# Patient Record
Sex: Female | Born: 1975 | Hispanic: No | Marital: Single | Smoking: Never smoker
Health system: Southern US, Community
[De-identification: ages and names within clinical notes are randomized; demographics above are authoritative.]

## PROBLEM LIST (undated history)

## (undated) DIAGNOSIS — E079 Disorder of thyroid, unspecified: Secondary | ICD-10-CM

## (undated) DIAGNOSIS — F329 Major depressive disorder, single episode, unspecified: Secondary | ICD-10-CM

## (undated) DIAGNOSIS — T7840XA Allergy, unspecified, initial encounter: Secondary | ICD-10-CM

## (undated) DIAGNOSIS — Z6841 Body Mass Index (BMI) 40.0 and over, adult: Secondary | ICD-10-CM

## (undated) DIAGNOSIS — G4733 Obstructive sleep apnea (adult) (pediatric): Secondary | ICD-10-CM

## (undated) DIAGNOSIS — D649 Anemia, unspecified: Secondary | ICD-10-CM

## (undated) DIAGNOSIS — I517 Cardiomegaly: Secondary | ICD-10-CM

## (undated) DIAGNOSIS — F32A Depression, unspecified: Secondary | ICD-10-CM

## (undated) DIAGNOSIS — I776 Arteritis, unspecified: Secondary | ICD-10-CM

## (undated) DIAGNOSIS — M419 Scoliosis, unspecified: Secondary | ICD-10-CM

## (undated) DIAGNOSIS — R030 Elevated blood-pressure reading, without diagnosis of hypertension: Secondary | ICD-10-CM

## (undated) DIAGNOSIS — G43709 Chronic migraine without aura, not intractable, without status migrainosus: Secondary | ICD-10-CM

## (undated) HISTORY — DX: Major depressive disorder, single episode, unspecified: F32.9

## (undated) HISTORY — DX: Depression, unspecified: F32.A

## (undated) HISTORY — PX: BARIATRIC SURGERY: SHX1103

## (undated) HISTORY — DX: Obstructive sleep apnea (adult) (pediatric): G47.33

## (undated) HISTORY — DX: Arteritis, unspecified: I77.6

## (undated) HISTORY — DX: Allergy, unspecified, initial encounter: T78.40XA

## (undated) HISTORY — DX: Elevated blood-pressure reading, without diagnosis of hypertension: R03.0

## (undated) HISTORY — DX: Morbid (severe) obesity due to excess calories: E66.01

## (undated) HISTORY — DX: Chronic migraine without aura, not intractable, without status migrainosus: G43.709

## (undated) HISTORY — DX: Body Mass Index (BMI) 40.0 and over, adult: Z684

## (undated) HISTORY — DX: Anemia, unspecified: D64.9

## (undated) HISTORY — DX: Disorder of thyroid, unspecified: E07.9

## (undated) HISTORY — PX: HERNIA REPAIR: SHX51

## (undated) HISTORY — DX: Cardiomegaly: I51.7

## (undated) HISTORY — DX: Scoliosis, unspecified: M41.9

---

## 2005-06-07 ENCOUNTER — Other Ambulatory Visit: Admission: RE | Admit: 2005-06-07 | Discharge: 2005-06-07 | Payer: Self-pay | Admitting: Gynecology

## 2010-07-23 ENCOUNTER — Emergency Department (HOSPITAL_COMMUNITY)
Admission: EM | Admit: 2010-07-23 | Discharge: 2010-07-24 | Disposition: A | Discharge status: Other Acute Inpatient Hospital | Payer: Self-pay | Source: Home / Self Care | Admitting: Emergency Medicine

## 2010-07-24 ENCOUNTER — Inpatient Hospital Stay (HOSPITAL_COMMUNITY)
Admission: EM | Admit: 2010-07-24 | Discharge: 2010-07-30 | Payer: Self-pay | Source: Home / Self Care | Attending: Psychiatry | Admitting: Psychiatry

## 2010-10-27 LAB — CBC
HCT: 35.9 % — ABNORMAL LOW (ref 36.0–46.0)
MCH: 24.1 pg — ABNORMAL LOW (ref 26.0–34.0)
MCV: 78 fL (ref 78.0–100.0)
WBC: 12.5 10*3/uL — ABNORMAL HIGH (ref 4.0–10.5)

## 2010-10-27 LAB — BASIC METABOLIC PANEL
BUN: 7 mg/dL (ref 6–23)
Creatinine, Ser: 0.77 mg/dL (ref 0.4–1.2)
GFR calc non Af Amer: >60 mL/min (ref >60)

## 2010-10-27 LAB — VITAMIN B12: Vitamin B-12: 414 pg/mL (ref 211–911)

## 2010-10-27 LAB — DIFFERENTIAL
Eosinophils Absolute: 0 10*3/uL (ref 0.0–0.7)
Lymphs Abs: 2.7 10*3/uL (ref 0.7–4.0)
Monocytes Absolute: 0.7 10*3/uL (ref 0.1–1.0)
Monocytes Relative: 6 % (ref 3–12)
Neutrophils Relative %: 72 % (ref 43–77)

## 2010-10-27 LAB — ETHANOL: Alcohol, Ethyl (B): 5 mg/dL (ref 0–10)

## 2010-10-27 LAB — RAPID URINE DRUG SCREEN, HOSP PERFORMED
Amphetamines: NOT DETECTED
Barbiturates: NOT DETECTED
Benzodiazepines: NOT DETECTED
Tetrahydrocannabinol: NOT DETECTED

## 2010-10-27 LAB — T3, FREE: T3, Free: 3 pg/mL (ref 2.3–4.2)

## 2010-10-27 LAB — URINALYSIS, ROUTINE W REFLEX MICROSCOPIC
Glucose, UA: NEGATIVE mg/dL
Hgb urine dipstick: NEGATIVE
Ketones, ur: NEGATIVE mg/dL
Protein, ur: NEGATIVE mg/dL

## 2010-10-27 LAB — URINE MICROSCOPIC-ADD ON

## 2010-10-27 LAB — TSH: TSH: 5.641 u[IU]/mL — ABNORMAL HIGH (ref 0.350–4.500)

## 2015-03-03 ENCOUNTER — Other Ambulatory Visit: Payer: Self-pay | Admitting: Orthopedic Surgery

## 2015-03-03 DIAGNOSIS — M25562 Pain in left knee: Secondary | ICD-10-CM

## 2015-03-20 ENCOUNTER — Ambulatory Visit
Admission: RE | Admit: 2015-03-20 | Discharge: 2015-03-20 | Disposition: A | Discharge status: Home / Self Care | Payer: BC Managed Care – PPO | Source: Ambulatory Visit | Attending: Orthopedic Surgery | Admitting: Orthopedic Surgery

## 2015-03-20 DIAGNOSIS — M25562 Pain in left knee: Secondary | ICD-10-CM

## 2015-07-01 ENCOUNTER — Encounter: Payer: Self-pay | Admitting: Family Medicine

## 2015-07-01 ENCOUNTER — Ambulatory Visit (INDEPENDENT_AMBULATORY_CARE_PROVIDER_SITE_OTHER): Payer: BC Managed Care – PPO | Admitting: Family Medicine

## 2015-07-01 ENCOUNTER — Other Ambulatory Visit: Payer: Self-pay | Admitting: Family Medicine

## 2015-07-01 VITALS — BP 122/80 | HR 79 | Temp 98.0°F | Resp 16 | Ht 69.0 in | Wt >= 6400 oz

## 2015-07-01 DIAGNOSIS — M4647 Discitis, unspecified, lumbosacral region: Secondary | ICD-10-CM

## 2015-07-01 DIAGNOSIS — M519 Unspecified thoracic, thoracolumbar and lumbosacral intervertebral disc disorder: Secondary | ICD-10-CM | POA: Insufficient documentation

## 2015-07-01 DIAGNOSIS — Z6841 Body Mass Index (BMI) 40.0 and over, adult: Secondary | ICD-10-CM

## 2015-07-01 DIAGNOSIS — G43009 Migraine without aura, not intractable, without status migrainosus: Secondary | ICD-10-CM

## 2015-07-01 DIAGNOSIS — G4733 Obstructive sleep apnea (adult) (pediatric): Secondary | ICD-10-CM | POA: Insufficient documentation

## 2015-07-01 DIAGNOSIS — Z8701 Personal history of pneumonia (recurrent): Secondary | ICD-10-CM

## 2015-07-01 DIAGNOSIS — M5430 Sciatica, unspecified side: Secondary | ICD-10-CM | POA: Insufficient documentation

## 2015-07-01 DIAGNOSIS — E66813 Obesity, class 3: Secondary | ICD-10-CM | POA: Insufficient documentation

## 2015-07-01 DIAGNOSIS — F3342 Major depressive disorder, recurrent, in full remission: Secondary | ICD-10-CM

## 2015-07-01 DIAGNOSIS — D509 Iron deficiency anemia, unspecified: Secondary | ICD-10-CM | POA: Insufficient documentation

## 2015-07-01 DIAGNOSIS — E038 Other specified hypothyroidism: Secondary | ICD-10-CM | POA: Insufficient documentation

## 2015-07-01 DIAGNOSIS — J309 Allergic rhinitis, unspecified: Secondary | ICD-10-CM

## 2015-07-01 DIAGNOSIS — M5431 Sciatica, right side: Secondary | ICD-10-CM

## 2015-07-01 DIAGNOSIS — E039 Hypothyroidism, unspecified: Secondary | ICD-10-CM | POA: Insufficient documentation

## 2015-07-01 DIAGNOSIS — G43001 Migraine without aura, not intractable, with status migrainosus: Secondary | ICD-10-CM | POA: Insufficient documentation

## 2015-07-01 HISTORY — DX: Morbid (severe) obesity due to excess calories: E66.01

## 2015-07-01 MED ORDER — TRAMADOL HCL 50 MG PO TABS: 50.0000 mg | ORAL_TABLET | Freq: Four times a day (QID) | ORAL | Status: DC | PRN

## 2015-07-01 MED ORDER — GABAPENTIN 100 MG PO CAPS: 100.0000 mg | ORAL_CAPSULE | Freq: Three times a day (TID) | ORAL | Status: DC

## 2015-07-01 NOTE — Patient Instructions (Signed)
Sciatica With Rehab The sciatic nerve runs from the back down the leg and is responsible for sensation and control of the muscles in the back (posterior) side of the thigh, lower leg, and foot. Sciatica is a condition that is characterized by inflammation of this nerve.  SYMPTOMS   Signs of nerve damage, including numbness and/or weakness along the posterior side of the lower extremity.  Pain in the back of the thigh that may also travel down the leg.  Pain that worsens when sitting for long periods of time.  Occasionally, pain in the back or buttock. CAUSES  Inflammation of the sciatic nerve is the cause of sciatica. The inflammation is due to something irritating the nerve. Common sources of irritation include:  Sitting for long periods of time.  Direct trauma to the nerve.  Arthritis of the spine.  Herniated or ruptured disk.  Slipping of the vertebrae (spondylolisthesis).  Pressure from soft tissues, such as muscles or ligament-like tissue (fascia). RISK INCREASES WITH:  Sports that place pressure or stress on the spine (football or weightlifting).  Poor strength and flexibility.  Failure to warm up properly before activity.  Family history of low back pain or disk disorders.  Previous back injury or surgery.  Poor body mechanics, especially when lifting, or poor posture. PREVENTION   Warm up and stretch properly before activity.  Maintain physical fitness:  Strength, flexibility, and endurance.  Cardiovascular fitness.  Learn and use proper technique, especially with posture and lifting. When possible, have coach correct improper technique.  Avoid activities that place stress on the spine. PROGNOSIS If treated properly, then sciatica usually resolves within 6 weeks. However, occasionally surgery is necessary.  RELATED COMPLICATIONS   Permanent nerve damage, including pain, numbness, tingle, or weakness.  Chronic back pain.  Risks of surgery: infection,  bleeding, nerve damage, or damage to surrounding tissues. TREATMENT Treatment initially involves resting from any activities that aggravate your symptoms. The use of ice and medication may help reduce pain and inflammation. The use of strengthening and stretching exercises may help reduce pain with activity. These exercises may be performed at home or with referral to a therapist. A therapist may recommend further treatments, such as transcutaneous electronic nerve stimulation (TENS) or ultrasound. Your caregiver may recommend corticosteroid injections to help reduce inflammation of the sciatic nerve. If symptoms persist despite non-surgical (conservative) treatment, then surgery may be recommended. MEDICATION  If pain medication is necessary, then nonsteroidal anti-inflammatory medications, such as aspirin and ibuprofen, or other minor pain relievers, such as acetaminophen, are often recommended.  Do not take pain medication for 7 days before surgery.  Prescription pain relievers may be given if deemed necessary by your caregiver. Use only as directed and only as much as you need.  Ointments applied to the skin may be helpful.  Corticosteroid injections may be given by your caregiver. These injections should be reserved for the most serious cases, because they may only be given a certain number of times. HEAT AND COLD  Cold treatment (icing) relieves pain and reduces inflammation. Cold treatment should be applied for 10 to 15 minutes every 2 to 3 hours for inflammation and pain and immediately after any activity that aggravates your symptoms. Use ice packs or massage the area with a piece of ice (ice massage).  Heat treatment may be used prior to performing the stretching and strengthening activities prescribed by your caregiver, physical therapist, or athletic trainer. Use a heat pack or soak the injury in warm water.   SEEK MEDICAL CARE IF:  Treatment seems to offer no benefit, or the condition  worsens.  Any medications produce adverse side effects. EXERCISES  RANGE OF MOTION (ROM) AND STRETCHING EXERCISES - Sciatica Most people with sciatic will find that their symptoms worsen with either excessive bending forward (flexion) or arching at the low back (extension). The exercises which will help resolve your symptoms will focus on the opposite motion. Your physician, physical therapist or athletic trainer will help you determine which exercises will be most helpful to resolve your low back pain. Do not complete any exercises without first consulting with your clinician. Discontinue any exercises which worsen your symptoms until you speak to your clinician. If you have pain, numbness or tingling which travels down into your buttocks, leg or foot, the goal of the therapy is for these symptoms to move closer to your back and eventually resolve. Occasionally, these leg symptoms will get better, but your low back pain may worsen; this is typically an indication of progress in your rehabilitation. Be certain to be very alert to any changes in your symptoms and the activities in which you participated in the 24 hours prior to the change. Sharing this information with your clinician will allow him/her to most efficiently treat your condition. These exercises may help you when beginning to rehabilitate your injury. Your symptoms may resolve with or without further involvement from your physician, physical therapist or athletic trainer. While completing these exercises, remember:   Restoring tissue flexibility helps normal motion to return to the joints. This allows healthier, less painful movement and activity.  An effective stretch should be held for at least 30 seconds.  A stretch should never be painful. You should only feel a gentle lengthening or release in the stretched tissue. FLEXION RANGE OF MOTION AND STRETCHING EXERCISES: STRETCH - Flexion, Single Knee to Chest   Lie on a firm bed or floor  with both legs extended in front of you.  Keeping one leg in contact with the floor, bring your opposite knee to your chest. Hold your leg in place by either grabbing behind your thigh or at your knee.  Pull until you feel a gentle stretch in your low back. Hold __________ seconds.  Slowly release your grasp and repeat the exercise with the opposite side. Repeat __________ times. Complete this exercise __________ times per day.  STRETCH - Flexion, Double Knee to Chest  Lie on a firm bed or floor with both legs extended in front of you.  Keeping one leg in contact with the floor, bring your opposite knee to your chest.  Tense your stomach muscles to support your back and then lift your other knee to your chest. Hold your legs in place by either grabbing behind your thighs or at your knees.  Pull both knees toward your chest until you feel a gentle stretch in your low back. Hold __________ seconds.  Tense your stomach muscles and slowly return one leg at a time to the floor. Repeat __________ times. Complete this exercise __________ times per day.  STRETCH - Low Trunk Rotation   Lie on a firm bed or floor. Keeping your legs in front of you, bend your knees so they are both pointed toward the ceiling and your feet are flat on the floor.  Extend your arms out to the side. This will stabilize your upper body by keeping your shoulders in contact with the floor.  Gently and slowly drop both knees together to one side until   you feel a gentle stretch in your low back. Hold for __________ seconds.  Tense your stomach muscles to support your low back as you bring your knees back to the starting position. Repeat the exercise to the other side. Repeat __________ times. Complete this exercise __________ times per day  EXTENSION RANGE OF MOTION AND FLEXIBILITY EXERCISES: STRETCH - Extension, Prone on Elbows  Lie on your stomach on the floor, a bed will be too soft. Place your palms about shoulder  width apart and at the height of your head.  Place your elbows under your shoulders. If this is too painful, stack pillows under your chest.  Allow your body to relax so that your hips drop lower and make contact more completely with the floor.  Hold this position for __________ seconds.  Slowly return to lying flat on the floor. Repeat __________ times. Complete this exercise __________ times per day.  RANGE OF MOTION - Extension, Prone Press Ups  Lie on your stomach on the floor, a bed will be too soft. Place your palms about shoulder width apart and at the height of your head.  Keeping your back as relaxed as possible, slowly straighten your elbows while keeping your hips on the floor. You may adjust the placement of your hands to maximize your comfort. As you gain motion, your hands will come more underneath your shoulders.  Hold this position __________ seconds.  Slowly return to lying flat on the floor. Repeat __________ times. Complete this exercise __________ times per day.  STRENGTHENING EXERCISES - Sciatica  These exercises may help you when beginning to rehabilitate your injury. These exercises should be done near your "sweet spot." This is the neutral, low-back arch, somewhere between fully rounded and fully arched, that is your least painful position. When performed in this safe range of motion, these exercises can be used for people who have either a flexion or extension based injury. These exercises may resolve your symptoms with or without further involvement from your physician, physical therapist or athletic trainer. While completing these exercises, remember:   Muscles can gain both the endurance and the strength needed for everyday activities through controlled exercises.  Complete these exercises as instructed by your physician, physical therapist or athletic trainer. Progress with the resistance and repetition exercises only as your caregiver advises.  You may  experience muscle soreness or fatigue, but the pain or discomfort you are trying to eliminate should never worsen during these exercises. If this pain does worsen, stop and make certain you are following the directions exactly. If the pain is still present after adjustments, discontinue the exercise until you can discuss the trouble with your clinician. STRENGTHENING - Deep Abdominals, Pelvic Tilt   Lie on a firm bed or floor. Keeping your legs in front of you, bend your knees so they are both pointed toward the ceiling and your feet are flat on the floor.  Tense your lower abdominal muscles to press your low back into the floor. This motion will rotate your pelvis so that your tail bone is scooping upwards rather than pointing at your feet or into the floor.  With a gentle tension and even breathing, hold this position for __________ seconds. Repeat __________ times. Complete this exercise __________ times per day.  STRENGTHENING - Abdominals, Crunches   Lie on a firm bed or floor. Keeping your legs in front of you, bend your knees so they are both pointed toward the ceiling and your feet are flat on the   floor. Cross your arms over your chest.  Slightly tip your chin down without bending your neck.  Tense your abdominals and slowly lift your trunk high enough to just clear your shoulder blades. Lifting higher can put excessive stress on the low back and does not further strengthen your abdominal muscles.  Control your return to the starting position. Repeat __________ times. Complete this exercise __________ times per day.  STRENGTHENING - Quadruped, Opposite UE/LE Lift  Assume a hands and knees position on a firm surface. Keep your hands under your shoulders and your knees under your hips. You may place padding under your knees for comfort.  Find your neutral spine and gently tense your abdominal muscles so that you can maintain this position. Your shoulders and hips should form a rectangle  that is parallel with the floor and is not twisted.  Keeping your trunk steady, lift your right hand no higher than your shoulder and then your left leg no higher than your hip. Make sure you are not holding your breath. Hold this position __________ seconds.  Continuing to keep your abdominal muscles tense and your back steady, slowly return to your starting position. Repeat with the opposite arm and leg. Repeat __________ times. Complete this exercise __________ times per day.  STRENGTHENING - Abdominals and Quadriceps, Straight Leg Raise   Lie on a firm bed or floor with both legs extended in front of you.  Keeping one leg in contact with the floor, bend the other knee so that your foot can rest flat on the floor.  Find your neutral spine, and tense your abdominal muscles to maintain your spinal position throughout the exercise.  Slowly lift your straight leg off the floor about 6 inches for a count of 15, making sure to not hold your breath.  Still keeping your neutral spine, slowly lower your leg all the way to the floor. Repeat this exercise with each leg __________ times. Complete this exercise __________ times per day. POSTURE AND BODY MECHANICS CONSIDERATIONS - Sciatica Keeping correct posture when sitting, standing or completing your activities will reduce the stress put on different body tissues, allowing injured tissues a chance to heal and limiting painful experiences. The following are general guidelines for improved posture. Your physician or physical therapist will provide you with any instructions specific to your needs. While reading these guidelines, remember:  The exercises prescribed by your provider will help you have the flexibility and strength to maintain correct postures.  The correct posture provides the optimal environment for your joints to work. All of your joints have less wear and tear when properly supported by a spine with good posture. This means you will  experience a healthier, less painful body.  Correct posture must be practiced with all of your activities, especially prolonged sitting and standing. Correct posture is as important when doing repetitive low-stress activities (typing) as it is when doing a single heavy-load activity (lifting). RESTING POSITIONS Consider which positions are most painful for you when choosing a resting position. If you have pain with flexion-based activities (sitting, bending, stooping, squatting), choose a position that allows you to rest in a less flexed posture. You would want to avoid curling into a fetal position on your side. If your pain worsens with extension-based activities (prolonged standing, working overhead), avoid resting in an extended position such as sleeping on your stomach. Most people will find more comfort when they rest with their spine in a more neutral position, neither too rounded nor too   arched. Lying on a non-sagging bed on your side with a pillow between your knees, or on your back with a pillow under your knees will often provide some relief. Keep in mind, being in any one position for a prolonged period of time, no matter how correct your posture, can still lead to stiffness. PROPER SITTING POSTURE In order to minimize stress and discomfort on your spine, you must sit with correct posture Sitting with good posture should be effortless for a healthy body. Returning to good posture is a gradual process. Many people can work toward this most comfortably by using various supports until they have the flexibility and strength to maintain this posture on their own. When sitting with proper posture, your ears will fall over your shoulders and your shoulders will fall over your hips. You should use the back of the chair to support your upper back. Your low back will be in a neutral position, just slightly arched. You may place a small pillow or folded towel at the base of your low back for support.  When  working at a desk, create an environment that supports good, upright posture. Without extra support, muscles fatigue and lead to excessive strain on joints and other tissues. Keep these recommendations in mind: CHAIR:   A chair should be able to slide under your desk when your back makes contact with the back of the chair. This allows you to work closely.  The chair's height should allow your eyes to be level with the upper part of your monitor and your hands to be slightly lower than your elbows. BODY POSITION  Your feet should make contact with the floor. If this is not possible, use a foot rest.  Keep your ears over your shoulders. This will reduce stress on your neck and low back. INCORRECT SITTING POSTURES   If you are feeling tired and unable to assume a healthy sitting posture, do not slouch or slump. This puts excessive strain on your back tissues, causing more damage and pain. Healthier options include:  Using more support, like a lumbar pillow.  Switching tasks to something that requires you to be upright or walking.  Talking a brief walk.  Lying down to rest in a neutral-spine position. PROLONGED STANDING WHILE SLIGHTLY LEANING FORWARD  When completing a task that requires you to lean forward while standing in one place for a long time, place either foot up on a stationary 2-4 inch high object to help maintain the best posture. When both feet are on the ground, the low back tends to lose its slight inward curve. If this curve flattens (or becomes too large), then the back and your other joints will experience too much stress, fatigue more quickly and can cause pain.  CORRECT STANDING POSTURES Proper standing posture should be assumed with all daily activities, even if they only take a few moments, like when brushing your teeth. As in sitting, your ears should fall over your shoulders and your shoulders should fall over your hips. You should keep a slight tension in your abdominal  muscles to brace your spine. Your tailbone should point down to the ground, not behind your body, resulting in an over-extended swayback posture.  INCORRECT STANDING POSTURES  Common incorrect standing postures include a forward head, locked knees and/or an excessive swayback. WALKING Walk with an upright posture. Your ears, shoulders and hips should all line-up. PROLONGED ACTIVITY IN A FLEXED POSITION When completing a task that requires you to bend forward   at your waist or lean over a low surface, try to find a way to stabilize 3 of 4 of your limbs. You can place a hand or elbow on your thigh or rest a knee on the surface you are reaching across. This will provide you more stability so that your muscles do not fatigue as quickly. By keeping your knees relaxed, or slightly bent, you will also reduce stress across your low back. CORRECT LIFTING TECHNIQUES DO :   Assume a wide stance. This will provide you more stability and the opportunity to get as close as possible to the object which you are lifting.  Tense your abdominals to brace your spine; then bend at the knees and hips. Keeping your back locked in a neutral-spine position, lift using your leg muscles. Lift with your legs, keeping your back straight.  Test the weight of unknown objects before attempting to lift them.  Try to keep your elbows locked down at your sides in order get the best strength from your shoulders when carrying an object.  Always ask for help when lifting heavy or awkward objects. INCORRECT LIFTING TECHNIQUES DO NOT:   Lock your knees when lifting, even if it is a small object.  Bend and twist. Pivot at your feet or move your feet when needing to change directions.  Assume that you cannot safely pick up a paperclip without proper posture.   This information is not intended to replace advice given to you by your health care provider. Make sure you discuss any questions you have with your health care provider.     Document Released: 08/02/2005 Document Revised: 12/17/2014 Document Reviewed: 11/14/2008 [employer] Patient Education 2016 [employer]  

## 2015-07-01 NOTE — Progress Notes (Signed)
Name: Melinda Fox   MRN: [id number]    DOB: Jan 16, 1976   Date:07/01/2015       Progress Note  Subjective  Chief Complaint  Chief Complaint  Patient presents with  . Sciatica  . Leg Pain    bilateral leg pain that has some numbness and tingling that radiates to her ankles.     HPI  Melinda Fox is a 39 year old female with morbid obesity, allergic rhinitis, frequent sinus infections, migraine headaches, subclinical hypothyroidism here today to discuss her long standing joint pain issues in lower weight bearing joints such as lower back and knees. Pain described as aching with a shooting pain down the lateral hips and thighs to anterior knee. She has seen an orthopedic specialist regarding her knee pain, L>R, subsequently MRI done Summer 2016 with no significant findings requiring surgical intervention. She was recommended to attend PT but was not able to schedule this. She continues to use Indomethacin or Advil PRN but today reports that she is driving a long distance for her upcoming vacation and is worried she will have worsening joint pain that will limit her. She has tried Vicodin and Percocet in the past and did not like how they made her feel. She has used Tramadol safely and is willing to try alternative therapies. Melinda Fox also confirms that she has tried home exercises, direct heat and ice, swiming at the [facility] pool with some modest benefits but not sustained relief.   Past Medical History  Diagnosis Date  . Anemia   . Thyroid disease   . Depression   . Chronic migraine without aura without status migrainosus, not intractable   . Sleep apnea, obstructive   . Allergy   . Scoliosis   . Elevated blood-pressure reading without diagnosis of hypertension     Patient Active Problem List   Diagnosis Date Noted  . Iron deficiency anemia 07/01/2015  . Migraine without aura and without status migrainosus, not intractable 07/01/2015  . Major depression, recurrent, full  remission (HCC) 07/01/2015  . Obstructive sleep apnea 07/01/2015  . Subclinical hypothyroidism 07/01/2015  . History of pneumonia 07/01/2015  . Allergic rhinitis 07/01/2015  . Lumbosacral disc disease 07/01/2015  . Sciatica neuralgia 07/01/2015    Social History  Substance Use Topics  . Smoking status: Never Smoker   . Smokeless tobacco: Not on file  . Alcohol Use: 0.0 oz/week    0 Standard drinks or equivalent per week     Current outpatient prescriptions:  .  indomethacin (INDOCIN) 50 MG capsule, Take 1 capsule by mouth 2 (two) times daily as needed., Disp: , Rfl: 1 .  gabapentin (NEURONTIN) 100 MG capsule, Take 1 capsule (100 mg total) by mouth 3 (three) times daily., Disp: 90 capsule, Rfl: 3 .  traMADol (ULTRAM) 50 MG tablet, Take 1 tablet (50 mg total) by mouth every 6 (six) hours as needed for moderate pain or severe pain., Disp: 60 tablet, Rfl: 0  History reviewed. No pertinent past surgical history.  Family History  Problem Relation Age of Onset  . Family history unknown: Yes    Allergies  Allergen Reactions  . Sulfa Antibiotics Other (See Comments)    Was told that she had it as a child     Review of Systems  CONSTITUTIONAL: No significant weight changes, fever, chills, weakness or fatigue.   CARDIOVASCULAR: No chest pain, chest pressure or chest discomfort. No palpitations or edema.  RESPIRATORY: No shortness of breath, cough or sputum.  NEUROLOGICAL: No headache,  dizziness, syncope, paralysis, ataxia. Yes numbness or tingling in the lower extremities up to knees. No memory changes. No change in bowel or bladder control.  MUSCULOSKELETAL: Yes joint pain. No muscle pain. PSYCHIATRIC: No change in mood. No change in sleep pattern.  ENDOCRINOLOGIC: No reports of sweating, cold or heat intolerance. No polyuria or polydipsia.     Objective  Blood pressure 122/80, pulse 79, temperature 98 F (36.7 C), temperature source Oral, resp. rate 16, height 5\' 9"  (1.753  m), weight 404 lb 12.8 oz (183.616 kg), SpO2 95 %.  Physical Exam  Constitutional: Patient morbidly obese. In no distress.  Cardiovascular: Normal rate, regular rhythm and normal heart sounds.  No murmur heard.  Pulmonary/Chest: Effort normal and breath sounds normal. No respiratory distress. Musculoskeletal: Normal range of motion bilateral UE and LE, no joint effusions. Bilateral knee exam, extensive soft tissue L > R, no crepitus noted on exam. Lumbar spine no palpable step off, patient notes tenderness on deep palpation just right of L5 area. Peripheral vascular: Bilateral LE no edema. Neurological: CN II-XII grossly intact with no focal deficits. Alert and oriented to person, place, and time. Coordination, balance, strength, speech and gait are normal.  Skin: Skin is warm and dry. No rash noted. No erythema.  Psychiatric: Patient has a stable mood and affect. Behavior is normal in office today. Judgment and thought content normal in office today.  Assessment & Plan  1. Lumbosacral disc disease We discussed potential pathology and long term risk of reoccurrence. Maintaining an ideal body habitus, regular exercise, proper lifting techniques and mindfulness of exacerbating factors will be useful in long term management.  Instructed on use of heating pad with exercises. Consider concomitant therapy with PT, massage therapist or chiropractor. May use anti-inflammatory medication and muscle relaxer as needed. Trial of tramadol for severe pain, start gabapentin one at bed time and work up to TID dose. The patient has been counseled on the proper use, side effects and potential interactions of the new medication. Patient encouraged to review the side effects and safety profile pamphlet provided with the prescription from the pharmacy as well as request counseling from the pharmacy team as needed.    - gabapentin (NEURONTIN) 100 MG capsule; Take 1 capsule (100 mg total) by mouth 3 (three) times daily.   Dispense: 90 capsule; Refill: 3 - traMADol (ULTRAM) 50 MG tablet; Take 1 tablet (50 mg total) by mouth every 6 (six) hours as needed for moderate pain or severe pain.  Dispense: 60 tablet; Refill: 0 - DG Lumbar Spine Complete; Future  2. Sciatica neuralgia, right See AP number 1.  - gabapentin (NEURONTIN) 100 MG capsule; Take 1 capsule (100 mg total) by mouth 3 (three) times daily.  Dispense: 90 capsule; Refill: 3 - traMADol (ULTRAM) 50 MG tablet; Take 1 tablet (50 mg total) by mouth every 6 (six) hours as needed for moderate pain or severe pain.  Dispense: 60 tablet; Refill: 0 - DG Lumbar Spine Complete; Future  3. Morbid obesity with BMI of 50.0-59.9, adult [facility](HCC) Clearly her joint problems are directly related to her large body habitus. Melinda Fox can be defensive and resistant to advise at times. Really has not made any consistent strides for effective weight loss since I have known her. Even if she is considering surgical intervention for her joint issues in the future her body habitus may be a hurdle. Again weight loss is recommended.

## 2015-07-02 ENCOUNTER — Ambulatory Visit
Admission: RE | Admit: 2015-07-02 | Discharge: 2015-07-02 | Disposition: A | Discharge status: Home / Self Care | Payer: BC Managed Care – PPO | Source: Ambulatory Visit | Attending: Family Medicine | Admitting: Family Medicine

## 2015-07-02 DIAGNOSIS — M4647 Discitis, unspecified, lumbosacral region: Secondary | ICD-10-CM | POA: Diagnosis present

## 2015-07-02 DIAGNOSIS — M5431 Sciatica, right side: Secondary | ICD-10-CM

## 2015-07-02 DIAGNOSIS — M5137 Other intervertebral disc degeneration, lumbosacral region: Secondary | ICD-10-CM | POA: Diagnosis not present

## 2015-07-02 DIAGNOSIS — M519 Unspecified thoracic, thoracolumbar and lumbosacral intervertebral disc disorder: Secondary | ICD-10-CM

## 2015-08-01 ENCOUNTER — Encounter: Payer: Self-pay | Admitting: Family Medicine

## 2015-08-01 ENCOUNTER — Ambulatory Visit (INDEPENDENT_AMBULATORY_CARE_PROVIDER_SITE_OTHER): Payer: BC Managed Care – PPO | Admitting: Family Medicine

## 2015-08-01 VITALS — BP 122/76 | HR 77 | Temp 97.9°F | Resp 16 | Ht 69.0 in | Wt >= 6400 oz

## 2015-08-01 DIAGNOSIS — J0101 Acute recurrent maxillary sinusitis: Secondary | ICD-10-CM

## 2015-08-01 DIAGNOSIS — J40 Bronchitis, not specified as acute or chronic: Secondary | ICD-10-CM

## 2015-08-01 MED ORDER — HYDROCOD POLST-CPM POLST ER 10-8 MG/5ML PO SUER: 5.0000 mL | Freq: Two times a day (BID) | ORAL | Status: DC | PRN

## 2015-08-01 MED ORDER — AMOXICILLIN-POT CLAVULANATE 875-125 MG PO TABS: 1.0000 | ORAL_TABLET | Freq: Two times a day (BID) | ORAL | Status: DC

## 2015-08-01 NOTE — Progress Notes (Signed)
Name: Melinda Fox   MRN: [id number]    DOB: December 08, 1975   Date:08/01/2015       Progress Note  Subjective  Chief Complaint  Chief Complaint  Patient presents with  . URI    for 5 days, headache, cough, congestion, sneezing, runny nose    HPI  Sinusitis  Patient presents with greater than 7 day history of nasal congestion and drainage which is purulent in color. There is tenderness over the sinuses. There has been fever to 0 along with some associated chills on occasion. Usage of over-the-counter medications is not been affected. There is also accompanying cough productive of purulent sputum.  Bronchitis  Patient presents with a greater than 1 week history of cough productive of purulent sputum. The cough is irritating and keep the patient awake at night. There has no fever or chills.  Over-the-counter meds And completely effective.  Past Medical History  Diagnosis Date  . Anemia   . Thyroid disease   . Depression   . Chronic migraine without aura without status migrainosus, not intractable   . Sleep apnea, obstructive   . Allergy   . Scoliosis   . Elevated blood-pressure reading without diagnosis of hypertension     Social History  Substance Use Topics  . Smoking status: Never Smoker   . Smokeless tobacco: Not on file  . Alcohol Use: 0.0 oz/week    0 Standard drinks or equivalent per week     Current outpatient prescriptions:  .  gabapentin (NEURONTIN) 100 MG capsule, Take 1 capsule (100 mg total) by mouth 3 (three) times daily., Disp: 90 capsule, Rfl: 3 .  indomethacin (INDOCIN) 50 MG capsule, Take 1 capsule by mouth 2 (two) times daily as needed., Disp: , Rfl: 1 .  traMADol (ULTRAM) 50 MG tablet, Take 1 tablet (50 mg total) by mouth every 6 (six) hours as needed for moderate pain or severe pain., Disp: 60 tablet, Rfl: 0  Allergies  Allergen Reactions  . Sulfa Antibiotics Other (See Comments)    Was told that she had it as a child    Review of Systems   HENT: Positive for congestion.   Respiratory: Positive for cough and sputum production.      Objective  Filed Vitals:   08/01/15 0740  BP: 122/76  Pulse: 77  Temp: 97.9 F (36.6 C)  TempSrc: Oral  Resp: 16  Height: 5\' 9"  (1.753 m)  Weight: 405 lb (183.707 kg)  SpO2: 97%     Physical Exam  Constitutional: She is oriented to person, place, and time.  Obesity no acute distress.  HENT:  Head: Normocephalic.  Nasal turbinate swelling with purulent discharge  Eyes: EOM are normal. Pupils are equal, round, and reactive to light.  Neck: Normal range of motion. No thyromegaly present.  Cardiovascular: Normal rate, regular rhythm and normal heart sounds.   No murmur heard. Pulmonary/Chest: Effort normal and breath sounds normal.  Musculoskeletal: Normal range of motion. She exhibits no edema.  Neurological: She is alert and oriented to person, place, and time. No cranial nerve deficit. Gait normal.  Skin: Skin is warm and dry. No rash noted.  Psychiatric: Memory and affect normal.      Assessment & Plan   1. Acute recurrent maxillary sinusitis Prescription is given for Augmentin understanding that she will only take it if her symptoms persist beyond one week or she runs fever over 100  2. Bronchitis Tussionex for cough

## 2015-09-10 ENCOUNTER — Other Ambulatory Visit: Payer: Self-pay | Admitting: Family Medicine

## 2015-09-10 ENCOUNTER — Telehealth: Payer: Self-pay | Admitting: Family Medicine

## 2015-09-10 MED ORDER — AMOXICILLIN-POT CLAVULANATE 875-125 MG PO TABS: 1.0000 | ORAL_TABLET | Freq: Two times a day (BID) | ORAL | Status: DC

## 2015-09-10 NOTE — Telephone Encounter (Signed)
Augmentin sent to pharmacy.

## 2015-09-10 NOTE — Telephone Encounter (Signed)
Pt states she has had a headache,ears clogged, runny nose,congested and wants to know if something can be called into [facility] [address]

## 2015-09-11 NOTE — Telephone Encounter (Signed)
LMOM to inform pt RX at pharmacy °

## 2015-10-07 ENCOUNTER — Other Ambulatory Visit: Payer: Self-pay | Admitting: Family Medicine

## 2015-10-07 ENCOUNTER — Ambulatory Visit (INDEPENDENT_AMBULATORY_CARE_PROVIDER_SITE_OTHER): Payer: BC Managed Care – PPO | Admitting: Family Medicine

## 2015-10-07 ENCOUNTER — Encounter: Payer: Self-pay | Admitting: Family Medicine

## 2015-10-07 VITALS — BP 138/82 | HR 109 | Temp 98.1°F | Resp 20 | Wt >= 6400 oz

## 2015-10-07 DIAGNOSIS — D509 Iron deficiency anemia, unspecified: Secondary | ICD-10-CM | POA: Diagnosis not present

## 2015-10-07 DIAGNOSIS — E038 Other specified hypothyroidism: Secondary | ICD-10-CM

## 2015-10-07 DIAGNOSIS — E039 Hypothyroidism, unspecified: Secondary | ICD-10-CM

## 2015-10-07 DIAGNOSIS — R51 Headache: Secondary | ICD-10-CM | POA: Diagnosis not present

## 2015-10-07 DIAGNOSIS — R519 Headache, unspecified: Secondary | ICD-10-CM

## 2015-10-07 DIAGNOSIS — R5383 Other fatigue: Secondary | ICD-10-CM | POA: Insufficient documentation

## 2015-10-07 MED ORDER — INDOMETHACIN 50 MG PO CAPS: 50.0000 mg | ORAL_CAPSULE | Freq: Three times a day (TID) | ORAL | Status: DC | PRN

## 2015-10-07 MED ORDER — BUTALBITAL-APAP-CAFFEINE 50-325-40 MG PO TABS: 1.0000 | ORAL_TABLET | Freq: Four times a day (QID) | ORAL | Status: AC | PRN

## 2015-10-07 NOTE — Progress Notes (Signed)
Name: Melinda Fox   MRN: [id number]    DOB: [birth year]   Date:10/07/2015       Progress Note  Subjective  Chief Complaint  Chief Complaint  Patient presents with  . Headache  . Fatigue    HPI  Melinda Fox is a 40 year old female with morbid obesity, allergic rhinitis, frequent sinus infections, migraine headaches, subclinical hypothyroidism, iron def anemia here today to discuss her headaches. Pain described as aching with a shooting pain frontal then radiating back. Onset just around new year's eve. Associated with frequent infections, URIs, sinusitis, possibly the flu. Now she is not having flu like symptoms or diarrhea but she did a week ago. Recently finished 2 rounds of Augmentin for sinus infection about 2 weeks ago. She notes sensitivity to noise and notes this headache is not like her typical migraine headaches. This headache is 4/10 in scale but her migraines are usually 10/10. No nausea, vomiting, numbness, epigastric pain, blurry vision. Associated with fatigue and ear pain. Has tried tylenol for her headaches with modest relief however symptoms return.  Past Medical History  Diagnosis Date  . Anemia   . Thyroid disease   . Depression   . Chronic migraine without aura without status migrainosus, not intractable   . Sleep apnea, obstructive   . Allergy   . Scoliosis   . Elevated blood-pressure reading without diagnosis of hypertension     Patient Active Problem List   Diagnosis Date Noted  . Iron deficiency anemia 07/01/2015  . Migraine without aura and without status migrainosus, not intractable 07/01/2015  . Major depression, recurrent, full remission (HCC) 07/01/2015  . Obstructive sleep apnea 07/01/2015  . Subclinical hypothyroidism 07/01/2015  . History of pneumonia 07/01/2015  . Allergic rhinitis 07/01/2015  . Lumbosacral disc disease 07/01/2015  . Sciatica neuralgia 07/01/2015  . Morbid obesity with BMI of 50.0-59.9, adult (HCC) 07/01/2015     Social History  Substance Use Topics  . Smoking status: Never Smoker   . Smokeless tobacco: Not on file  . Alcohol Use: 0.0 oz/week    0 Standard drinks or equivalent per week     Current outpatient prescriptions:  .  gabapentin (NEURONTIN) 100 MG capsule, Take 1 capsule (100 mg total) by mouth 3 (three) times daily., Disp: 90 capsule, Rfl: 3 .  traMADol (ULTRAM) 50 MG tablet, Take 1 tablet (50 mg total) by mouth every 6 (six) hours as needed for moderate pain or severe pain., Disp: 60 tablet, Rfl: 0  No past surgical history on file.  Family History  Problem Relation Age of Onset  . Family history unknown: Yes    Allergies  Allergen Reactions  . Sulfa Antibiotics Other (See Comments)    Was told that she had it as a child     Review of Systems  CONSTITUTIONAL: No significant weight changes, fever, chills, weakness. Yes fatigue.  HEENT:  - Eyes: No visual changes.  - Ears: No auditory changes. No pain.  - Nose: No sneezing, congestion, runny nose. - Throat: No sore throat. No changes in swallowing. SKIN: No rash or itching.  CARDIOVASCULAR: No chest pain, chest pressure or chest discomfort. No palpitations or edema.  RESPIRATORY: No shortness of breath, cough or sputum.  NEUROLOGICAL: Yes headache. No dizziness, syncope, paralysis, ataxia, numbness or tingling in the extremities. No memory changes. No change in bowel or bladder control.  MUSCULOSKELETAL: No joint pain. No muscle pain. HEMATOLOGIC: No anemia, bleeding or bruising.  LYMPHATICS: No enlarged lymph nodes.  PSYCHIATRIC: No change in mood. No change in sleep pattern.  ENDOCRINOLOGIC: No reports of sweating, cold or heat intolerance. No polyuria or polydipsia.     Objective  BP 138/82 mmHg  Pulse 109  Temp(Src) 98.1 F (36.7 C)  Resp 20  Wt 406 lb 8 oz (184.387 kg)  SpO2 97% Body mass index is 60 kg/(m^2).  Physical Exam  Constitutional: Patient is morbidly obese. In no distress but does  appear to be uncomfortable from headache. HEENT:  - Head: Normocephalic and atraumatic.  - Ears: Bilateral TMs gray, no erythema or effusion - Nose: Nasal mucosa moist, congested.  - Mouth/Throat: Oropharynx is clear and moist. No tonsillar hypertrophy or erythema. No post nasal drainage.  - Eyes: Conjunctivae clear, EOM movements normal. PERRLA. No scleral icterus.  Neck: Normal range of motion. Neck supple. No JVD present. No thyromegaly present.  Cardiovascular: Normal rate, regular rhythm and normal heart sounds.  No murmur heard.  Pulmonary/Chest: Effort normal and breath sounds normal. No respiratory distress. Neurological: CN II-XII grossly intact with no focal deficits. Alert and oriented to person, place, and time. Coordination, balance, strength, speech and gait are normal.  Skin: Skin is warm and dry. No rash noted. No erythema.  Psychiatric: Patient has a stable mood and affect. Behavior is normal in office today. Judgment and thought content normal in office today.   Assessment & Plan  1. Headache, unspecified headache type Etiology unclear. Will get blood work as well as start her on Fioricet. Encouraged her to try Indomethacin with food this time as in the past it has helped. She declined prednisone therapy.  - butalbital-acetaminophen-caffeine (FIORICET) 50-325-40 MG tablet; Take 1-2 tablets by mouth every 6 (six) hours as needed for headache.  Dispense: 30 tablet; Refill: 1 - indomethacin (INDOCIN) 50 MG capsule; Take 1 capsule (50 mg total) by mouth 3 (three) times daily as needed.  Dispense: 40 capsule; Refill: 1 - CBC with Differential/Platelet - Comprehensive metabolic panel  2. Iron deficiency anemia  - CBC with Differential/Platelet - Comprehensive metabolic panel - Iron and TIBC - Ferritin - Iron  3. Other fatigue  - CBC with Differential/Platelet - Comprehensive metabolic panel  4. Subclinical hypothyroidism  - TSH - T3, free - T4, free

## 2015-10-08 LAB — COMPREHENSIVE METABOLIC PANEL
ALBUMIN: 4.3 g/dL (ref 3.5–5.5)
ALK PHOS: 95 IU/L (ref 39–117)
ALT: 25 IU/L (ref 0–32)
AST: 16 IU/L (ref 0–40)
Albumin/Globulin Ratio: 1.5 (ref 1.1–2.5)
BUN / CREAT RATIO: 16 (ref 8–20)
BUN: 12 mg/dL (ref 6–20)
CHLORIDE: 102 mmol/L (ref 96–106)
CO2: 20 mmol/L (ref 18–29)
Calcium: 10.2 mg/dL (ref 8.7–10.2)
Creatinine, Ser: 0.77 mg/dL (ref 0.57–1.00)
GFR calc Af Amer: 113 mL/min/{1.73_m2} (ref >59)
GFR calc non Af Amer: 98 mL/min/{1.73_m2} (ref >59)
GLUCOSE: 110 mg/dL — AB (ref 65–99)
Globulin, Total: 2.9 g/dL (ref 1.5–4.5)
Potassium: 5.1 mmol/L (ref 3.5–5.2)
Sodium: 141 mmol/L (ref 134–144)
Total Protein: 7.2 g/dL (ref 6.0–8.5)

## 2015-10-08 LAB — CBC WITH DIFFERENTIAL/PLATELET
BASOS: 0 %
Basophils Absolute: 0.1 10*3/uL (ref 0.0–0.2)
EOS (ABSOLUTE): 0 10*3/uL (ref 0.0–0.4)
EOS: 0 %
HEMATOCRIT: 35.1 % (ref 34.0–46.6)
Hemoglobin: 10.5 g/dL — ABNORMAL LOW (ref 11.1–15.9)
Immature Grans (Abs): 0 10*3/uL (ref 0.0–0.1)
Immature Granulocytes: 0 %
LYMPHS ABS: 3.2 10*3/uL — AB (ref 0.7–3.1)
Lymphs: 29 %
MCH: 21 pg — AB (ref 26.6–33.0)
MCHC: 29.9 g/dL — AB (ref 31.5–35.7)
MCV: 70 fL — AB (ref 79–97)
MONOS ABS: 1 10*3/uL — AB (ref 0.1–0.9)
Monocytes: 9 %
Neutrophils Absolute: 6.9 10*3/uL (ref 1.4–7.0)
Neutrophils: 62 %
PLATELETS: 416 10*3/uL — AB (ref 150–379)
RBC: 5.01 x10E6/uL (ref 3.77–5.28)
RDW: 16.5 % — AB (ref 12.3–15.4)
WBC: 11.1 10*3/uL — AB (ref 3.4–10.8)

## 2015-10-08 LAB — IRON AND TIBC
IRON SATURATION: 4 % — AB (ref 15–55)
Iron: 18 ug/dL — ABNORMAL LOW (ref 27–159)
TIBC: 421 ug/dL (ref 250–450)
UIBC: 403 ug/dL (ref 131–425)

## 2015-10-08 LAB — FERRITIN: Ferritin: 11 ng/mL — ABNORMAL LOW (ref 15–150)

## 2015-10-08 LAB — T4, FREE: FREE T4: 1.01 ng/dL (ref 0.82–1.77)

## 2015-10-08 LAB — TSH: TSH: 4.38 u[IU]/mL (ref 0.450–4.500)

## 2015-10-08 LAB — T3, FREE: T3 FREE: 3 pg/mL (ref 2.0–4.4)

## 2016-02-15 ENCOUNTER — Telehealth: Payer: Self-pay | Admitting: Family Medicine

## 2016-02-15 NOTE — Telephone Encounter (Signed)
Notice received from pharmacy that pt "has evidence of COPD" because of a fill for Advair on 07/01/2014 I reviewed the chart and do not see anything to suggest that she has COPD; no chest xray report, nothing in the last two notes Advair is not on her current med listg

## 2016-11-10 ENCOUNTER — Encounter: Payer: Self-pay | Admitting: Family Medicine

## 2016-11-10 ENCOUNTER — Ambulatory Visit (INDEPENDENT_AMBULATORY_CARE_PROVIDER_SITE_OTHER): Payer: BC Managed Care – PPO | Admitting: Family Medicine

## 2016-11-10 VITALS — BP 124/78 | HR 96 | Temp 98.6°F | Resp 16 | Wt >= 6400 oz

## 2016-11-10 DIAGNOSIS — Z6841 Body Mass Index (BMI) 40.0 and over, adult: Secondary | ICD-10-CM

## 2016-11-10 DIAGNOSIS — J0100 Acute maxillary sinusitis, unspecified: Secondary | ICD-10-CM

## 2016-11-10 MED ORDER — AMOXICILLIN-POT CLAVULANATE 875-125 MG PO TABS: 1.0000 | ORAL_TABLET | Freq: Two times a day (BID) | ORAL | 0 refills | Status: AC

## 2016-11-10 NOTE — Progress Notes (Signed)
BP 124/78   Pulse 96   Temp 98.6 F (37 C) (Oral)   Resp 16   Wt (!) 414 lb 6.4 oz (188 kg)   LMP 11/03/2016   SpO2 96%   BMI 61.20 kg/m    Subjective:    Patient ID: Melinda Fox, female    DOB: 1975/09/02, 41 y.o.   MRN: [id number]  HPI: Melinda Fox is a 41 y.o. female  Chief Complaint  Patient presents with  . Sinusitis    jaw pain went to dentist yesterday told to f/u with Dr.   She is here for an acute visit Started with allergies and pollen; migraine with pressure changes She thinks she has a sinus infection; gets these four times a year; used to see Dr. Sherley BoundsSundaram Left side Excruciating pain, couldn't catch her breath wne to the dentist, nothing causing the pain from teeth Left ear okay Does not run fevers; 98 is high for her Pressure on the left side Allergic to spearmint and sulfa Tried OTCs 8 advil over course of yesterday, 3 tylenol; indomethacin for migraines  [facility] 11/10/2016 07/01/2015  Decreased Interest 0 3  Down, Depressed, Hopeless 1 3  PHQ - 2 Score 1 6  Altered sleeping - 3  Tired, decreased energy - 3  Change in appetite - 3  Feeling bad or failure about yourself  - 3  Trouble concentrating - 3  Moving slowly or fidgety/restless - 3  Suicidal thoughts - 0  PHQ-9 Score - 24  Difficult doing work/chores - Very difficult   Relevant past medical, surgical, family and social history reviewed Past Medical History:  Diagnosis Date  . Allergy   . Anemia   . Chronic migraine without aura without status migrainosus, not intractable   . Depression   . Elevated blood-pressure reading without diagnosis of hypertension   . Morbid obesity with body mass index (BMI) of 60.0 to 69.9 in adult [facility](HCC) 07/01/2015  . Scoliosis   . Sleep apnea, obstructive   . Thyroid disease    History reviewed. No pertinent surgical history. Family History  Problem Relation Age of Onset  . Adopted: Yes  . Family history unknown: Yes    Social History  Substance Use Topics  . Smoking status: Never Smoker  . Smokeless tobacco: Never Used  . Alcohol use 0.0 oz/week    Interim medical history since last visit reviewed. Allergies and medications reviewed  Review of Systems Per HPI unless specifically indicated above     Objective:    BP 124/78   Pulse 96   Temp 98.6 F (37 C) (Oral)   Resp 16   Wt (!) 414 lb 6.4 oz (188 kg)   LMP 11/03/2016   SpO2 96%   BMI 61.20 kg/m   Wt Readings from Last 3 Encounters:  11/10/16 (!) 414 lb 6.4 oz (188 kg)  10/07/15 (!) 406 lb 8 oz (184.4 kg)  08/01/15 (!) 405 lb (183.7 kg)    Physical Exam  Constitutional: She appears well-developed and well-nourished.  Morbidly obese  HENT:  Right Ear: Tympanic membrane and ear canal normal.  Left Ear: Tympanic membrane and ear canal normal.  Nose: Mucosal edema and rhinorrhea present. Left sinus exhibits maxillary sinus tenderness.  Mouth/Throat: Oropharynx is clear and moist and mucous membranes are normal.  Eyes: EOM are normal. No scleral icterus.  Cardiovascular: Normal rate and regular rhythm.   Pulmonary/Chest: Effort normal and breath sounds normal.  Psychiatric: She has a normal  mood and affect. Her behavior is normal.      Assessment & Plan:   Problem List Items Addressed This Visit      Other   Morbid obesity with body mass index (BMI) of 60.0 to 69.9 in adult [facility])    Patient voiced her displeasure with having to come to the doctor; explained she had previous bad experiences with doctors; I am guessing that this is in part due to her morbid obesity and that this may be brought up every single time she comes in for anything; given her sinus infection and feelings toward the medical establishment, I opted to not discuss her weight today, just take care of her immediate need; as trust grows, I am here to help her with this problem if she chooses to return and see me       Other Visit Diagnoses    Acute maxillary  sinusitis, recurrence not specified    -  Primary   hydration, supportive care, antibiotics; call if not improving       Follow up plan: No Follow-up on file.  An after-visit summary was printed and given to the patient at check-out.  Please see the patient instructions which may contain other information and recommendations beyond what is mentioned above in the assessment and plan.  Meds ordered this encounter  Medications  . amoxicillin-clavulanate (AUGMENTIN) 875-125 MG tablet    Sig: Take 1 tablet by mouth 2 (two) times daily.    Dispense:  20 tablet    Refill:  0    No orders of the defined types were placed in this encounter.

## 2016-11-10 NOTE — Patient Instructions (Signed)
Start the new antibiotics Please do eat yogurt daily or take a probiotic daily for the next month We want to replace the healthy germs in the gut If you notice foul, watery diarrhea in the next two months, schedule an appointment RIGHT AWAY

## 2016-11-28 ENCOUNTER — Encounter: Payer: Self-pay | Admitting: Family Medicine

## 2016-11-28 NOTE — Assessment & Plan Note (Signed)
Patient voiced her displeasure with having to come to the doctor; explained she had previous bad experiences with doctors; I am guessing that this is in part due to her morbid obesity and that this may be brought up every single time she comes in for anything; given her sinus infection and feelings toward the medical establishment, I opted to not discuss her weight today, just take care of her immediate need; as trust grows, I am here to help her with this problem if she chooses to return and see me

## 2017-01-27 ENCOUNTER — Ambulatory Visit (INDEPENDENT_AMBULATORY_CARE_PROVIDER_SITE_OTHER): Payer: BC Managed Care – PPO | Admitting: Family Medicine

## 2017-01-27 ENCOUNTER — Encounter: Payer: Self-pay | Admitting: Family Medicine

## 2017-01-27 VITALS — BP 122/76 | HR 84 | Temp 98.5°F | Resp 18 | Ht 69.0 in | Wt >= 6400 oz

## 2017-01-27 DIAGNOSIS — E039 Hypothyroidism, unspecified: Secondary | ICD-10-CM | POA: Diagnosis not present

## 2017-01-27 DIAGNOSIS — Z6841 Body Mass Index (BMI) 40.0 and over, adult: Secondary | ICD-10-CM

## 2017-01-27 DIAGNOSIS — R062 Wheezing: Secondary | ICD-10-CM

## 2017-01-27 DIAGNOSIS — J209 Acute bronchitis, unspecified: Secondary | ICD-10-CM

## 2017-01-27 DIAGNOSIS — E038 Other specified hypothyroidism: Secondary | ICD-10-CM

## 2017-01-27 MED ORDER — ALBUTEROL SULFATE HFA 108 (90 BASE) MCG/ACT IN AERS: 2.0000 | INHALATION_SPRAY | RESPIRATORY_TRACT | 0 refills | Status: DC | PRN

## 2017-01-27 MED ORDER — BENZONATATE 100 MG PO CAPS: 100.0000 mg | ORAL_CAPSULE | Freq: Three times a day (TID) | ORAL | 0 refills | Status: DC | PRN

## 2017-01-27 NOTE — Progress Notes (Signed)
BP 122/76 (BP Location: Right Arm, Patient Position: Sitting, Cuff Size: Normal)   Pulse 84   Temp 98.5 F (36.9 C) (Oral)   Resp 18   Ht 5\' 9"  (1.753 m)   Wt (!) 406 lb 14.4 oz (184.6 kg)   LMP 01/07/2017   SpO2 96%   BMI 60.09 kg/m    Subjective:    Patient ID: Melinda Fox, female    DOB: April 29, 1976, 41 y.o.   MRN: [id number]  HPI: Melinda Fox is a 41 y.o. female  Chief Complaint  Patient presents with  . URI    Fatigue, headache, cough, SOB and wheezing during the night.  POSS Chest congestion   HPI Got sick Tuesday night about 1 am Started with trouble breathing, coughing, wheezing, feeling miserable Most dry cough, brought up something orange in the shower Exposed to person with bronchitis Has used SABA in the past without relief Taking excedrin for headache Sore throat from the cough Ears are bothering her a little, not much; no sinus problems No rash; no personal travel Staying hydrated  I asked if I can bring her back for a visit to help her with her weight; she says yes; she has struggled with this for a long time; discussed thyroid issues in the past that have normalized; can't lose weight  Depression screen [employer] 2/9 01/27/2017 11/10/2016 07/01/2015  Decreased Interest 0 0 3  Down, Depressed, Hopeless 1 1 3   PHQ - 2 Score 1 1 6   Altered sleeping - - 3  Tired, decreased energy - - 3  Change in appetite - - 3  Feeling bad or failure about yourself  - - 3  Trouble concentrating - - 3  Moving slowly or fidgety/restless - - 3  Suicidal thoughts - - 0  PHQ-9 Score - - 24  Difficult doing work/chores - - Very difficult    Relevant past medical, surgical, family and social history reviewed Past Medical History:  Diagnosis Date  . Allergy   . Anemia   . Chronic migraine without aura without status migrainosus, not intractable   . Depression   . Elevated blood-pressure reading without diagnosis of hypertension   . Morbid obesity with body mass  index (BMI) of 60.0 to 69.9 in adult [facility]) 07/01/2015  . Scoliosis   . Sleep apnea, obstructive   . Thyroid disease    History reviewed. No pertinent surgical history. Family History  Problem Relation Age of Onset  . Adopted: Yes  . Family history unknown: Yes   Social History   Social History  . Marital status: Single    Spouse name: N/A  . Number of children: N/A  . Years of education: N/A   Occupational History  . Not on file.   Social History Main Topics  . Smoking status: Never Smoker  . Smokeless tobacco: Never Used  . Alcohol use 0.0 oz/week  . Drug use: No  . Sexual activity: Yes   Other Topics Concern  . Not on file   Social History Narrative  . No narrative on file   Interim medical history since last visit reviewed. Allergies and medications reviewed  Review of Systems Per HPI unless specifically indicated above     Objective:    BP 122/76 (BP Location: Right Arm, Patient Position: Sitting, Cuff Size: Normal)   Pulse 84   Temp 98.5 F (36.9 C) (Oral)   Resp 18   Ht 5\' 9"  (1.753 m)   Wt (!) 406 lb 14.4  oz (184.6 kg)   LMP 01/07/2017   SpO2 96%   BMI 60.09 kg/m   Wt Readings from Last 3 Encounters:  01/27/17 (!) 406 lb 14.4 oz (184.6 kg)  11/10/16 (!) 414 lb 6.4 oz (188 kg)  10/07/15 (!) 406 lb 8 oz (184.4 kg)    Physical Exam  Constitutional: She appears well-developed and well-nourished.  Morbidly obese  HENT:  Nose: No mucosal edema or rhinorrhea.  Mouth/Throat: Oropharynx is clear and moist and mucous membranes are normal. No oropharyngeal exudate, posterior oropharyngeal edema or posterior oropharyngeal erythema.  Cerumen in the right ear occluding the TM  Eyes: EOM are normal. Right eye exhibits no discharge. Left eye exhibits no discharge. No scleral icterus.  Cardiovascular: Normal rate and regular rhythm.   Pulmonary/Chest: Effort normal and breath sounds normal. She has no wheezes.  Occasional cough  Lymphadenopathy:    She  has no cervical adenopathy.  Skin:  Terminal hairs on the chin and jawline  Psychiatric: She has a normal mood and affect. Her behavior is normal.      Assessment & Plan:   Problem List Items Addressed This Visit      Endocrine   Subclinical hypothyroidism    Plan to check labs at f/u        Other   Morbid obesity with body mass index (BMI) of 60.0 to 69.9 in adult [facility](HCC)    Offered to have patient return and check labs and discussed weight loss in detail, consider medicine; see AVS       Other Visit Diagnoses    Acute bronchitis, unspecified organism    -  Primary   no antibiotics indicated; tessalon perles for cough; contagious, see AVS; reasons to go to urgent care reivewed   Wheezing       rx for SABA given, patient may work with pharmacist on proper use       Follow up plan: No Follow-up on file.  An after-visit summary was printed and given to the patient at check-out.  Please see the patient instructions which may contain other information and recommendations beyond what is mentioned above in the assessment and plan.  Meds ordered this encounter  Medications  . benzonatate (TESSALON PERLES) 100 MG capsule    Sig: Take 1 capsule (100 mg total) by mouth every 8 (eight) hours as needed for cough.    Dispense:  30 capsule    Refill:  0  . albuterol (PROVENTIL HFA;VENTOLIN HFA) 108 (90 Base) MCG/ACT inhaler    Sig: Inhale 2 puffs into the lungs every 4 (four) hours as needed for wheezing or shortness of breath.    Dispense:  1 Inhaler    Refill:  0    No orders of the defined types were placed in this encounter.

## 2017-01-27 NOTE — Assessment & Plan Note (Signed)
Offered to have patient return and check labs and discussed weight loss in detail, consider medicine; see AVS

## 2017-01-27 NOTE — Assessment & Plan Note (Signed)
Plan to check labs at f/u

## 2017-01-27 NOTE — Patient Instructions (Addendum)
I believe you have bronchitis You are contagious, so please take precautions to not spread this others Try vitamin C (orange juice if not diabetic or vitamin C tablets) and drink green tea to help your immune system during your illness Get plenty of rest and hydration Cough may persist for a few weeks, but do call or go to urgent care if trouble breathing, high fever I welcome you to return for a visit to help you with your weight Check out the information at familydoctor.org entitled "Nutrition for Weight Loss: What You Need to Know about Fad Diets" Try to lose between 1-2 pounds per week by taking in fewer calories and burning off more calories You can succeed by limiting portions, limiting foods dense in calories and fat, becoming more active, and drinking 8 glasses of water a day (64 ounces) Don't skip meals, especially breakfast, as skipping meals may alter your metabolism Do not use over-the-counter weight loss pills or gimmicks that claim rapid weight loss A healthy BMI (or body mass index) is between 18.5 and 24.9 You can calculate your ideal BMI at the NIH website [employer]huhttp://www.nhlbi.nih.gov/health/educational/lose_wt/BMI/bmicalc.htm    Acute Bronchitis, Adult Acute bronchitis is sudden (acute) swelling of the air tubes (bronchi) in the lungs. Acute bronchitis causes these tubes to fill with mucus, which can make it hard to breathe. It can also cause coughing or wheezing. In adults, acute bronchitis usually goes away within 2 weeks. A cough caused by bronchitis may last up to 3 weeks. Smoking, allergies, and asthma can make the condition worse. Repeated episodes of bronchitis may cause further lung problems, such as chronic obstructive pulmonary disease (COPD). What are the causes? This condition can be caused by germs and by substances that irritate the lungs, including:  Cold and flu viruses. This condition is most often caused by the same virus that causes a  cold.  Bacteria.  Exposure to tobacco smoke, dust, fumes, and air pollution.  What increases the risk? This condition is more likely to develop in people who:  Have close contact with someone with acute bronchitis.  Are exposed to lung irritants, such as tobacco smoke, dust, fumes, and vapors.  Have a weak immune system.  Have a respiratory condition such as asthma.  What are the signs or symptoms? Symptoms of this condition include:  A cough.  Coughing up clear, yellow, or green mucus.  Wheezing.  Chest congestion.  Shortness of breath.  A fever.  Body aches.  Chills.  A sore throat.  How is this diagnosed? This condition is usually diagnosed with a physical exam. During the exam, your health care provider may order tests, such as chest X-rays, to rule out other conditions. He or she may also:  Test a sample of your mucus for bacterial infection.  Check the level of oxygen in your blood. This is done to check for pneumonia.  Do a chest X-ray or lung function testing to rule out pneumonia and other conditions.  Perform blood tests.  Your health care provider will also ask about your symptoms and medical history. How is this treated? Most cases of acute bronchitis clear up over time without treatment. Your health care provider may recommend:  Drinking more fluids. Drinking more makes your mucus thinner, which may make it easier to breathe.  Taking a medicine for a fever or cough.  Taking an antibiotic medicine.  Using an inhaler to help improve shortness of breath and to control a cough.  Using a cool mist vaporizer  or humidifier to make it easier to breathe.  Follow these instructions at home: Medicines  Take over-the-counter and prescription medicines only as told by your health care provider.  If you were prescribed an antibiotic, take it as told by your health care provider. Do not stop taking the antibiotic even if you start to feel  better. General instructions  Get plenty of rest.  Drink enough fluids to keep your urine clear or pale yellow.  Avoid smoking and secondhand smoke. Exposure to cigarette smoke or irritating chemicals will make bronchitis worse. If you smoke and you need help quitting, ask your health care provider. Quitting smoking will help your lungs heal faster.  Use an inhaler, cool mist vaporizer, or humidifier as told by your health care provider.  Keep all follow-up visits as told by your health care provider. This is important. How is this prevented? To lower your risk of getting this condition again:  Wash your hands often with soap and water. If soap and water are not available, use hand sanitizer.  Avoid contact with people who have cold symptoms.  Try not to touch your hands to your mouth, nose, or eyes.  Make sure to get the flu shot every year.  Contact a health care provider if:  Your symptoms do not improve in 2 weeks of treatment. Get help right away if:  You cough up blood.  You have chest pain.  You have severe shortness of breath.  You become dehydrated.  You faint or keep feeling like you are going to faint.  You keep vomiting.  You have a severe headache.  Your fever or chills gets worse. This information is not intended to replace advice given to you by your health care provider. Make sure you discuss any questions you have with your health care provider. Document Released: 09/09/2004 Document Revised: 02/25/2016 Document Reviewed: 01/21/2016 Elsevier Interactive Patient Education  2017 [employer].

## 2017-01-28 ENCOUNTER — Other Ambulatory Visit: Payer: Self-pay | Admitting: Family Medicine

## 2017-01-28 MED ORDER — ALBUTEROL SULFATE HFA 108 (90 BASE) MCG/ACT IN AERS: 2.0000 | INHALATION_SPRAY | RESPIRATORY_TRACT | 0 refills | Status: DC | PRN

## 2017-01-28 NOTE — Progress Notes (Signed)
Proventil not covered by insurance; had to send new Rx for [employer]on ProductsProair

## 2017-02-04 ENCOUNTER — Telehealth: Payer: Self-pay | Admitting: Family Medicine

## 2017-02-04 MED ORDER — PREDNISONE 10 MG PO TABS: 30.0000 mg | ORAL_TABLET | Freq: Every day | ORAL | 0 refills | Status: AC

## 2017-02-04 NOTE — Telephone Encounter (Signed)
Pt states that she is not feeling any better and would like an antibiotic called into [facility] [address] Stilling coughing up pink, fatigue, wheezing, no fever.

## 2017-02-04 NOTE — Telephone Encounter (Signed)
Left a detail voicemail.  

## 2017-02-04 NOTE — Telephone Encounter (Signed)
I don't think she needs an antibiotic for bronchitis I'll be glad to call in some prednisone for the wheezing To urgent care or ER if s/s of pneumonia Bronchitis should run its course without antibiotics

## 2017-02-04 NOTE — Telephone Encounter (Signed)
No

## 2017-02-04 NOTE — Telephone Encounter (Signed)
Pt states she used amoxicillin in the past and it did help with her bronchitis. She is wondering can she have that prescribed.

## 2017-02-24 ENCOUNTER — Ambulatory Visit (INDEPENDENT_AMBULATORY_CARE_PROVIDER_SITE_OTHER): Payer: BC Managed Care – PPO | Admitting: Family Medicine

## 2017-02-24 ENCOUNTER — Encounter: Payer: Self-pay | Admitting: Family Medicine

## 2017-02-24 VITALS — BP 128/76 | HR 92 | Temp 98.3°F | Resp 16 | Ht 69.0 in | Wt >= 6400 oz

## 2017-02-24 DIAGNOSIS — H6121 Impacted cerumen, right ear: Secondary | ICD-10-CM | POA: Diagnosis not present

## 2017-02-24 DIAGNOSIS — M519 Unspecified thoracic, thoracolumbar and lumbosacral intervertebral disc disorder: Secondary | ICD-10-CM | POA: Diagnosis not present

## 2017-02-24 DIAGNOSIS — J0110 Acute frontal sinusitis, unspecified: Secondary | ICD-10-CM

## 2017-02-24 DIAGNOSIS — Z789 Other specified health status: Secondary | ICD-10-CM | POA: Diagnosis not present

## 2017-02-24 DIAGNOSIS — Z6841 Body Mass Index (BMI) 40.0 and over, adult: Secondary | ICD-10-CM | POA: Diagnosis not present

## 2017-02-24 DIAGNOSIS — M5431 Sciatica, right side: Secondary | ICD-10-CM | POA: Diagnosis not present

## 2017-02-24 DIAGNOSIS — H66001 Acute suppurative otitis media without spontaneous rupture of ear drum, right ear: Secondary | ICD-10-CM | POA: Diagnosis not present

## 2017-02-24 MED ORDER — AMOXICILLIN-POT CLAVULANATE 875-125 MG PO TABS: 1.0000 | ORAL_TABLET | Freq: Two times a day (BID) | ORAL | 0 refills | Status: AC

## 2017-02-24 NOTE — Patient Instructions (Addendum)
Otitis Media, Adult Otitis media is redness, soreness, and puffiness (swelling) in the space just behind your eardrum (middle ear). It may be caused by allergies or infection. It often happens along with a cold. Follow these instructions at home:  Take your medicine as told. Finish it even if you start to feel better.  Only take over-the-counter or prescription medicines for pain, discomfort, or fever as told by your doctor.  Follow up with your doctor as told. Contact a doctor if:  You have otitis media only in one ear, or bleeding from your nose, or both.  You notice a lump on your neck.  You are not getting better in 3-5 days.  You feel worse instead of better. Get help right away if:  You have pain that is not helped with medicine.  You have puffiness, redness, or pain around your ear.  You get a stiff neck.  You cannot move part of your face (paralysis).  You notice that the bone behind your ear hurts when you touch it. This information is not intended to replace advice given to you by your health care provider. Make sure you discuss any questions you have with your health care provider. Document Released: 01/19/2008 Document Revised: 01/08/2016 Document Reviewed: 02/27/2013 Elsevier Interactive Patient Education  2017 Else[employer] Inc. Sinusitis, Adult Sinusitis is soreness and inflammation of your sinuses. Sinuses are hollow spaces in the bones around your face. They are located:  Around your eyes.  In the middle of your forehead.  Behind your nose.  In your cheekbones.  Your sinuses and nasal passages are lined with a stringy fluid (mucus). Mucus normally drains out of your sinuses. When your nasal tissues get inflamed or swollen, the mucus can get trapped or blocked so air cannot flow through your sinuses. This lets bacteria, viruses, and funguses grow, and that leads to infection. Follow these instructions at home: Medicines  Take, use, or apply over-the-counter  and prescription medicines only as told by your doctor. These may include nasal sprays.  If you were prescribed an antibiotic medicine, take it as told by your doctor. Do not stop taking the antibiotic even if you start to feel better. Hydrate and Humidify  Drink enough water to keep your pee (urine) clear or pale yellow.  Use a cool mist humidifier to keep the humidity level in your home above 50%.  Breathe in steam for 10-15 minutes, 3-4 times a day or as told by your doctor. You can do this in the bathroom while a hot shower is running.  Try not to spend time in cool or dry air. Rest  Rest as much as possible.  Sleep with your head raised (elevated).  Make sure to get enough sleep each night. General instructions  Put a warm, moist washcloth on your face 3-4 times a day or as told by your doctor. This will help with discomfort.  Wash your hands often with soap and water. If there is no soap and water, use hand sanitizer.  Do not smoke. Avoid being around people who are smoking (secondhand smoke).  Keep all follow-up visits as told by your doctor. This is important. Contact a doctor if:  You have a fever.  Your symptoms get worse.  Your symptoms do not get better within 10 days. Get help right away if:  You have a very bad headache.  You cannot stop throwing up (vomiting).  You have pain or swelling around your face or eyes.  You have trouble   seeing.  You feel confused.  Your neck is stiff.  You have trouble breathing. This information is not intended to replace advice given to you by your health care provider. Make sure you discuss any questions you have with your health care provider. Document Released: 01/19/2008 Document Revised: 03/28/2016 Document Reviewed: 05/28/2015 Elsevier Interactive Patient Education  2018 Elsevier Inc.  

## 2017-02-24 NOTE — Progress Notes (Addendum)
Name: Melinda Fox   MRN: [id number]    DOB: 12/19/1975   Date:02/24/2017       Progress Note  Subjective  Chief Complaint  Chief Complaint  Patient presents with  . Ear Pain    right ear, headache, sneezing    HPI  -Pt presents with 6 day history of c/o RIGHT ear pain and mild pressure with sensation of drainage, headache, and sinus congestion and pain, itchy/watery eyes, sneezing.  No sore throat, cough, chest pain, shortness of breath, abdominal pain, nausea or vomiting, diarrhea, body aches, fevers or chills. -When asked about anithistamines and nasal decongestants pt states, "I have tried them all". Listed Allegra, Zyrtex, Xyzal, Claritin, Benadril, Flonase, and Dymista and patient states does not want to try. She uses Advil cold and sinus sometimes and this can help to dry her out, but has not used it for this illness.  -Pt also asks for letter to allow her to wear sneakers at her job. She is morbidly obese and has history of right-sided sciatica neuralgia and of lumbosacral disc disease. Discussed this with PCP Dr. Sherie DonLada and states this is OK to provide. This will be mailed to her home once the letter is completed.  Patient Active Problem List   Diagnosis Date Noted  . Other fatigue 10/07/2015  . Iron deficiency anemia 07/01/2015  . Migraine without aura and with status migrainosus, not intractable 07/01/2015  . Major depression, recurrent, full remission (HCC) 07/01/2015  . Obstructive sleep apnea 07/01/2015  . Subclinical hypothyroidism 07/01/2015  . History of pneumonia 07/01/2015  . Allergic rhinitis 07/01/2015  . Lumbosacral disc disease 07/01/2015  . Sciatica neuralgia 07/01/2015  . Morbid obesity with body mass index (BMI) of 60.0 to 69.9 in adult [facility](HCC) 07/01/2015    Social History  Substance Use Topics  . Smoking status: Never Smoker  . Smokeless tobacco: Never Used  . Alcohol use 0.0 oz/week    Current Outpatient Prescriptions:  .  albuterol (PROAIR HFA)  108 (90 Base) MCG/ACT inhaler, Inhale 2 puffs into the lungs every 4 (four) hours as needed for wheezing or shortness of breath., Disp: 1 Inhaler, Rfl: 0 .  indomethacin (INDOCIN) 50 MG capsule, Take 1 capsule (50 mg total) by mouth 3 (three) times daily as needed., Disp: 40 capsule, Rfl: 1  Allergies  Allergen Reactions  . Sulfa Antibiotics Other (See Comments)    Was told that she had it as a child    ROS Ten systems reviewed and is negative except as mentioned in HPI  Objective  Vitals:   02/24/17 1429  BP: 128/76  Pulse: 92  Resp: 16  Temp: 98.3 F (36.8 C)  TempSrc: Oral  SpO2: 96%  Weight: (!) 403 lb 14.4 oz (183.2 kg)  Height: 5\' 9"  (1.753 m)   Body mass index is 59.65 kg/m.  Nursing Note and Vital Signs reviewed.  Physical Exam  Constitutional: Patient appears well-developed and well-nourished. Obese No distress.  HEENT: head atraumatic, normocephalic, pupils equal and reactive to light, EOM's intact, TM's blocked by cerumen impaction initially, after lavage LEFT TM is without erythema or bulging and RIGHT TM is notably inflamed and retracted; Mild bilateral maxillary sinus tenderness; Moderate bilateral frontal sinus tenderness, neck supple without lymphadenopathy, oropharynx mildly erythematous and moist without exudate. Cardiovascular: Normal rate, regular rhythm, S1/S2 present.  No murmur or rub heard. No BLE edema. Pulmonary/Chest: Effort normal and breath sounds clear. No respiratory distress or retractions. Psychiatric: Patient has a normal mood and affect. behavior  is normal. Judgment and thought content normal.  No results found for this or any previous visit (from the past 2160 hour(s)).  Assessment & Plan  1. Acute non-recurrent frontal sinusitis - amoxicillin-clavulanate (AUGMENTIN) 875-125 MG tablet; Take 1 tablet by mouth 2 (two) times daily.  Dispense: 20 tablet; Refill: 0  2. Impacted cerumen of right ear - Ear Lavage  3. Acute suppurative  otitis media of right ear without spontaneous rupture of tympanic membrane, recurrence not specified - amoxicillin-clavulanate (AUGMENTIN) 875-125 MG tablet; Take 1 tablet by mouth 2 (two) times daily.  Dispense: 20 tablet; Refill: 0  4. Morbid obesity with body mass index (BMI) of 60.0 to 69.9 in adult (HCC) 5. Sciatica of right side 6. Lumbosacral disc disease 7. Prolonged standing Letter to be prepared stating OK for patient to wear sneakers while working due to the above medical problems and prolonged standing at her job.  We are to mail this letter to her home once it is ready because she will be out of town starting tomorrow until August.  - Pt declines to schedule a chronic follow up with PCP because she is going out of town for an extended period of time. She will call upon her return. - She is agreeable to seek medical care in [place] where she will be traveling tomorrow until August if she has any red flags as below, she will call if she has any concerns or questions. -Red flags and when to present for emergency care or RTC including fever >101.40F, chest pain, shortness of breath, new/worsening/un-resolving symptoms, increased ear pain, eye pain/swelling, severe headache reviewed with patient at time of visit. Follow up and care instructions discussed and provided in AVS.  I have reviewed this encounter including the documentation in this note and/or discussed this patient with the Deboraha Sprang, FNP, NP-C. I am certifying that I agree with the content of this note as supervising physician.  Alba Cory, MD [facility] 02/25/2017, 10:57 AM

## 2017-05-17 ENCOUNTER — Ambulatory Visit (INDEPENDENT_AMBULATORY_CARE_PROVIDER_SITE_OTHER): Payer: BC Managed Care – PPO | Admitting: Family Medicine

## 2017-05-17 ENCOUNTER — Encounter: Payer: Self-pay | Admitting: Family Medicine

## 2017-05-17 ENCOUNTER — Other Ambulatory Visit: Payer: Self-pay

## 2017-05-17 ENCOUNTER — Telehealth: Payer: Self-pay | Admitting: Family Medicine

## 2017-05-17 ENCOUNTER — Telehealth: Payer: Self-pay

## 2017-05-17 DIAGNOSIS — Z6841 Body Mass Index (BMI) 40.0 and over, adult: Secondary | ICD-10-CM

## 2017-05-17 DIAGNOSIS — R0602 Shortness of breath: Secondary | ICD-10-CM

## 2017-05-17 DIAGNOSIS — D509 Iron deficiency anemia, unspecified: Secondary | ICD-10-CM | POA: Diagnosis not present

## 2017-05-17 LAB — T3, FREE: T3 FREE: 3 pg/mL (ref 2.3–4.2)

## 2017-05-17 LAB — T4: T4 TOTAL: 8.4 ug/dL (ref 5.1–11.9)

## 2017-05-17 NOTE — Telephone Encounter (Signed)
Pt called stating Dr Sherie Don is sending her for 2 different test and pt is requesting these to be scheduled on the same day if possible.

## 2017-05-17 NOTE — Progress Notes (Signed)
BP 124/76 (BP Location: Left Wrist)   Pulse 78   Temp 98.7 F (37.1 C) (Oral)   Resp 16   Wt (!) 415 lb 12.8 oz (188.6 kg)   LMP 04/20/2017   SpO2 96%   BMI 61.40 kg/m    Subjective:    Patient ID: Melinda Fox, female    DOB: 01/15/1976, 41 y.o.   MRN: [id number]  HPI: Melinda Fox is a 41 y.o. female  Chief Complaint  Patient presents with  . Doctor Note    Pt is having trouble walking up and down the stairs at work and would need a note in order to beable to use the elevator at work since its locked    HPI Patient works at [employer]; her class is on the 3rd floor; she is unable to go up two flights of stairs to get to her classroom; making 3 full trips each day up and down She fell down the stairs one day trying to walk down a straight set of stairs; lost balance; about a month ago; did not hit her head; fell down four stairs; could not do anything about it; Sept 4th; bruised her dignity but no physical injuries Balance issues that she thinks are related to her weight; if she takes the spiral stairs has to hold on to left bannister, right knee compensates and that starts acting up too; stair tread is very narrow there, feels uncomfortable 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; just manages it; was in a boot for 9 months; did PT; she has not had her foot xrayed lately; diagnosed in [employer]; wears sneakers and takes medicine when needed, Advil and indomethacin; sometimes takes both; immediately cautioned about risk She has chronic bronchitis, gets short of breath just climbing stairs; takes 10 minutes to recover one flight of stairs; she had an echocardiogram She has done the diets and did the exercise programs She has morbid obesity; just frustrated over years of  107 pounds in the 2nd grade, biggest kid in 2nd grade and weighed in front of classmates; active and doesn't just sit around She has looked into bariatric surgery; would be willing to learn  more about it  Depression screen [facility] 2/9 05/17/2017 01/27/2017 11/10/2016 07/01/2015  Decreased Interest 0 0 0 3  Down, Depressed, Hopeless PHQ - 2 Score Altered sleeping - - - 3  Tired, decreased energy - - - 3  Change in appetite - - - 3  Feeling bad or failure about yourself  - - - 3  Trouble concentrating - - - 3  Moving slowly or fidgety/restless - - - 3  Suicidal thoughts - - - 0  PHQ-9 Score - - - 24  Difficult doing work/chores - - - Very difficult    Relevant past medical, surgical, family and social history reviewed Past Medical History:  Diagnosis Date  . Allergy   . Anemia   . Chronic migraine without aura without status migrainosus, not intractable   . Depression   . Elevated blood-pressure reading without diagnosis of hypertension   . Morbid obesity with body mass index (BMI) of 60.0 to 69.9 in adult [facility]) 07/01/2015  . Scoliosis   . Sleep apnea, obstructive   . Thyroid disease    History reviewed. No pertinent surgical history. Family History  Problem Relation Age of Onset  . Adopted: Yes  . Family history unknown: Yes   Social  History   Social History  . Marital status: Single    Spouse name: N/A  . Number of children: N/A  . Years of education: N/A   Occupational History  . Not on file.   Social History Main Topics  . Smoking status: Never Smoker  . Smokeless tobacco: Never Used  . Alcohol use 0.0 oz/week     Comment: once a year  . Drug use: No  . Sexual activity: No   Other Topics Concern  . Not on file   Social History Narrative  . No narrative on file    Interim medical history since last visit reviewed. Allergies and medications reviewed  Review of Systems Per HPI unless specifically indicated above     Objective:    BP 124/76 (BP Location: Left Wrist)   Pulse 78   Temp 98.7 F (37.1 C) (Oral)   Resp 16   Wt (!) 415 lb 12.8 oz (188.6 kg)   LMP 04/20/2017   SpO2 96%   BMI 61.40 kg/m   Wt Readings from  Last 3 Encounters:  05/17/17 (!) 415 lb 12.8 oz (188.6 kg)  02/24/17 (!) 403 lb 14.4 oz (183.2 kg)  01/27/17 (!) 406 lb 14.4 oz (184.6 kg)    Physical Exam  Constitutional: She appears well-developed and well-nourished. No distress.  HENT:  Head: Normocephalic.  Eyes: No scleral icterus.  Neck: No thyromegaly present.  Cardiovascular: Normal rate, regular rhythm and normal heart sounds.   Pulmonary/Chest: Effort normal and breath sounds normal. No respiratory distress.  Abdominal: Soft. Bowel sounds are normal. She exhibits no distension.  Musculoskeletal: She exhibits no edema.  Neurological: She is alert. She exhibits normal muscle tone.  Skin: Skin is warm and dry. She is not diaphoretic. No pallor.  Psychiatric: She has a normal mood and affect. Her behavior is normal.    Results for orders placed or performed in visit on 10/07/15  CBC with Differential/Platelet  Result Value Ref Range   WBC 11.1 (H) 3.4 - 10.8 x10E3/uL   RBC 5.01 3.77 - 5.28 x10E6/uL   Hemoglobin 10.5 (L) 11.1 - 15.9 g/dL   Hematocrit 16.1 09.6 - 46.6 %   MCV 70 (L) 79 - 97 fL   MCH 21.0 (L) 26.6 - 33.0 pg   MCHC 29.9 (L) 31.5 - 35.7 g/dL   RDW 04.5 (H) 40.9 - 81.1 %   Platelets 416 (H) 150 - 379 x10E3/uL   Neutrophils 62 %   Lymphs 29 %   Monocytes 9 %   Eos 0 %   Basos 0 %   Neutrophils Absolute 6.9 1.4 - 7.0 x10E3/uL   Lymphocytes Absolute 3.2 (H) 0.7 - 3.1 x10E3/uL   Monocytes Absolute 1.0 (H) 0.1 - 0.9 x10E3/uL   EOS (ABSOLUTE) 0.0 0.0 - 0.4 x10E3/uL   Basophils Absolute 0.1 0.0 - 0.2 x10E3/uL   Immature Granulocytes 0 %   Immature Grans (Abs) 0.0 0.0 - 0.1 x10E3/uL  Comprehensive metabolic panel  Result Value Ref Range   Glucose 110 (H) 65 - 99 mg/dL   BUN 12 6 - 20 mg/dL   Creatinine, Ser 9.14 0.57 - 1.00 mg/dL   GFR calc non Af Amer 98 >59 mL/min/1.73   GFR calc Af Amer 113 >59 mL/min/1.73   BUN/Creatinine Ratio 16 8 - 20   Sodium 141 134 - 144 mmol/L   Potassium 5.1 3.5 - 5.2 mmol/L     Chloride 102 96 - 106 mmol/L   CO2 20 18 -  29 mmol/L   Calcium 10.2 8.7 - 10.2 mg/dL   Total Protein 7.2 6.0 - 8.5 g/dL   Albumin 4.3 3.5 - 5.5 g/dL   Globulin, Total 2.9 1.5 - 4.5 g/dL   Albumin/Globulin Ratio 1.5 1.1 - 2.5   Bilirubin Total <0.2 0.0 - 1.2 mg/dL   Alkaline Phosphatase 95 39 - 117 IU/L   AST 16 0 - 40 IU/L   ALT 25 0 - 32 IU/L  Iron and TIBC  Result Value Ref Range   Total Iron Binding Capacity 421 250 - 450 ug/dL   UIBC 119 147 - 829 ug/dL   Iron 18 (L) 27 - 562 ug/dL   Iron Saturation 4 (LL) 15 - 55 %  Ferritin  Result Value Ref Range   Ferritin 11 (L) 15 - 150 ng/mL  T4, free  Result Value Ref Range   Free T4 1.01 0.82 - 1.77 ng/dL  TSH  Result Value Ref Range   TSH 4.380 0.450 - 4.500 uIU/mL  T3, free  Result Value Ref Range   T3, Free 3.0 2.0 - 4.4 pg/mL      Assessment & Plan:   Problem List Items Addressed This Visit      Other   Shortness of breath on exertion    Discussed risk of pulmonary hypertension, restrictive lung disease, cardiomegaly, CHF, etc; will get spirometry and echocardiogram; f/u after tests      Relevant Orders   Spirometry: Pre & Post Eval   ECHOCARDIOGRAM COMPLETE   CBC with Differential/Platelet   COMPLETE METABOLIC PANEL WITH GFR   Morbid obesity with body mass index (BMI) of 60.0 to 69.9 in adult [facility])    Willing to try medication; discussed risk of morbid obesity on her health      Relevant Orders   Amb Referral to Bariatric Surgery   ECHOCARDIOGRAM COMPLETE   TSH   Lipid panel   Iron deficiency anemia    Check cbc      Relevant Medications   vitamin B-12 (CYANOCOBALAMIN) 1000 MCG tablet   Other Relevant Orders   CBC with Differential/Platelet       Follow up plan: No Follow-up on file.  An after-visit summary was printed and given to the patient at check-out.  Please see the patient instructions which may contain other information and recommendations beyond what is mentioned above in the  assessment and plan.  Meds ordered this encounter  Medications  . vitamin B-12 (CYANOCOBALAMIN) 1000 MCG tablet    Sig: Take 1,000 mcg by mouth once a week.    Orders Placed This Encounter  Procedures  . CBC with Differential/Platelet  . TSH  . COMPLETE METABOLIC PANEL WITH GFR  . Lipid panel  . Amb Referral to Bariatric Surgery  . ECHOCARDIOGRAM COMPLETE  . Spirometry: Pre & Post Eval  Face-to-face time with patient was more than 25 minutes, >50% time spent counseling and coordination of care

## 2017-05-17 NOTE — Telephone Encounter (Signed)
Labs has been order and given to angie to add on.

## 2017-05-17 NOTE — Assessment & Plan Note (Signed)
Willing to try medication; discussed risk of morbid obesity on her health

## 2017-05-17 NOTE — Telephone Encounter (Signed)
I'm not sure what she means, but you can add on a free T4 and a free T3 to the blood, but I don't know if insurance will pay for it; we can try Please order Dx: morbid obesity

## 2017-05-17 NOTE — Assessment & Plan Note (Signed)
Check cbc 

## 2017-05-17 NOTE — Assessment & Plan Note (Signed)
Discussed risk of pulmonary hypertension, restrictive lung disease, cardiomegaly, CHF, etc; will get spirometry and echocardiogram; f/u after tests

## 2017-05-17 NOTE — Telephone Encounter (Signed)
Pt asking to order a extended thyroid  panel since it don't show up on the regular panel.

## 2017-05-17 NOTE — Patient Instructions (Signed)
Note okay for elevator use -- unrestricted access to the elevator at [employer] for medical reasons Let's get an echocardiogram and breathing tests We'll refer you for a bariatric surgery appointment

## 2017-05-18 ENCOUNTER — Other Ambulatory Visit: Payer: Self-pay

## 2017-05-18 ENCOUNTER — Other Ambulatory Visit: Payer: Self-pay | Admitting: Family Medicine

## 2017-05-18 DIAGNOSIS — D509 Iron deficiency anemia, unspecified: Secondary | ICD-10-CM

## 2017-05-18 DIAGNOSIS — R0602 Shortness of breath: Secondary | ICD-10-CM

## 2017-05-18 DIAGNOSIS — Z6841 Body Mass Index (BMI) 40.0 and over, adult: Secondary | ICD-10-CM

## 2017-05-18 NOTE — Assessment & Plan Note (Signed)
Add on iron panel, refer to heme

## 2017-05-18 NOTE — Telephone Encounter (Signed)
Barbara with [facility] will contact this patient with an appt date and time for both of the requested procedures.

## 2017-05-18 NOTE — Addendum Note (Signed)
Addended by: Idelle Crouch A on: 05/18/2017 03:39 PM   Modules accepted: Orders

## 2017-05-18 NOTE — Progress Notes (Signed)
Asher Muir, please ADD ON the iron, TIBC, and ferritin to blood in lab I've entered the orders Thank you

## 2017-05-19 LAB — LIPID PANEL
CHOLESTEROL: 165 mg/dL (ref <200)
HDL: 48 mg/dL — AB (ref >50)
LDL Cholesterol (Calc): 97 mg/dL (calc)
Non-HDL Cholesterol (Calc): 117 mg/dL (calc) (ref <130)
Total CHOL/HDL Ratio: 3.4 (calc) (ref <5.0)
Triglycerides: 102 mg/dL (ref <150)

## 2017-05-19 LAB — COMPLETE METABOLIC PANEL WITH GFR
AG RATIO: 1.4 (calc) (ref 1.0–2.5)
ALBUMIN MSPROF: 4.1 g/dL (ref 3.6–5.1)
ALKALINE PHOSPHATASE (APISO): 83 U/L (ref 33–115)
ALT: 17 U/L (ref 6–29)
AST: 15 U/L (ref 10–30)
BILIRUBIN TOTAL: 0.3 mg/dL (ref 0.2–1.2)
BUN: 9 mg/dL (ref 7–25)
CO2: 26 mmol/L (ref 20–32)
Calcium: 9.8 mg/dL (ref 8.6–10.2)
Chloride: 107 mmol/L (ref 98–110)
Creat: 0.78 mg/dL (ref 0.50–1.10)
GFR, Est African American: 111 mL/min/{1.73_m2} (ref >=60)
GFR, Est Non African American: 96 mL/min/{1.73_m2} (ref >=60)
GLOBULIN: 2.9 g/dL (ref 1.9–3.7)
Glucose, Bld: 84 mg/dL (ref 65–139)
POTASSIUM: 4.3 mmol/L (ref 3.5–5.3)
SODIUM: 141 mmol/L (ref 135–146)
Total Protein: 7 g/dL (ref 6.1–8.1)

## 2017-05-19 LAB — CBC WITH DIFFERENTIAL/PLATELET
BASOS ABS: 47 {cells}/uL (ref 0–200)
Basophils Relative: 0.5 %
Eosinophils Absolute: 0 cells/uL — ABNORMAL LOW (ref 15–500)
Eosinophils Relative: 0 %
HCT: 33.4 % — ABNORMAL LOW (ref 35.0–45.0)
Hemoglobin: 9.9 g/dL — ABNORMAL LOW (ref 11.7–15.5)
Lymphs Abs: 2623 cells/uL (ref 850–3900)
MCH: 20.8 pg — AB (ref 27.0–33.0)
MCHC: 29.6 g/dL — AB (ref 32.0–36.0)
MCV: 70.3 fL — AB (ref 80.0–100.0)
MPV: 10.6 fL (ref 7.5–12.5)
Monocytes Relative: 7.2 %
NEUTROS PCT: 64.4 %
Neutro Abs: 6054 cells/uL (ref 1500–7800)
Platelets: 374 10*3/uL (ref 140–400)
RBC: 4.75 10*6/uL (ref 3.80–5.10)
RDW: 15.7 % — AB (ref 11.0–15.0)
TOTAL LYMPHOCYTE: 27.9 %
WBC: 9.4 10*3/uL (ref 3.8–10.8)
WBCMIX: 677 {cells}/uL (ref 200–950)

## 2017-05-19 LAB — TEST AUTHORIZATION

## 2017-05-19 LAB — IRON,TIBC AND FERRITIN PANEL
%SAT: 4 % — AB (ref 11–50)
FERRITIN: 9 ng/mL — AB (ref 10–154)
IRON: 17 ug/dL — AB (ref 40–190)
TIBC: 446 mcg/dL (calc) (ref 250–450)

## 2017-05-19 LAB — TSH: TSH: 3.67 mIU/L

## 2017-05-26 ENCOUNTER — Ambulatory Visit: Payer: BC Managed Care – PPO

## 2017-05-27 ENCOUNTER — Ambulatory Visit: Payer: BC Managed Care – PPO | Admitting: Oncology

## 2017-05-31 ENCOUNTER — Ambulatory Visit
Admission: RE | Admit: 2017-05-31 | Discharge: 2017-05-31 | Disposition: A | Discharge status: Home / Self Care | Payer: BC Managed Care – PPO | Source: Ambulatory Visit | Attending: Family Medicine | Admitting: Family Medicine

## 2017-05-31 ENCOUNTER — Encounter: Payer: Self-pay | Admitting: Oncology

## 2017-05-31 ENCOUNTER — Ambulatory Visit (HOSPITAL_COMMUNITY): Payer: BC Managed Care – PPO

## 2017-05-31 ENCOUNTER — Inpatient Hospital Stay: Payer: BC Managed Care – PPO | Attending: Oncology | Admitting: Oncology

## 2017-05-31 VITALS — BP 144/80 | HR 88 | Temp 98.2°F | Wt >= 6400 oz

## 2017-05-31 DIAGNOSIS — Z6841 Body Mass Index (BMI) 40.0 and over, adult: Secondary | ICD-10-CM | POA: Insufficient documentation

## 2017-05-31 DIAGNOSIS — R0609 Other forms of dyspnea: Secondary | ICD-10-CM | POA: Diagnosis not present

## 2017-05-31 DIAGNOSIS — D649 Anemia, unspecified: Secondary | ICD-10-CM | POA: Diagnosis not present

## 2017-05-31 DIAGNOSIS — R0602 Shortness of breath: Secondary | ICD-10-CM

## 2017-05-31 DIAGNOSIS — F329 Major depressive disorder, single episode, unspecified: Secondary | ICD-10-CM | POA: Diagnosis not present

## 2017-05-31 DIAGNOSIS — K921 Melena: Secondary | ICD-10-CM | POA: Insufficient documentation

## 2017-05-31 DIAGNOSIS — Z79899 Other long term (current) drug therapy: Secondary | ICD-10-CM | POA: Diagnosis not present

## 2017-05-31 DIAGNOSIS — D509 Iron deficiency anemia, unspecified: Secondary | ICD-10-CM | POA: Diagnosis present

## 2017-05-31 DIAGNOSIS — I517 Cardiomegaly: Secondary | ICD-10-CM | POA: Insufficient documentation

## 2017-05-31 DIAGNOSIS — E079 Disorder of thyroid, unspecified: Secondary | ICD-10-CM | POA: Diagnosis not present

## 2017-05-31 DIAGNOSIS — G4733 Obstructive sleep apnea (adult) (pediatric): Secondary | ICD-10-CM | POA: Diagnosis not present

## 2017-05-31 DIAGNOSIS — M419 Scoliosis, unspecified: Secondary | ICD-10-CM | POA: Insufficient documentation

## 2017-05-31 LAB — ECHOCARDIOGRAM COMPLETE: Weight: 6628 oz

## 2017-05-31 NOTE — Progress Notes (Signed)
Hematology/Oncology Consult note [facility] [phone] [phone]   Patient Care Team: Lada, Janit Bern, MD as PCP - General (Family Medicine)  REFERRING PROVIDER: Kerman Passey, MD CHIEF COMPLAINTS/PURPOSE OF CONSULTATION:  Anemia evaluation  HISTORY OF PRESENTING ILLNESS:  Melinda Fox is a @ 41 y.o.  female with PMH listed below who was referred by xxx to me for evaluation of anemia. Patient reports that she has long-standing anemia history since she was teenage. She says that she'll occasionally see blood in the stool. Her menstrual period is regular heavy on the first 2 days and then normal flow. She is morbidly obese. She denies taking any medication currently and also reports that she is noncompliant with any oral medications. She used to take vitamin B-12 oral supplementations for antigen post. She is adopted so she does not know her family history. 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 Her kids a vegetarian but she cooks chicken or fish a few times a week for herself.eports feeling fatigued lack of energy, shortness of breath with exertion,denies any abdominal pain, history of clots.   ROS:  Review of Systems  Constitutional: Negative for fever, night sweats,unintentional weight loss, change in appetite.positive forfatigue HENT: Negative for ear pain, hearing loss, nasal bleeding Eyes: Negative for eye pain, double vision   Respiratory: Negative for wheezing, cough positive for shortness of breath Cardiovascular: Negative for chest pain, palpitation.   Gastrointestinal: Negative abdominal pain, diarrhea, nausea vomiting Endocrine: positive for history of thyroid disease. She does not take thyroid medication. Genitourinary: Negative for dysuria, hematuria, frequency Skin: Negative for rash, iching, bruising Neurological: Negative for headache, dizziness, seizure Hematological: Negative for easy bruising/bleeding, lymph  node enlargement Psychiatric/Behavioral: Negative for depression, anxiety, suicidality  MEDICAL HISTORY:  Past Medical History:  Diagnosis Date  . Allergy   . Anemia   . Chronic migraine without aura without status migrainosus, not intractable   . Depression   . Elevated blood-pressure reading without diagnosis of hypertension   . Morbid obesity with body mass index (BMI) of 60.0 to 69.9 in adult [facility]) 07/01/2015  . Scoliosis   . Sleep apnea, obstructive   . Thyroid disease     SURGICAL HISTORY: History reviewed. No pertinent surgical history.  SOCIAL HISTORY: Social History   Social History  . Marital status: Single    Spouse name: N/A  . Number of children: N/A  . Years of education: N/A   Occupational History  . Not on file.   Social History Main Topics  . Smoking status: Never Smoker  . Smokeless tobacco: Never Used  . Alcohol use No     Comment: once a year  . Drug use: No  . Sexual activity: No   Other Topics Concern  . Not on file   Social History Narrative  . No narrative on file    FAMILY HISTORY: Family History  Problem Relation Age of Onset  . Adopted: Yes  . Family history unknown: Yes    ALLERGIES:  is allergic to sulfa antibiotics.  MEDICATIONS:  Current Outpatient Prescriptions  Medication Sig Dispense Refill  . indomethacin (INDOCIN) 50 MG capsule Take 1 capsule (50 mg total) by mouth 3 (three) times daily as needed. (Patient taking differently: Take 50 mg by mouth 2 (two) times daily as needed. ) 40 capsule 1  . albuterol (PROAIR HFA) 108 (90 Base) MCG/ACT inhaler Inhale 2 puffs into the lungs every 4 (four) hours as needed for wheezing or shortness of  breath. (Patient not taking: Reported on 05/31/2017) 1 Inhaler 0  . vitamin B-12 (CYANOCOBALAMIN) 1000 MCG tablet Take 1,000 mcg by mouth once a week.     No current facility-administered medications for this visit.       Marland Kitchen  PHYSICAL EXAMINATION: ECOG PERFORMANCE STATUS: 1 -  Symptomatic but completely ambulatory Vitals:   05/31/17 0845  BP: (!) 144/80  Pulse: 88  Temp: 98.2 F (36.8 C)   Filed Weights   05/31/17 0845  Weight: (!) 414 lb 4 oz (187.9 kg)   GENERAL: No distress, well nourished. Morbidly obese SKIN:  No rashes or significant lesions  HEAD: Normocephalic, No masses, lesions, tenderness or abnormalities  EYES: Conjunctiva are pale, non icteric ENT: External ears normal ,lips , buccal mucosa, and tongue normal and mucous membranes are moist  LYMPH: No palpable cervical and axillary lymphadenopathy  LUNGS: Clear to auscultation, no crackles or wheezes HEART: Regular rate & rhythm, no murmurs, no gallops, S1 normal and S2 normal  ABDOMEN: Abdomen soft, non-tender, normal bowel sounds, I did not appreciate any  masses or organomegaly  MUSCULOSKELETAL: No CVA tenderness and no tenderness on percussion of the back or rib cage.  EXTREMITIES: trace edemabilaterally, no skin discoloration or tenderness NEURO: Alert & oriented, no focal motor/sensory deficits.   LABORATORY DATA:  I have reviewed the data as listed Lab Results  Component Value Date   WBC 9.4 05/17/2017   HGB 9.9 (L) 05/17/2017   HCT 33.4 (L) 05/17/2017   MCV 70.3 (L) 05/17/2017   PLT 374 05/17/2017    Recent Labs  05/17/17 1135  NA 141  K 4.3  CL 107  CO2 26  GLUCOSE 84  BUN 9  CREATININE 0.78  CALCIUM 9.8  GFRNONAA 96  GFRAA 111  PROT 7.0  AST 15  ALT 17  BILITOT 0.3    RADIOGRAPHIC STUDIES: I have personally reviewed the radiological images as listed and agreed with the findings in the report.   ASSESSMENT & PLAN:  1. Iron deficiency anemia, unspecified iron deficiency anemia type   2. Microcytic anemia   3. Blood in the stool   4. Morbid obesity with body mass index (BMI) of 60.0 to 69.9 in adult [facility])    Discussed with patient that her anemia is likely secondary to iron deficiency. As patient reports that she is not compliant with oral medication, and  she is fairly symptomatic from anemia, I recommend IV iron supplementation. Plan IV iron with Venofer  weekly for 4 doses. Allergy reactions/infusion reaction including anaphylactic reaction discussed with patient. Patient voices understanding and willing to proceed. As she recently just had blood drawn with her primary care provider, I would not do additional lab work as of today.check occult stool 3. After she received 4 doses of IV iron,  will repeat CBC CMP, iron TIBC, ferritin, we'll also check celiac panel,B12, and folate.   All questions were answered. The patient knows to call the clinic with any problems questions or concerns.  Return of visit: ne week after she finished 4 doses of IV event of fever. Labs to be done one or 2 days prior to visit  Thank you for this kind referral and the opportunity to participate in the care of this patient. A copy of today's note is routed to referring provider Dr. Penne Lash, MD, PhD Hematology Oncology [facility] at [facility] Pager- [phone] 05/31/2017

## 2017-05-31 NOTE — Progress Notes (Signed)
*  PRELIMINARY RESULTS* Echocardiogram 2D Echocardiogram has been performed.  Melinda Fox 05/31/2017, 10:40 AM

## 2017-06-01 ENCOUNTER — Encounter: Payer: Self-pay | Admitting: Family Medicine

## 2017-06-01 DIAGNOSIS — I517 Cardiomegaly: Secondary | ICD-10-CM

## 2017-06-01 HISTORY — DX: Cardiomegaly: I51.7

## 2017-06-02 ENCOUNTER — Telehealth: Payer: Self-pay | Admitting: *Deleted

## 2017-06-02 ENCOUNTER — Telehealth: Payer: Self-pay | Admitting: Oncology

## 2017-06-02 NOTE — Telephone Encounter (Signed)
Usually no premedication before Venofer.

## 2017-06-02 NOTE — Telephone Encounter (Signed)
Patient is asking if she will be getting Benadryl with her iron infusion, if so will it be oral or IV and will it be normal dose or higher. Please adcvise

## 2017-06-07 ENCOUNTER — Inpatient Hospital Stay: Payer: BC Managed Care – PPO

## 2017-06-07 VITALS — BP 128/80 | HR 79 | Temp 98.2°F | Resp 20

## 2017-06-07 DIAGNOSIS — D509 Iron deficiency anemia, unspecified: Secondary | ICD-10-CM

## 2017-06-07 MED ORDER — SODIUM CHLORIDE 0.9 % IV SOLN
Freq: Once | INTRAVENOUS | Status: AC
  Administered 2017-06-07: 15:00:00 via INTRAVENOUS
  Filled 2017-06-07: qty 1000

## 2017-06-07 MED ORDER — IRON SUCROSE 20 MG/ML IV SOLN
200.0000 mg | Freq: Once | INTRAVENOUS | Status: AC
  Administered 2017-06-07: 200 mg via INTRAVENOUS
  Filled 2017-06-07: qty 10

## 2017-06-14 ENCOUNTER — Inpatient Hospital Stay: Payer: BC Managed Care – PPO

## 2017-06-14 DIAGNOSIS — D509 Iron deficiency anemia, unspecified: Secondary | ICD-10-CM

## 2017-06-14 MED ORDER — IRON SUCROSE 20 MG/ML IV SOLN
200.0000 mg | Freq: Once | INTRAVENOUS | Status: AC
  Administered 2017-06-14: 200 mg via INTRAVENOUS
  Filled 2017-06-14: qty 10

## 2017-06-14 MED ORDER — SODIUM CHLORIDE 0.9 % IV SOLN
Freq: Once | INTRAVENOUS | Status: AC
  Administered 2017-06-14: 15:00:00 via INTRAVENOUS
  Filled 2017-06-14: qty 1000

## 2017-06-21 ENCOUNTER — Inpatient Hospital Stay: Payer: BC Managed Care – PPO | Attending: Oncology

## 2017-06-21 VITALS — BP 124/81 | HR 82 | Temp 98.6°F | Resp 18

## 2017-06-21 DIAGNOSIS — R195 Other fecal abnormalities: Secondary | ICD-10-CM | POA: Insufficient documentation

## 2017-06-21 DIAGNOSIS — Z6841 Body Mass Index (BMI) 40.0 and over, adult: Secondary | ICD-10-CM | POA: Insufficient documentation

## 2017-06-21 DIAGNOSIS — R030 Elevated blood-pressure reading, without diagnosis of hypertension: Secondary | ICD-10-CM | POA: Diagnosis not present

## 2017-06-21 DIAGNOSIS — D509 Iron deficiency anemia, unspecified: Secondary | ICD-10-CM | POA: Insufficient documentation

## 2017-06-21 DIAGNOSIS — F329 Major depressive disorder, single episode, unspecified: Secondary | ICD-10-CM | POA: Diagnosis not present

## 2017-06-21 DIAGNOSIS — E079 Disorder of thyroid, unspecified: Secondary | ICD-10-CM | POA: Insufficient documentation

## 2017-06-21 DIAGNOSIS — G4733 Obstructive sleep apnea (adult) (pediatric): Secondary | ICD-10-CM | POA: Diagnosis not present

## 2017-06-21 DIAGNOSIS — Z79899 Other long term (current) drug therapy: Secondary | ICD-10-CM | POA: Diagnosis not present

## 2017-06-21 DIAGNOSIS — M419 Scoliosis, unspecified: Secondary | ICD-10-CM | POA: Insufficient documentation

## 2017-06-21 MED ORDER — IRON SUCROSE 20 MG/ML IV SOLN
200.0000 mg | Freq: Once | INTRAVENOUS | Status: AC
  Administered 2017-06-21: 200 mg via INTRAVENOUS
  Filled 2017-06-21: qty 10

## 2017-06-21 MED ORDER — SODIUM CHLORIDE 0.9 % IV SOLN
Freq: Once | INTRAVENOUS | Status: AC
  Administered 2017-06-21: 15:00:00 via INTRAVENOUS
  Filled 2017-06-21: qty 1000

## 2017-06-25 DIAGNOSIS — D509 Iron deficiency anemia, unspecified: Secondary | ICD-10-CM | POA: Diagnosis not present

## 2017-06-26 DIAGNOSIS — D509 Iron deficiency anemia, unspecified: Secondary | ICD-10-CM | POA: Diagnosis not present

## 2017-06-28 ENCOUNTER — Other Ambulatory Visit: Payer: Self-pay

## 2017-06-28 ENCOUNTER — Inpatient Hospital Stay: Payer: BC Managed Care – PPO

## 2017-06-28 VITALS — BP 145/82 | HR 84 | Resp 20

## 2017-06-28 DIAGNOSIS — D509 Iron deficiency anemia, unspecified: Secondary | ICD-10-CM | POA: Diagnosis not present

## 2017-06-28 LAB — OCCULT BLOOD X 1 CARD TO LAB, STOOL
FECAL OCCULT BLD: NEGATIVE
Fecal Occult Bld: NEGATIVE
Fecal Occult Bld: POSITIVE — AB

## 2017-06-28 MED ORDER — SODIUM CHLORIDE 0.9 % IV SOLN
Freq: Once | INTRAVENOUS | Status: AC
  Administered 2017-06-28: 15:00:00 via INTRAVENOUS
  Filled 2017-06-28: qty 1000

## 2017-06-28 MED ORDER — IRON SUCROSE 20 MG/ML IV SOLN
200.0000 mg | Freq: Once | INTRAVENOUS | Status: AC
  Administered 2017-06-28: 200 mg via INTRAVENOUS
  Filled 2017-06-28: qty 10

## 2017-07-12 ENCOUNTER — Other Ambulatory Visit: Payer: Self-pay | Admitting: *Deleted

## 2017-07-12 ENCOUNTER — Telehealth: Payer: Self-pay | Admitting: Family Medicine

## 2017-07-12 ENCOUNTER — Inpatient Hospital Stay: Payer: BC Managed Care – PPO

## 2017-07-12 DIAGNOSIS — K921 Melena: Secondary | ICD-10-CM

## 2017-07-12 DIAGNOSIS — D509 Iron deficiency anemia, unspecified: Secondary | ICD-10-CM | POA: Diagnosis not present

## 2017-07-12 LAB — IRON AND TIBC
IRON: 20 ug/dL — AB (ref 28–170)
Saturation Ratios: 6 % — ABNORMAL LOW (ref 10.4–31.8)
TIBC: 362 ug/dL (ref 250–450)
UIBC: 342 ug/dL

## 2017-07-12 LAB — CBC WITH DIFFERENTIAL/PLATELET
BASOS ABS: 0.1 10*3/uL (ref 0–0.1)
Basophils Relative: 1 %
EOS PCT: 0 %
Eosinophils Absolute: 0 10*3/uL (ref 0–0.7)
HEMATOCRIT: 36.5 % (ref 35.0–47.0)
Hemoglobin: 11.3 g/dL — ABNORMAL LOW (ref 12.0–16.0)
LYMPHS ABS: 2.2 10*3/uL (ref 1.0–3.6)
LYMPHS PCT: 26 %
MCH: 22.4 pg — AB (ref 26.0–34.0)
MCHC: 31 g/dL — ABNORMAL LOW (ref 32.0–36.0)
MCV: 72.4 fL — AB (ref 80.0–100.0)
Monocytes Absolute: 0.6 10*3/uL (ref 0.2–0.9)
Monocytes Relative: 7 %
NEUTROS ABS: 5.7 10*3/uL (ref 1.4–6.5)
Neutrophils Relative %: 66 %
PLATELETS: 304 10*3/uL (ref 150–440)
RBC: 5.05 MIL/uL (ref 3.80–5.20)
RDW: 20.3 % — ABNORMAL HIGH (ref 11.5–14.5)
WBC: 8.5 10*3/uL (ref 3.6–11.0)

## 2017-07-12 LAB — COMPREHENSIVE METABOLIC PANEL
ALT: 22 U/L (ref 14–54)
AST: 21 U/L (ref 15–41)
Albumin: 3.9 g/dL (ref 3.5–5.0)
Alkaline Phosphatase: 87 U/L (ref 38–126)
Anion gap: 8 (ref 5–15)
BUN: 11 mg/dL (ref 6–20)
CHLORIDE: 103 mmol/L (ref 101–111)
CO2: 27 mmol/L (ref 22–32)
CREATININE: 0.68 mg/dL (ref 0.44–1.00)
Calcium: 9.6 mg/dL (ref 8.9–10.3)
GFR calc non Af Amer: >60 mL/min (ref >60)
Glucose, Bld: 114 mg/dL — ABNORMAL HIGH (ref 65–99)
POTASSIUM: 3.6 mmol/L (ref 3.5–5.1)
SODIUM: 138 mmol/L (ref 135–145)
Total Bilirubin: <0.1 mg/dL — ABNORMAL LOW (ref 0.3–1.2)
Total Protein: 7.2 g/dL (ref 6.5–8.1)

## 2017-07-12 LAB — VITAMIN B12: VITAMIN B 12: 357 pg/mL (ref 180–914)

## 2017-07-12 LAB — FERRITIN: FERRITIN: 42 ng/mL (ref 11–307)

## 2017-07-12 LAB — TSH: TSH: 2.901 u[IU]/mL (ref 0.350–4.500)

## 2017-07-12 LAB — FOLATE: FOLATE: 10.9 ng/mL (ref >5.9)

## 2017-07-12 NOTE — Telephone Encounter (Signed)
Referral has been entered for patient to see GI for evaluation of iron deficiency anemia, positive hemoccult card Please contact patient and let her know that one of those stool cards was positive and hematologist and I would like her to see a gastroenterologist to see if she is losing blood from her GI tract

## 2017-07-12 NOTE — Telephone Encounter (Signed)
Called pt no answer. LM for pt informing her of of information below. CRM created.

## 2017-07-12 NOTE — Telephone Encounter (Signed)
-----   Message from Rickard PatienceZhou Yu, MD sent at 07/12/2017  2:38 PM EST ----- Regarding: RE: did you see that positive hemoccult? Will you refer patient to have GI work up?    ----- Message ----- From: Kerman PasseyLada, Melinda P, MD Sent: 07/12/2017   2:22 PM To: Rickard PatienceZhou Yu, MD Subject: did you see that positive hemoccult?

## 2017-07-13 ENCOUNTER — Ambulatory Visit: Payer: BC Managed Care – PPO | Admitting: Family Medicine

## 2017-07-13 ENCOUNTER — Encounter: Payer: Self-pay | Admitting: Family Medicine

## 2017-07-13 VITALS — BP 144/82 | HR 93 | Temp 98.3°F | Ht 68.0 in | Wt >= 6400 oz

## 2017-07-13 DIAGNOSIS — D509 Iron deficiency anemia, unspecified: Secondary | ICD-10-CM

## 2017-07-13 DIAGNOSIS — J029 Acute pharyngitis, unspecified: Secondary | ICD-10-CM

## 2017-07-13 DIAGNOSIS — I517 Cardiomegaly: Secondary | ICD-10-CM | POA: Diagnosis not present

## 2017-07-13 DIAGNOSIS — Z6841 Body Mass Index (BMI) 40.0 and over, adult: Secondary | ICD-10-CM

## 2017-07-13 DIAGNOSIS — R030 Elevated blood-pressure reading, without diagnosis of hypertension: Secondary | ICD-10-CM | POA: Insufficient documentation

## 2017-07-13 LAB — POCT RAPID STREP A (OFFICE): RAPID STREP A SCREEN: NEGATIVE

## 2017-07-13 NOTE — Progress Notes (Signed)
Hematology/Oncology Consult note [facility] Telephone:([phone] [phone]   Patient Care Team: Lada, Janit Bern, MD as PCP - General (Family Medicine)  REFERRING PROVIDER: Kerman Passey, MD CHIEF COMPLAINTS/PURPOSE OF CONSULTATION:  Anemia evaluation  HISTORY OF PRESENTING ILLNESS:  Melinda Fox is a  41 y.o.  female with PMH listed below who was referred by xxx to me for evaluation of anemia. Patient reports that she has long-standing anemia history since she was teenage. She says that she'll occasionally see blood in the stool. Her menstrual period is regular heavy on the first 2 days and then normal flow. She is morbidly obese. She denies taking any medication currently and also reports that she is noncompliant with any oral medications. She used to take vitamin B-12 oral supplementations for antigen post. She is adopted so she does not know her family history. 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 Her kids a vegetarian but she cooks chicken or fish a few times a week for herself.eports feeling fatigued lack of energy, shortness of breath with exertion,denies any abdominal pain, history of clots.   INTERVAL HISTORY Patient presents for follow up of iron deficiency anemia. She subjectively feels no improvement of her energy level. She tells me that she is frustrated that she has to see so many doctors, and I am the 9th doctor she sees or she brings her kids to see for the past 2 weeks.   Review of Systems  Constitutional: Positive for malaise/fatigue. Negative for fever and weight loss.  HENT: Negative for hearing loss.   Eyes: Negative for blurred vision.  Respiratory: Negative for cough.   Cardiovascular: Negative for chest pain.  Gastrointestinal: Negative for heartburn.  Genitourinary: Negative for dysuria.  Musculoskeletal: Negative for myalgias.  Skin: Negative for rash.  Neurological: Negative for dizziness.    Endo/Heme/Allergies: Does not bruise/bleed easily.  Psychiatric/Behavioral: Negative for depression.     MEDICAL HISTORY:  Past Medical History:  Diagnosis Date  . Allergy   . Anemia   . Chronic migraine without aura without status migrainosus, not intractable   . Depression   . Elevated blood-pressure reading without diagnosis of hypertension   . Moderate concentric left ventricular hypertrophy 06/01/2017   Echocardiogram October 2018  . Morbid obesity with body mass index (BMI) of 60.0 to 69.9 in adult [facility]) 07/01/2015  . Scoliosis   . Sleep apnea, obstructive   . Thyroid disease     SURGICAL HISTORY: No past surgical history on file.  SOCIAL HISTORY: Social History   Socioeconomic History  . Marital status: Single    Spouse name: Not on file  . Number of children: Not on file  . Years of education: Not on file  . Highest education level: Not on file  Social Needs  . Financial resource strain: Not on file  . Food insecurity - worry: Not on file  . Food insecurity - inability: Not on file  . Transportation needs - medical: Not on file  . Transportation needs - non-medical: Not on file  Occupational History  . Not on file  Tobacco Use  . Smoking status: Never Smoker  . Smokeless tobacco: Never Used  Substance and Sexual Activity  . Alcohol use: No    Alcohol/week: 0.0 oz    Comment: once a year  . Drug use: No  . Sexual activity: No  Other Topics Concern  . Not on file  Social History Narrative  . Not on file  FAMILY HISTORY: Family History  Adopted: Yes  Family history unknown: Yes    ALLERGIES:  is allergic to sulfa antibiotics.  MEDICATIONS:  Current Outpatient Medications  Medication Sig Dispense Refill  . albuterol (PROAIR HFA) 108 (90 Base) MCG/ACT inhaler Inhale 2 puffs into the lungs every 4 (four) hours as needed for wheezing or shortness of breath. (Patient not taking: Reported on 05/31/2017) 1 Inhaler 0  . indomethacin (INDOCIN) 50 MG  capsule Take 1 capsule (50 mg total) by mouth 3 (three) times daily as needed. (Patient taking differently: Take 50 mg by mouth 2 (two) times daily as needed. ) 40 capsule 1  . vitamin B-12 (CYANOCOBALAMIN) 1000 MCG tablet Take 1,000 mcg by mouth once a week.     No current facility-administered medications for this visit.       Marland Kitchen.  PHYSICAL EXAMINATION: ECOG PERFORMANCE STATUS: 1 - Symptomatic but completely ambulatory Vitals:   07/15/17 1445  BP: (!) 150/97  Pulse: 83  Resp: 20  Temp: (!) 97.5 F (36.4 C)   Filed Weights   07/15/17 1445  Weight: (!) 409 lb 2 oz (185.6 kg)   GENERAL: No distress, well nourished. Morbidly obese SKIN:  No rashes or significant lesions  HEAD: Normocephalic, No masses, lesions, tenderness or abnormalities  EYES: Conjunctiva are pink, non icteric ENT: External ears normal ,lips , buccal mucosa, and tongue normal and mucous membranes are moist  LYMPH: No palpable cervical and axillary lymphadenopathy  LUNGS: Clear to auscultation, no crackles or wheezes HEART: Regular rate & rhythm, no murmurs, no gallops, S1 normal and S2 normal  ABDOMEN: Abdomen soft, non-tender, normal bowel sounds, I did not appreciate any  masses or organomegaly  MUSCULOSKELETAL: No CVA tenderness and no tenderness on percussion of the back or rib cage.  EXTREMITIES: No edema, no skin discoloration or tenderness NEURO: Alert & oriented, no focal motor/sensory deficits.   LABORATORY DATA:  I have reviewed the data as listed Lab Results  Component Value Date   WBC 8.5 07/12/2017   HGB 11.3 (L) 07/12/2017   HCT 36.5 07/12/2017   MCV 72.4 (L) 07/12/2017   PLT 304 07/12/2017   Recent Labs    05/17/17 1135 07/12/17 1610  NA 141 138  K 4.3 3.6  CL 107 103  CO2 26 27  GLUCOSE 84 114*  BUN 9 11  CREATININE 0.78 0.68  CALCIUM 9.8 9.6  GFRNONAA 96 >60  GFRAA 111 >60  PROT 7.0 7.2  ALBUMIN  --  3.9  AST 15 21  ALT 17 22  ALKPHOS  --  87  BILITOT 0.3 <0.1*    Occult stool positive.   ASSESSMENT & PLAN:  1. Iron deficiency anemia, unspecified iron deficiency anemia type   2. Occult blood positive stool   3. Elevated BP without diagnosis of hypertension    # S/p IV Venofer weekly x 4, iron store improved and her hemoglobin improved. However clinically she reports no improvement. Will hold additional IV iron infusion. Monitor counts in 3 months.   # positive stool occult. She has been referred to GI already.  Unknown family history as she is adopted.   # Elevated BP, advise patient to follow up w primary, may benefit from starting BP meds.  # Morbid obesity: life style modification discussed briefly. She is in a bad mood today and not interested.   All questions were answered. The patient knows to call the clinic with any problems questions or concerns. Return of visit: 3  months with repeat cbc, iron tibc ferritin. Labs to be done one or 2 days prior to visit  Thank you for this kind referral and the opportunity to participate in the care of this patient. A copy of today's note is routed to referring provider Dr. Penne LashLada   Normand Damron, MD, PhD Hematology Oncology [facility] Pager- [phone] 07/13/2017

## 2017-07-13 NOTE — Assessment & Plan Note (Signed)
Weight loss encouraged; see AVS; she has been referred to a bariatric surgeon

## 2017-07-13 NOTE — Patient Instructions (Addendum)
I'll encourage you to follow up on the bariatric referral (Dr. Daphine DeutscherMartin); that was put in for you in early October; my staff can assist you with getting a phone number or scheduling another appointment if needed Your heart is enlarged, most likely due to your morbid obesity; this can lead to heart failure if we don't do something to get this extra weight off of you  Try to follow the DASH guidelines (DASH stands for Dietary Approaches to Stop Hypertension). Try to limit the sodium in your diet to no more than 1,500mg  of sodium per day. Certainly try to not exceed 2,000 mg per day at the very most. Do not add salt when cooking or at the table.  Check the sodium amount on labels when shopping, and choose items lower in sodium when given a choice. Avoid or limit foods that already contain a lot of sodium. Eat a diet rich in fruits and vegetables and whole grains, and try to lose weight if overweight or obese If your blood pressure does not return to normal with lifestyle changes, we'll discuss starting blood pressure medicine at your next visit Please return in 4 weeks for reassessment  Please do see Dr. Allegra LaiVanga, the gastroenterologist, about the blood in your stool; that indicates that blood is being lost somewhere between your esophagus and your rectum; this can be from esophagitis, gastritis, duodenitis, polyps, or even cancer, so please do keep that appointment  Try vitamin C (orange juice if not diabetic or vitamin C tablets) and drink green tea to help your immune system during your illness Get plenty of rest and hydration   Pharyngitis Pharyngitis is a sore throat (pharynx). There is redness, pain, and swelling of your throat. Follow these instructions at home:  Drink enough fluids to keep your pee (urine) clear or pale yellow.  Only take medicine as told by your doctor. ? You may get sick again if you do not take medicine as told. Finish your medicines, even if you start to feel better. ? Do  not take aspirin.  Rest.  Rinse your mouth (gargle) with salt water ( tsp of salt per 1 qt of water) every 1-2 hours. This will help the pain.  If you are not at risk for choking, you can suck on hard candy or sore throat lozenges. Contact a doctor if:  You have large, tender lumps on your neck.  You have a rash.  You cough up green, yellow-brown, or bloody spit. Get help right away if:  You have a stiff neck.  You drool or cannot swallow liquids.  You throw up (vomit) or are not able to keep medicine or liquids down.  You have very bad pain that does not go away with medicine.  You have problems breathing (not from a stuffy nose). This information is not intended to replace advice given to you by your health care provider. Make sure you discuss any questions you have with your health care provider. Document Released: 01/19/2008 Document Revised: 01/08/2016 Document Reviewed: 04/09/2013 Elsevier Interactive Patient Education  2017 [employer].  Bariatric Surgery Information Bariatric surgery, also called weight loss surgery, is a procedure that helps you lose weight. You may consider or your health care provider may suggest bariatric surgery if:  You are severely obese and have been unable to lose weight through diet and exercise.  You have health problems related to obesity, such as: ? Type 2 diabetes. ? Heart disease. ? Lung disease.  How does bariatric surgery  help me lose weight? Bariatric surgery helps you lose weight by decreasing how much food your body absorbs. This is done by closing off part of your stomach to make it smaller. This restricts the amount of food your stomach can hold. Bariatric surgery can also change your body's regular digestive process, so that food bypasses the parts of your body that absorb calories and nutrients. If you decide to have bariatric surgery, it is important to continue to eat a healthy diet and exercise regularly after the  surgery. What are the different kinds of bariatric surgery? There are two kinds of bariatric surgeries:  Restrictive surgeries make your stomach smaller. They do not change your digestive process. The smaller the size of your new stomach, the less food you can eat. There are different types of restrictive surgeries.  Malabsorptive surgeries both make your stomach smaller and alter your digestive process so that your body processes less calories and nutrients. These are the most common kind of bariatric surgery. There are different types of malabsorptive surgeries.  What are the different types of restrictive surgery? Adjustable Gastric Banding In this procedure, an inflatable band is placed around your stomach near the upper end. This makes the passageway for food into the rest of your stomach much smaller. The band can be adjusted, making it tighter or looser, by filling it with salt solution. Your surgeon can adjust the band based on how are you feeling and how much weight you are losing. The band can be removed in the future. Vertical Banded Gastroplasty In this procedure, staples are used to separate your stomach into two parts, a small upper pouch and a bigger lower pouch. This decreases how much food you can eat. Sleeve Gastrectomy In this procedure, your stomach is made smaller. This is done by surgically removing a large part of your stomach. When your stomach is smaller, you feel full more quickly and reduce how much you eat. What are the different types of malabsorptive surgery? Roux-en-Y Gastric Bypass (RGB) This is the most common weight loss surgery. In this procedure, a small stomach pouch is created in the upper part of your stomach. Next, this small stomach pouch is attached directly to the middle part of your small intestine. The farther down your small intestine the new connection is made, the fewer calories and nutrients you will absorb. Biliopancreatic Diversion with Duodenal  Switch (BPD/DS) This is a multi-step procedure. In this procedure, a large part of your stomach is removed, making your stomach smaller. Next, this smaller stomach is attached to the lower part of your small intestine. Like the RGB surgery, you absorb fewer calories and nutrients the farther down your small intestine the attachment is made. What are the risks of bariatric surgery? As with any surgical procedure, each type of bariatric surgery has its own risks. These risks also depend on your age, your overall health, and any other medical conditions you may have. When deciding on bariatric surgery, it is very important to:  Talk to your health care provider and choose the surgery that is best for you.  Ask your health care provider about specific risks for the surgery you choose.  Where to find more information:  American Society for Metabolic & Bariatric Surgery: www.asmbs.org  Weight-control Information Network (WIN): win.StageSync.si This information is not intended to replace advice given to you by your health care provider. Make sure you discuss any questions you have with your health care provider. Document Released: 08/02/2005 Document Revised: 01/08/2016  Document Reviewed: 01/31/2013 Elsevier Interactive Patient Education  2017 Else[employer] Inc.  Obesity, Adult Obesity is having too much body fat. If you have a BMI of 30 or more, you are obese. BMI is a number that explains how much body fat you have. Obesity is often caused by taking in (consuming) more calories than your body uses. Obesity can cause serious health problems. Changing your lifestyle can help to treat obesity. Follow these instructions at home: Eating and drinking   Follow advice from your doctor about what to eat and drink. Your doctor may tell you to: ? Cut down on (limit) fast foods, sweets, and processed snack foods. ? Choose low-fat options. For example, choose low-fat milk instead of whole milk. ? Eat 5 or  more servings of fruits or vegetables every day. ? Eat at home more often. This gives you more control over what you eat. ? Choose healthy foods when you eat out. ? Learn what a healthy portion size is. A portion size is the amount of a certain food that is healthy for you to eat at one time. This is different for each person. ? Keep low-fat snacks available. ? Avoid sugary drinks. These include soda, fruit juice, iced tea that is sweetened with sugar, and flavored milk. ? Eat a healthy breakfast.  Drink enough water to keep your pee (urine) clear or pale yellow.  Do not go without eating for long periods of time (do not fast).  Do not go on popular or trendy diets (fad diets). Physical Activity  Exercise often, as told by your doctor. Ask your doctor: ? What types of exercise are safe for you. ? How often you should exercise.  Warm up and stretch before being active.  Do slow stretching after being active (cool down).  Rest between times of being active. Lifestyle  Limit how much time you spend in front of your TV, computer, or video game system (be less sedentary).  Find ways to reward yourself that do not involve food.  Limit alcohol intake to no more than 1 drink a day for nonpregnant women and 2 drinks a day for men. One drink equals 12 oz of beer, 5 oz of wine, or 1 oz of hard liquor. General instructions  Keep a weight loss journal. This can help you keep track of: ? The food that you eat. ? The exercise that you do.  Take over-the-counter and prescription medicines only as told by your doctor.  Take vitamins and supplements only as told by your doctor.  Think about joining a support group. Your doctor may be able to help with this.  Keep all follow-up visits as told by your doctor. This is important. Contact a doctor if:  You cannot meet your weight loss goal after you have changed your diet and lifestyle for 6 weeks. This information is not intended to replace  advice given to you by your health care provider. Make sure you discuss any questions you have with your health care provider. Document Released: 10/25/2011 Document Revised: 01/08/2016 Document Reviewed: 05/21/2015 Elsevier Interactive Patient Education  2018 [employer].  DASH Eating Plan DASH stands for "Dietary Approaches to Stop Hypertension." The DASH eating plan is a healthy eating plan that has been shown to reduce high blood pressure (hypertension). It may also reduce your risk for type 2 diabetes, heart disease, and stroke. The DASH eating plan may also help with weight loss. What are tips for following this plan? General guidelines  Avoid  eating more than 2,300 mg (milligrams) of salt (sodium) a day. If you have hypertension, you may need to reduce your sodium intake to 1,500 mg a day.  Limit alcohol intake to no more than 1 drink a day for nonpregnant women and 2 drinks a day for men. One drink equals 12 oz of beer, 5 oz of wine, or 1 oz of hard liquor.  Work with your health care provider to maintain a healthy body weight or to lose weight. Ask what an ideal weight is for you.  Get at least 30 minutes of exercise that causes your heart to beat faster (aerobic exercise) most days of the week. Activities may include walking, swimming, or biking.  Work with your health care provider or diet and nutrition specialist (dietitian) to adjust your eating plan to your individual calorie needs. Reading food labels  Check food labels for the amount of sodium per serving. Choose foods with less than 5 percent of the Daily Value of sodium. Generally, foods with less than 300 mg of sodium per serving fit into this eating plan.  To find whole grains, look for the word "whole" as the first word in the ingredient list. Shopping  Buy products labeled as "low-sodium" or "no salt added."  Buy fresh foods. Avoid canned foods and premade or frozen meals. Cooking  Avoid adding salt when  cooking. Use salt-free seasonings or herbs instead of table salt or sea salt. Check with your health care provider or pharmacist before using salt substitutes.  Do not fry foods. Cook foods using healthy methods such as baking, boiling, grilling, and broiling instead.  Cook with heart-healthy oils, such as olive, canola, soybean, or sunflower oil. Meal planning   Eat a balanced diet that includes: ? 5 or more servings of fruits and vegetables each day. At each meal, try to fill half of your plate with fruits and vegetables. ? Up to 6-8 servings of whole grains each day. ? Less than 6 oz of lean meat, poultry, or fish each day. A 3-oz serving of meat is about the same size as a deck of cards. One egg equals 1 oz. ? 2 servings of low-fat dairy each day. ? A serving of nuts, seeds, or beans 5 times each week. ? Heart-healthy fats. Healthy fats called Omega-3 fatty acids are found in foods such as flaxseeds and coldwater fish, like sardines, salmon, and mackerel.  Limit how much you eat of the following: ? Canned or prepackaged foods. ? Food that is high in trans fat, such as fried foods. ? Food that is high in saturated fat, such as fatty meat. ? Sweets, desserts, sugary drinks, and other foods with added sugar. ? Full-fat dairy products.  Do not salt foods before eating.  Try to eat at least 2 vegetarian meals each week.  Eat more home-cooked food and less restaurant, buffet, and fast food.  When eating at a restaurant, ask that your food be prepared with less salt or no salt, if possible. What foods are recommended? The items listed may not be a complete list. Talk with your dietitian about what dietary choices are best for you. Grains Whole-grain or whole-wheat bread. Whole-grain or whole-wheat pasta. Brown rice. Orpah Cobbatmeal. Quinoa. Bulgur. Whole-grain and low-sodium cereals. Pita bread. Low-fat, low-sodium crackers. Whole-wheat flour tortillas. Vegetables Fresh or frozen vegetables  (raw, steamed, roasted, or grilled). Low-sodium or reduced-sodium tomato and vegetable juice. Low-sodium or reduced-sodium tomato sauce and tomato paste. Low-sodium or reduced-sodium canned vegetables. Fruits All  fresh, dried, or frozen fruit. Canned fruit in natural juice (without added sugar). Meat and other protein foods Skinless chicken or [place]. Ground chicken or [place]. Pork with fat trimmed off. Fish and seafood. Egg whites. Dried beans, peas, or lentils. Unsalted nuts, nut butters, and seeds. Unsalted canned beans. Lean cuts of beef with fat trimmed off. Low-sodium, lean deli meat. Dairy Low-fat (1%) or fat-free (skim) milk. Fat-free, low-fat, or reduced-fat cheeses. Nonfat, low-sodium ricotta or cottage cheese. Low-fat or nonfat yogurt. Low-fat, low-sodium cheese. Fats and oils Soft margarine without trans fats. Vegetable oil. Low-fat, reduced-fat, or light mayonnaise and salad dressings (reduced-sodium). Canola, safflower, olive, soybean, and sunflower oils. Avocado. Seasoning and other foods Herbs. Spices. Seasoning mixes without salt. Unsalted popcorn and pretzels. Fat-free sweets. What foods are not recommended? The items listed may not be a complete list. Talk with your dietitian about what dietary choices are best for you. Grains Baked goods made with fat, such as croissants, muffins, or some breads. Dry pasta or rice meal packs. Vegetables Creamed or fried vegetables. Vegetables in a cheese sauce. Regular canned vegetables (not low-sodium or reduced-sodium). Regular canned tomato sauce and paste (not low-sodium or reduced-sodium). Regular tomato and vegetable juice (not low-sodium or reduced-sodium). Rosita Fire. Olives. Fruits Canned fruit in a light or heavy syrup. Fried fruit. Fruit in cream or butter sauce. Meat and other protein foods Fatty cuts of meat. Ribs. Fried meat. Tomasa Blase. Sausage. Bologna and other processed lunch meats. Salami. Fatback. Hotdogs. Bratwurst. Salted nuts  and seeds. Canned beans with added salt. Canned or smoked fish. Whole eggs or egg yolks. Chicken or [place] with skin. Dairy Whole or 2% milk, cream, and half-and-half. Whole or full-fat cream cheese. Whole-fat or sweetened yogurt. Full-fat cheese. Nondairy creamers. Whipped toppings. Processed cheese and cheese spreads. Fats and oils Butter. Stick margarine. Lard. Shortening. Ghee. Bacon fat. Tropical oils, such as coconut, palm kernel, or palm oil. Seasoning and other foods Salted popcorn and pretzels. Onion salt, garlic salt, seasoned salt, table salt, and sea salt. Worcestershire sauce. Tartar sauce. Barbecue sauce. Teriyaki sauce. Soy sauce, including reduced-sodium. Steak sauce. Canned and packaged gravies. Fish sauce. Oyster sauce. Cocktail sauce. Horseradish that you find on the shelf. Ketchup. Mustard. Meat flavorings and tenderizers. Bouillon cubes. Hot sauce and Tabasco sauce. Premade or packaged marinades. Premade or packaged taco seasonings. Relishes. Regular salad dressings. Where to find more information:  National Heart, Lung, and Blood Institute: PopSteam.is  American Heart Association: www.heart.org Summary  The DASH eating plan is a healthy eating plan that has been shown to reduce high blood pressure (hypertension). It may also reduce your risk for type 2 diabetes, heart disease, and stroke.  With the DASH eating plan, you should limit salt (sodium) intake to 2,300 mg a day. If you have hypertension, you may need to reduce your sodium intake to 1,500 mg a day.  When on the DASH eating plan, aim to eat more fresh fruits and vegetables, whole grains, lean proteins, low-fat dairy, and heart-healthy fats.  Work with your health care provider or diet and nutrition specialist (dietitian) to adjust your eating plan to your individual calorie needs. This information is not intended to replace advice given to you by your health care provider. Make sure you discuss any questions  you have with your health care provider. Document Released: 07/22/2011 Document Revised: 07/26/2016 Document Reviewed: 07/26/2016 Elsevier Interactive Patient Education  2017 [employer].

## 2017-07-13 NOTE — Progress Notes (Signed)
BP (!) 144/82 (BP Location: Right Wrist, Patient Position: Sitting, Cuff Size: Normal)   Pulse 93   Temp 98.3 F (36.8 C) (Oral)   Ht 5\' 8"  (1.727 m)   Wt (!) 409 lb 6.4 oz (185.7 kg)   LMP 06/29/2017   SpO2 96%   BMI 62.25 kg/m    Subjective:    Patient ID: Melinda Fox, female    DOB: Sep 09, 1975, 41 y.o.   MRN: [id number]  HPI: Melinda Fox is a 41 y.o. female  Chief Complaint  Patient presents with  . Sore Throat  . Headache    HPI Patient is here for a sick visit Usually doesn't run fevers Headache and  Normal body temp more like 94 so today is a big increase Not much aching No rash No travel Works in a school  Since last visit, though, she has seen hematologist; she was found to have heme positive stool x 1 and was referred to gastroenterologist; she was confused about this though, as her phone translated the voice message to "[facility] [employer] [facility]"  She was referred to bariatric surgery in October but has not gone; says she can't afford it  Depression [facility] 2/9 07/13/2017 05/17/2017 01/27/2017 11/10/2016 07/01/2015  Decreased Interest 0 0 0 0 3  Down, Depressed, Hopeless 0 1 1 1 3   PHQ - 2 Score 0 1 1 1 6   Altered sleeping - - - - 3  Tired, decreased energy - - - - 3  Change in appetite - - - - 3  Feeling bad or failure about yourself  - - - - 3  Trouble concentrating - - - - 3  Moving slowly or fidgety/restless - - - - 3  Suicidal thoughts - - - - 0  PHQ-9 Score - - - - 24  Difficult doing work/chores - - - - Very difficult    Relevant past medical, surgical, family and social history reviewed Past Medical History:  Diagnosis Date  . Allergy   . Anemia   . Chronic migraine without aura without status migrainosus, not intractable   . Depression   . Elevated blood-pressure reading without diagnosis of hypertension   . Moderate concentric left ventricular hypertrophy 06/01/2017   Echocardiogram October 2018  . Morbid obesity with  body mass index (BMI) of 60.0 to 69.9 in adult [facility](HCC) 07/01/2015  . Scoliosis   . Sleep apnea, obstructive   . Thyroid disease    History reviewed. No pertinent surgical history. Family History  Adopted: Yes  Family history unknown: Yes   Social History   Tobacco Use  . Smoking status: Never Smoker  . Smokeless tobacco: Never Used  Substance Use Topics  . Alcohol use: No    Alcohol/week: 0.0 oz    Comment: once a year  . Drug use: No    Interim medical history since last visit reviewed. Allergies and medications reviewed  Review of Systems Per HPI unless specifically indicated above     Objective:    BP (!) 144/82 (BP Location: Right Wrist, Patient Position: Sitting, Cuff Size: Normal)   Pulse 93   Temp 98.3 F (36.8 C) (Oral)   Ht 5\' 8"  (1.727 m)   Wt (!) 409 lb 6.4 oz (185.7 kg)   LMP 06/29/2017   SpO2 96%   BMI 62.25 kg/m   Wt Readings from Last 3 Encounters:  07/13/17 (!) 409 lb 6.4 oz (185.7 kg)  05/31/17 (!) 414 lb 4 oz (187.9 kg)  05/17/17 Marland Kitchen(!)  415 lb 12.8 oz (188.6 kg)    Physical Exam  Constitutional: She appears well-developed and well-nourished. No distress.  HENT:  Head: Normocephalic and atraumatic.  Right Ear: Tympanic membrane, external ear and ear canal normal. Tympanic membrane is not erythematous. No middle ear effusion.  Left Ear: Tympanic membrane, external ear and ear canal normal. Tympanic membrane is not erythematous.  No middle ear effusion.  Nose: No rhinorrhea.  Mouth/Throat: Mucous membranes are normal. Posterior oropharyngeal erythema present. No oropharyngeal exudate or posterior oropharyngeal edema.  Eyes: No scleral icterus.  Neck: No thyromegaly present.  Cardiovascular: Normal rate, regular rhythm and normal heart sounds.  Pulmonary/Chest: Effort normal and breath sounds normal. No respiratory distress.  Abdominal:  Morbidly obese  Musculoskeletal: She exhibits no edema.  Lymphadenopathy:    She has cervical adenopathy.    Shoddy to 1+ node on the LEFT, shoddy on the right; no posterior cervical nodes  Neurological: She is alert. She exhibits normal muscle tone.  Skin: Skin is warm and dry. She is not diaphoretic. No pallor.  Psychiatric: She has a normal mood and affect. Her behavior is normal. Her mood appears not anxious. She does not exhibit a depressed mood.    Results for orders placed or performed in visit on 07/12/17  Iron and TIBC  Result Value Ref Range   Iron 20 (L) 28 - 170 ug/dL   TIBC 409 811 - 914 ug/dL   Saturation Ratios 6 (L) 10.4 - 31.8 %   UIBC 342 ug/dL  TSH  Result Value Ref Range   TSH 2.901 0.350 - 4.500 uIU/mL  Folate  Result Value Ref Range   Folate 10.9 >5.9 ng/mL  Vitamin B12  Result Value Ref Range   Vitamin B-12 357 180 - 914 pg/mL  Comprehensive metabolic panel  Result Value Ref Range   Sodium 138 135 - 145 mmol/L   Potassium 3.6 3.5 - 5.1 mmol/L   Chloride 103 101 - 111 mmol/L   CO2 27 22 - 32 mmol/L   Glucose, Bld 114 (H) 65 - 99 mg/dL   BUN 11 6 - 20 mg/dL   Creatinine, Ser 7.82 0.44 - 1.00 mg/dL   Calcium 9.6 8.9 - 95.6 mg/dL   Total Protein 7.2 6.5 - 8.1 g/dL   Albumin 3.9 3.5 - 5.0 g/dL   AST 21 15 - 41 U/L   ALT 22 14 - 54 U/L   Alkaline Phosphatase 87 38 - 126 U/L   Total Bilirubin <0.1 (L) 0.3 - 1.2 mg/dL   GFR calc non Af Amer >60 >60 mL/min   GFR calc Af Amer >60 >60 mL/min   Anion gap 8 5 - 15  Ferritin  Result Value Ref Range   Ferritin 42 11 - 307 ng/mL  CBC with Differential/Platelet  Result Value Ref Range   WBC 8.5 3.6 - 11.0 K/uL   RBC 5.05 3.80 - 5.20 MIL/uL   Hemoglobin 11.3 (L) 12.0 - 16.0 g/dL   HCT 21.3 08.6 - 57.8 %   MCV 72.4 (L) 80.0 - 100.0 fL   MCH 22.4 (L) 26.0 - 34.0 pg   MCHC 31.0 (L) 32.0 - 36.0 g/dL   RDW 46.9 (H) 62.9 - 52.8 %   Platelets 304 150 - 440 K/uL   Neutrophils Relative % 66 %   Neutro Abs 5.7 1.4 - 6.5 K/uL   Lymphocytes Relative 26 %   Lymphs Abs 2.2 1.0 - 3.6 K/uL   Monocytes Relative 7 %  Monocytes Absolute 0.6 0.2 - 0.9 K/uL   Eosinophils Relative 0 %   Eosinophils Absolute 0.0 0 - 0.7 K/uL   Basophils Relative 1 %   Basophils Absolute 0.1 0 - 0.1 K/uL      Assessment & Plan:   Problem List Items Addressed This Visit      Cardiovascular and Mediastinum   Moderate concentric left ventricular hypertrophy    Weight loss encouraged; see AVS; she has been referred to a bariatric surgeon        Other   Morbid obesity with body mass index (BMI) of 60.0 to 69.9 in adult [facility](HCC)    Encouraged her to follow through on the bariatric referral      Iron deficiency anemia    Seeing hematologist; heme-positive stool, so stressed the importance of working that up      Elevated blood pressure reading    Last three readings have not been in hypertension range; encouraged DASH guidelines, weight loss       Other Visit Diagnoses    Pharyngitis, unspecified etiology    -  Primary   Relevant Orders   POCT rapid strep A   Culture, Group A Strep       Follow up plan: Return in about 4 weeks (around 08/10/2017) for follow-up visit with Dr. Sherie DonLada.  An after-visit summary was printed and given to the patient at check-out.  Please see the patient instructions which may contain other information and recommendations beyond what is mentioned above in the assessment and plan.  No orders of the defined types were placed in this encounter.   Orders Placed This Encounter  Procedures  . Culture, Group A Strep  . POCT rapid strep A   Strep test: negative, culture pending

## 2017-07-13 NOTE — Assessment & Plan Note (Signed)
Seeing hematologist; heme-positive stool, so stressed the importance of working that up

## 2017-07-13 NOTE — Assessment & Plan Note (Signed)
Last three readings have not been in hypertension range; encouraged DASH guidelines, weight loss

## 2017-07-13 NOTE — Assessment & Plan Note (Signed)
Encouraged her to follow through on the bariatric referral

## 2017-07-14 LAB — CELIAC PANEL 10
ANTIGLIADIN ABS, IGA: 4 U (ref 0–19)
Endomysial Ab, IgA: NEGATIVE
GLIADIN IGG: 2 U (ref 0–19)
IgA: 223 mg/dL (ref 87–352)
Tissue Transglut Ab: <2 U/mL (ref 0–5)
Tissue Transglutaminase Ab, IgA: <2 U/mL (ref 0–3)

## 2017-07-14 LAB — CULTURE, GROUP A STREP
MICRO NUMBER:: 81336302
SPECIMEN QUALITY:: ADEQUATE

## 2017-07-15 ENCOUNTER — Inpatient Hospital Stay (HOSPITAL_BASED_OUTPATIENT_CLINIC_OR_DEPARTMENT_OTHER): Payer: BC Managed Care – PPO | Admitting: Oncology

## 2017-07-15 VITALS — BP 150/97 | HR 83 | Temp 97.5°F | Resp 20 | Wt >= 6400 oz

## 2017-07-15 DIAGNOSIS — R030 Elevated blood-pressure reading, without diagnosis of hypertension: Secondary | ICD-10-CM | POA: Diagnosis not present

## 2017-07-15 DIAGNOSIS — R195 Other fecal abnormalities: Secondary | ICD-10-CM | POA: Diagnosis not present

## 2017-07-15 DIAGNOSIS — Z6841 Body Mass Index (BMI) 40.0 and over, adult: Secondary | ICD-10-CM

## 2017-07-15 DIAGNOSIS — D509 Iron deficiency anemia, unspecified: Secondary | ICD-10-CM

## 2017-07-15 DIAGNOSIS — Z79899 Other long term (current) drug therapy: Secondary | ICD-10-CM

## 2017-07-15 NOTE — Progress Notes (Signed)
Pt in for follow up, reports feeling tired, fatigued.  Pt verbalized unhappiness of being in clinic today.

## 2017-07-18 ENCOUNTER — Encounter: Payer: Self-pay | Admitting: Gastroenterology

## 2017-07-18 ENCOUNTER — Ambulatory Visit: Payer: BC Managed Care – PPO | Admitting: Gastroenterology

## 2017-07-18 VITALS — BP 122/83 | HR 81 | Temp 98.5°F | Ht 68.0 in | Wt >= 6400 oz

## 2017-07-18 DIAGNOSIS — D500 Iron deficiency anemia secondary to blood loss (chronic): Secondary | ICD-10-CM

## 2017-07-18 DIAGNOSIS — R195 Other fecal abnormalities: Secondary | ICD-10-CM

## 2017-07-18 NOTE — Progress Notes (Signed)
Melinda BouillonVarnita Shanielle Fox [address]  [address]  [place], [place] [address]  Main: [phone]  Fax: [phone]   Gastroenterology Consultation  Referring Provider:     Kerman PasseyLada, Melinda P, MD Primary Care Physician:  Kerman PasseyLada, Melinda P, MD Primary Gastroenterologist:  Dr. Melodie BouillonVarnita Melinda Fox Reason for Consultation:     Positive FOBT        HPI:   Melinda Fox is a 41 y.o. y/o female referred for consultation & management  by Dr. Sherie Fox, Melinda BernMelinda P, MD.  Patient has history of iron deficiency, and is being followed by oncology who has her on IV iron replacement.  She states since she was a child child she has been told that she has anemia.  She was on oral iron replacement on and off.  She has never had an EGD or colonoscopy.  Has never seen blood in her stool, no hematemesis, no melena.  Her iron deficiency has been attributed to menorrhagia, however, patient states she has not seen GYN in a few years.  Her recent stool testing showed 1 out of 3 stools positive for fecal occult blood.  No family history of colon cancer.  No dysphagia, no weight loss, no abdominal pain, no acid reflux.  No altered bowel habits.  Patient takes indomethacin as needed for joint pain.  She states she takes it about once every 2 weeks.  She also takes Excedrin and ibuprofen as needed.  She does not take these together.  She takes them as needed for headaches or joint pains.  On average he takes it about once every 2-3 weeks.  At times she is taking five 200 mg ibuprofen at one time.  Past Medical History:  Diagnosis Date  . Allergy   . Anemia   . Chronic migraine without aura without status migrainosus, not intractable   . Depression   . Elevated blood-pressure reading without diagnosis of hypertension   . Moderate concentric left ventricular hypertrophy 06/01/2017   Echocardiogram October 2018  . Morbid obesity with body mass index (BMI) of 60.0 to 69.9 in adult [facility](HCC) 07/01/2015  . Scoliosis   . Sleep  apnea, obstructive   . Thyroid disease     History reviewed. No pertinent surgical history.  Prior to Admission medications   Medication Sig Start Date End Date Taking? Authorizing Provider  albuterol (PROAIR HFA) 108 (90 Base) MCG/ACT inhaler Inhale 2 puffs into the lungs every 4 (four) hours as needed for wheezing or shortness of breath. Patient not taking: Reported on 07/18/2017 01/28/17   Kerman PasseyLada, Melinda P, MD  indomethacin (INDOCIN) 50 MG capsule Take 1 capsule (50 mg total) by mouth 3 (three) times daily as needed. Patient not taking: Reported on 07/18/2017 10/07/15   Edwena FeltySundaram, Ashany, MD  vitamin B-12 (CYANOCOBALAMIN) 1000 MCG tablet Take 1,000 mcg by mouth once a week.    [provider]    Family History  Adopted: Yes  Family history unknown: Yes     Social History   Tobacco Use  . Smoking status: Never Smoker  . Smokeless tobacco: Never Used  Substance Use Topics  . Alcohol use: No    Alcohol/week: 0.0 oz    Comment: once a year  . Drug use: No    Allergies as of 07/18/2017 - Review Complete 07/18/2017  Allergen Reaction Noted  . Sulfa antibiotics Other (See Comments) 07/01/2015    Review of Systems:    All systems reviewed and negative except where noted in HPI.   Physical  Exam:  BP 122/83   Pulse 81   Temp 98.5 F (36.9 C) (Oral)   Ht 5\' 8"  (1.727 m)   Wt (!) 188.1 kg (414 lb 9.6 oz)   LMP 06/29/2017   BMI 63.04 kg/m  Patient's last menstrual period was 06/29/2017. Psych:  Alert and cooperative. Normal mood and affect. General:   Alert,  Well-developed, well-nourished, pleasant and cooperative in NAD Head:  Normocephalic and atraumatic. Eyes:  Sclera clear, no icterus.   Conjunctiva pink. Ears:  Normal auditory acuity. Nose:  No deformity, discharge, or lesions. Mouth:  No deformity or lesions,oropharynx pink & moist. Neck:  Supple; no masses or thyromegaly. Lungs:  Respirations even and unlabored.  Clear throughout to auscultation.   No  wheezes, crackles, or rhonchi. No acute distress. Heart:  Regular rate and rhythm; no murmurs, clicks, rubs, or gallops. Abdomen:  Normal bowel sounds.  No bruits.  Soft, non-tender and non-distended without masses, hepatosplenomegaly or hernias noted.  No guarding or rebound tenderness.    Msk:  Symmetrical without gross deformities. Good, equal movement & strength bilaterally. Pulses:  Normal pulses noted. Extremities:  No clubbing or edema.  No cyanosis. Neurologic:  Alert and oriented x3;  grossly normal neurologically. Skin:  Intact without significant lesions or rashes. No jaundice. Lymph Nodes:  No significant cervical adenopathy. Psych:  Alert and cooperative. Normal mood and affect.   Labs: CBC    Component Value Date/Time   WBC 8.5 07/12/2017 1610   RBC 5.05 07/12/2017 1610   HGB 11.3 (L) 07/12/2017 1610   HGB 10.5 (L) 10/07/2015 1649   HCT 36.5 07/12/2017 1610   HCT 35.1 10/07/2015 1649   PLT 304 07/12/2017 1610   PLT 416 (H) 10/07/2015 1649   MCV 72.4 (L) 07/12/2017 1610   MCV 70 (L) 10/07/2015 1649   MCH 22.4 (L) 07/12/2017 1610   MCHC 31.0 (L) 07/12/2017 1610   RDW 20.3 (H) 07/12/2017 1610   RDW 16.5 (H) 10/07/2015 1649   LYMPHSABS 2.2 07/12/2017 1610   LYMPHSABS 3.2 (H) 10/07/2015 1649   MONOABS 0.6 07/12/2017 1610   EOSABS 0.0 07/12/2017 1610   EOSABS 0.0 10/07/2015 1649   BASOSABS 0.1 07/12/2017 1610   BASOSABS 0.1 10/07/2015 1649   CMP     Component Value Date/Time   NA 138 07/12/2017 1610   NA 141 10/07/2015 1649   K 3.6 07/12/2017 1610   CL 103 07/12/2017 1610   CO2 27 07/12/2017 1610   GLUCOSE 114 (H) 07/12/2017 1610   BUN 11 07/12/2017 1610   BUN 12 10/07/2015 1649   CREATININE 0.68 07/12/2017 1610   CREATININE 0.78 05/17/2017 1135   CALCIUM 9.6 07/12/2017 1610   PROT 7.2 07/12/2017 1610   PROT 7.2 10/07/2015 1649   ALBUMIN 3.9 07/12/2017 1610   ALBUMIN 4.3 10/07/2015 1649   AST 21 07/12/2017 1610   ALT 22 07/12/2017 1610   ALKPHOS 87  07/12/2017 1610   BILITOT <0.1 (L) 07/12/2017 1610   BILITOT <0.2 10/07/2015 1649   GFRNONAA >60 07/12/2017 1610   GFRNONAA 96 05/17/2017 1135   GFRAA >60 07/12/2017 1610   GFRAA 111 05/17/2017 1135   C-Reactive Protein  No results found for: CRP Iron/TIBC/Ferritin/ %Sat    Component Value Date/Time   IRON 20 (L) 07/12/2017 1610   IRON 18 (L) 10/07/2015 1649   TIBC 362 07/12/2017 1610   TIBC 421 10/07/2015 1649   FERRITIN 42 07/12/2017 1610   FERRITIN 11 (L) 10/07/2015 1649   IRONPCTSAT  6 (L) 07/12/2017 1610   IRONPCTSAT 4 (L) 05/17/2017 1135   Imaging Studies: No results found.  Assessment and Plan:   Shawnice Tilmon is a 41 y.o. y/o female has been referred for 1 out of 3 positive fecal occult blood testing, and history of iron deficiency anemia  Due to positive FOBT and iron deficiency, colonoscopy and EGD is indicated However, patient states she does not want these procedures at this time.  I discussed the risks of missing a malignancy if these procedures are not done, and she verbalized understanding.  She states she would like to think about it and talk with her family.  I have asked her to let us know of her decision as soon as possible.  I have explained the risks of cancer metastasis and death if there is an underlying malignancy and a procedure is not done to evaluate for the same.  However, I also discussed alternatives to endoscopy which would include CT colonoscopy, CT abdomen and stool DNA testing.  She does not want these ordered at this time either, and states she would think about the alternatives and would let me or her PCP know what she decides.  Given her positive FOBT and iron deficiency if she chooses not to have endoscopic evaluation, I would recommend stool DNA testing along with CT colonoscopy and CT abdomen pelvis.   I have also strongly encouraged her to follow-up with GYN regarding menorrhagia. she should continue follow-up with oncology and IV iron  infusions Should follow-up with her PCP as scheduled  I asked her to schedule a follow-up visit with [place], however, she does not want to schedule one at this time and she states she will call our office, and has our number.  She wants to call and schedule this once she is ready.  Would encourage PCP to continue to encourage follow-up for positive FOBT and further testing as recommended above  Dr Melinda Fox

## 2017-10-11 ENCOUNTER — Other Ambulatory Visit: Payer: BC Managed Care – PPO

## 2017-10-13 ENCOUNTER — Other Ambulatory Visit: Payer: BC Managed Care – PPO

## 2017-10-17 ENCOUNTER — Ambulatory Visit: Payer: BC Managed Care – PPO | Admitting: Oncology

## 2018-01-10 ENCOUNTER — Telehealth: Payer: Self-pay | Admitting: Family Medicine

## 2018-01-10 NOTE — Telephone Encounter (Signed)
Copied from CRM [id number]. Topic: Quick Communication - See Telephone Encounter >> Jan 10, 2018  9:34 AM [name] wrote: Melinda Fox / school is needing pt's immunization record. Pt would like the records consolidated. Please call pt when she can come by to pick them up.

## 2018-01-11 NOTE — Telephone Encounter (Signed)
Left detailed voicemail that I and my manager checked the new and old system and we do not see any shot records.

## 2018-01-20 ENCOUNTER — Telehealth: Payer: Self-pay

## 2018-01-20 NOTE — Telephone Encounter (Signed)
Copied from CRM [id number]. Topic: General - Other >> Jan 20, 2018 12:32 PM Gerrianne ScalePayne, Angela L wrote: Reason for CRM: Shawn [facility] [phone] calling to speak with Cyan Moultrie he leaves at 2 he wanted her to know  that he touched base with pt just a [address]

## 2018-04-12 ENCOUNTER — Ambulatory Visit: Payer: BC Managed Care – PPO | Admitting: Family Medicine

## 2018-04-12 ENCOUNTER — Encounter: Payer: Self-pay | Admitting: Family Medicine

## 2018-04-12 VITALS — BP 124/72 | HR 68 | Temp 98.6°F | Resp 18 | Ht 68.0 in | Wt >= 6400 oz

## 2018-04-12 DIAGNOSIS — L02214 Cutaneous abscess of groin: Secondary | ICD-10-CM

## 2018-04-12 MED ORDER — DOXYCYCLINE HYCLATE 100 MG PO TABS: 100.0000 mg | ORAL_TABLET | Freq: Two times a day (BID) | ORAL | 0 refills | Status: AC

## 2018-04-12 NOTE — Progress Notes (Signed)
Name: Melinda Fox   MRN: [id number]    DOB: [birth year]   Date:04/12/2018       Progress Note  Subjective  Chief Complaint  Chief Complaint  Patient presents with  . Mass    new found on left leg thigh area, painful and red    HPI  Pt presents with concern for "lump" that she found on the LEFT hip that she first noticed about 4 days ago. She endorses some tenderness and redness over the area.  She does have cats at home, but states no recent scratches; no recent tick bites, no recent travel.  Patient Active Problem List   Diagnosis Date Noted  . Elevated blood pressure reading 07/13/2017  . Moderate concentric left ventricular hypertrophy 06/01/2017  . Shortness of breath on exertion 05/17/2017  . Other fatigue 10/07/2015  . Iron deficiency anemia 07/01/2015  . Migraine without aura and with status migrainosus, not intractable 07/01/2015  . Major depression, recurrent, full remission (HCC) 07/01/2015  . Obstructive sleep apnea 07/01/2015  . Subclinical hypothyroidism 07/01/2015  . History of pneumonia 07/01/2015  . Allergic rhinitis 07/01/2015  . Lumbosacral disc disease 07/01/2015  . Sciatica neuralgia 07/01/2015  . Morbid obesity with body mass index (BMI) of 60.0 to 69.9 in adult [facility]) 07/01/2015    Social History   Tobacco Use  . Smoking status: Never Smoker  . Smokeless tobacco: Never Used  Substance Use Topics  . Alcohol use: No    Alcohol/week: 0.0 standard drinks    Comment: once a year     Current Outpatient Medications:  .  vitamin B-12 (CYANOCOBALAMIN) 1000 MCG tablet, Take 1,000 mcg by mouth once a week., Disp: , Rfl:  .  albuterol (PROAIR HFA) 108 (90 Base) MCG/ACT inhaler, Inhale 2 puffs into the lungs every 4 (four) hours as needed for wheezing or shortness of breath. (Patient not taking: Reported on 07/18/2017), Disp: 1 Inhaler, Rfl: 0 .  indomethacin (INDOCIN) 50 MG capsule, Take 1 capsule (50 mg total) by mouth 3 (three) times daily as  needed. (Patient not taking: Reported on 07/18/2017), Disp: 40 capsule, Rfl: 1  Allergies  Allergen Reactions  . Sulfa Antibiotics Other (See Comments)    Was told that she had it as a child    ROS  Ten systems reviewed and is negative except as mentioned in HPI  Objective  Vitals:   04/12/18 0755  BP: 124/72  Pulse: 68  Resp: 18  Temp: 98.6 F (37 C)  TempSrc: Oral  SpO2: 95%  Weight: (!) 428 lb 4.8 oz (194.3 kg)  Height: 5\' 8"  (1.727 m)   Body mass index is 65.12 kg/m.  Nursing Note and Vital Signs reviewed.  Physical Exam  Skin:     Skin of left hip exhibits small oblong raised, erythematous abscess with very minimal fluctuance, mild tenderness.    Constitutional: Patient appears well-developed and well-nourished. No distress.  HENT: Head: Normocephalic and atraumatic.  Eyes: Conjunctivae and EOM are normal. No scleral icterus.  Neck: Normal range of motion. Neck supple. No JVD present. .  Cardiovascular: Normal rate, regular rhythm and normal heart sounds.  No murmur heard. No BLE edema. Pulmonary/Chest: Effort normal and breath sounds normal. No respiratory distress. Musculoskeletal: Normal range of motion, no joint effusions. No gross deformities Neurological: he is alert and oriented to person, place, and time. No cranial nerve deficit. Coordination, balance, strength, speech and gait are normal.  Psychiatric: Patient has a normal mood and affect. behavior is  normal. Judgment and thought content normal.  No results found for this or any previous visit (from the past 72 hour(s)).  Assessment & Plan  1. Abscess of left groin - Pt voices concern for this area being an enlarged lymph node.  On assessment it appears to be just distal to the inguinal chain.  I recommend we trial antibiotics per orders, and if not improving in 5-7 days, return for re-evaluation and consideration for US. - doxycycline (VIBRA-TABS) 100 MG tablet; Take 1 tablet (100 mg total) by mouth  2 (two) times daily for 7 days.  Dispense: 14 tablet; Refill: 0 - Recommend NSAID OTC and moist heat application.  -Red flags and when to present for emergency care or RTC including fever >101.4F, chest pain, shortness of breath, new/worsening/un-resolving symptoms, reviewed with patient at time of visit. Follow up and care instructions discussed and provided in AVS.

## 2018-04-12 NOTE — Patient Instructions (Signed)
Skin Abscess A skin abscess is an infected area on or under your skin that contains pus and other material. An abscess can happen almost anywhere on your body. Some abscesses break open (rupture) on their own. Most continue to get worse unless they are treated. The infection can spread deeper into the body and into your blood, which can make you feel sick. Treatment usually involves draining the abscess. Follow these instructions at home: Abscess Care  If you have an abscess that has not drained, place a warm, clean, wet washcloth over the abscess several times a day. Do this as told by your doctor.  Follow instructions from your doctor about how to take care of your abscess. Make sure you: ? Cover the abscess with a bandage (dressing). ? Change your bandage or gauze as told by your doctor. ? Wash your hands with soap and water before you change the bandage or gauze. If you cannot use soap and water, use hand sanitizer.  Check your abscess every day for signs that the infection is getting worse. Check for: ? More redness, swelling, or pain. ? More fluid or blood. ? Warmth. ? More pus or a bad smell. Medicines   Take over-the-counter and prescription medicines only as told by your doctor.  If you were prescribed an antibiotic medicine, take it as told by your doctor. Do not stop taking the antibiotic even if you start to feel better. General instructions  To avoid spreading the infection: ? Do not share personal care items, towels, or hot tubs with others. ? Avoid making skin-to-skin contact with other people.  Keep all follow-up visits as told by your doctor. This is important. Contact a doctor if:  You have more redness, swelling, or pain around your abscess.  You have more fluid or blood coming from your abscess.  Your abscess feels warm when you touch it.  You have more pus or a bad smell coming from your abscess.  You have a fever.  Your muscles ache.  You have  chills.  You feel sick. Get help right away if:  You have very bad (severe) pain.  You see red streaks on your skin spreading away from the abscess. This information is not intended to replace advice given to you by your health care provider. Make sure you discuss any questions you have with your health care provider. Document Released: 01/19/2008 Document Revised: 03/28/2016 Document Reviewed: 06/11/2015 Elsevier Interactive Patient Education  2018 [employer]  

## 2018-04-20 ENCOUNTER — Telehealth: Payer: Self-pay | Admitting: Family Medicine

## 2018-04-20 NOTE — Telephone Encounter (Deleted)
Copied from CRM [id number]. Topic: Quick Communication - See Telephone Encounter >> Apr 20, 2018  8:22 AM Trula Slade wrote: CRM for notification. See Telephone encounter for: 04/20/18. Patient decided to make an appt instead

## 2018-04-20 NOTE — Telephone Encounter (Signed)
Patient decided to make an appt instead of leaving a note for the doctor.

## 2018-04-25 ENCOUNTER — Encounter: Payer: Self-pay | Admitting: Family Medicine

## 2018-04-25 ENCOUNTER — Encounter: Payer: Self-pay | Admitting: Emergency Medicine

## 2018-04-25 ENCOUNTER — Ambulatory Visit: Payer: BC Managed Care – PPO | Admitting: Family Medicine

## 2018-04-25 VITALS — BP 124/80 | HR 81 | Temp 97.8°F | Resp 18 | Ht 68.0 in | Wt >= 6400 oz

## 2018-04-25 DIAGNOSIS — J0100 Acute maxillary sinusitis, unspecified: Secondary | ICD-10-CM | POA: Diagnosis not present

## 2018-04-25 DIAGNOSIS — J209 Acute bronchitis, unspecified: Secondary | ICD-10-CM | POA: Diagnosis not present

## 2018-04-25 DIAGNOSIS — L02214 Cutaneous abscess of groin: Secondary | ICD-10-CM | POA: Diagnosis not present

## 2018-04-25 MED ORDER — AMOXICILLIN-POT CLAVULANATE 875-125 MG PO TABS: 1.0000 | ORAL_TABLET | Freq: Two times a day (BID) | ORAL | 0 refills | Status: AC

## 2018-04-25 MED ORDER — BUDESONIDE-FORMOTEROL FUMARATE 80-4.5 MCG/ACT IN AERO: 2.0000 | INHALATION_SPRAY | Freq: Two times a day (BID) | RESPIRATORY_TRACT | 0 refills | Status: DC

## 2018-04-25 NOTE — Progress Notes (Signed)
Name: Melinda Fox   MRN: [id number]    DOB: 08-Dec-1975   Date:04/25/2018       Progress Note  Subjective  Chief Complaint  Chief Complaint  Patient presents with  . Follow-up  . Sinusitis  . Otalgia    HPI  Left Groin Abscess: Following up, she completed antibiotic (Doxy - give on 04/12/2018, completed 04/19/2018), and she notes the pain and redness have improved but the area is still discolored and there is a central spot that is open slightly - no drainage.  Respiratory Infection: Fever off and on for about a week; woke up 4 days ago with sore throat, the next day she developed nasal congestion, having cough and some chest tightness - she notes a history of sinus infection and bronchitis around this time most years.  She is using albuterol inhaler without relief.  Endorses some nausea, 1 episode of vomiting, and diarrhea. Denies body aches, chest pain, shortness of breath, dysuria, urinary frequency.  Patient Active Problem List   Diagnosis Date Noted  . Elevated blood pressure reading 07/13/2017  . Moderate concentric left ventricular hypertrophy 06/01/2017  . Shortness of breath on exertion 05/17/2017  . Other fatigue 10/07/2015  . Iron deficiency anemia 07/01/2015  . Migraine without aura and with status migrainosus, not intractable 07/01/2015  . Major depression, recurrent, full remission (HCC) 07/01/2015  . Obstructive sleep apnea 07/01/2015  . Subclinical hypothyroidism 07/01/2015  . History of pneumonia 07/01/2015  . Allergic rhinitis 07/01/2015  . Lumbosacral disc disease 07/01/2015  . Sciatica neuralgia 07/01/2015  . Morbid obesity with body mass index (BMI) of 60.0 to 69.9 in adult [facility]) 07/01/2015    Social History   Tobacco Use  . Smoking status: Never Smoker  . Smokeless tobacco: Never Used  Substance Use Topics  . Alcohol use: No    Alcohol/week: 0.0 standard drinks    Comment: once a year     Current Outpatient Medications:  .  albuterol  (PROAIR HFA) 108 (90 Base) MCG/ACT inhaler, Inhale 2 puffs into the lungs every 4 (four) hours as needed for wheezing or shortness of breath., Disp: 1 Inhaler, Rfl: 0 .  indomethacin (INDOCIN) 50 MG capsule, Take 1 capsule (50 mg total) by mouth 3 (three) times daily as needed., Disp: 40 capsule, Rfl: 1 .  vitamin B-12 (CYANOCOBALAMIN) 1000 MCG tablet, Take 1,000 mcg by mouth once a week., Disp: , Rfl:   Allergies  Allergen Reactions  . Sulfa Antibiotics Other (See Comments)    Was told that she had it as a child    ROS  Ten systems reviewed and is negative except as mentioned in HPI  Objective  Vitals:   04/25/18 0846  BP: 124/80  Pulse: 81  Resp: 18  Temp: 97.8 F (36.6 C)  TempSrc: Oral  SpO2: 94%  Weight: (!) 422 lb 14.4 oz (191.8 kg)  Height: 5\' 8"  (1.727 m)    Body mass index is 64.3 kg/m.  Nursing Note and Vital Signs reviewed.  Physical Exam  Constitutional: Patient appears well-developed and well-nourished. No distress.  HENT: Head: Normocephalic and atraumatic. Ears: RIGHT TM buling; no erythema; +Maxillary tenderness bilaterally, no frontal sinus tenderness. Mouth/Throat: Oropharynx is clear and moist without erythema. No oropharyngeal exudate.  Eyes: Conjunctivae and EOM are normal. Pupils are equal, round, and reactive to light. No scleral icterus.  Neck: Normal range of motion. Neck +submandibular lymphadenopathy bilaterally. No JVD present. No thyromegaly present.  Cardiovascular: Normal rate, regular rhythm and normal  heart sounds.  No murmur heard. No BLE edema. Pulmonary/Chest: Effort normal and breath sounds normal. No respiratory distress. Significant dry, raspy cough on examination Musculoskeletal: Normal range of motion, no joint effusions. No gross deformities Neurological: he is alert and oriented to person, place, and time. No cranial nerve deficit. Coordination, balance, strength, speech and gait are normal.  Skin: Skin of left hip exhibits small  oblong raised, firm, hyperpigmented area without tenderness, small, pin-head sized open area that is pink without exudate, no excoriation or erythema, no red streaking. Psychiatric: Patient has a normal mood and affect. behavior is normal. Judgment and thought content normal.  No results found for this or any previous visit (from the past 72 hour(s)).  Assessment & Plan  1. Acute non-recurrent maxillary sinusitis - amoxicillin-clavulanate (AUGMENTIN) 875-125 MG tablet; Take 1 tablet by mouth 2 (two) times daily for 10 days.  Dispense: 20 tablet; Refill: 0  2. Acute bronchitis, unspecified organism - Discussed option for prednisone, she declines today and we will trial symbicort.  Does not want to take tessalon but will call if needed. - budesonide-formoterol (SYMBICORT) 80-4.5 MCG/ACT inhaler; Inhale 2 puffs into the lungs 2 (two) times daily.  Dispense: 1 Inhaler; Refill: 0  3. Abscess of left groin - Advised to monitor - appears to be improving, however her panus does appear to come into contact with this area, and this may be causing ongoing hyperpigmentation/irritation.  Advised to monitor, try to reduce friction in the area, and we will consider dermatology referral if not improving.  -Red flags and when to present for emergency care or RTC including fever >101.28F, chest pain, shortness of breath, new/worsening/un-resolving symptoms, ocular swelling/erythema reviewed with patient at time of visit. Follow up and care instructions discussed and provided in AVS.

## 2018-06-16 ENCOUNTER — Encounter: Payer: Self-pay | Admitting: Nurse Practitioner

## 2018-06-16 ENCOUNTER — Ambulatory Visit: Payer: BC Managed Care – PPO | Admitting: Nurse Practitioner

## 2018-06-16 VITALS — BP 100/50 | HR 90 | Temp 98.3°F | Resp 16 | Ht 68.0 in | Wt >= 6400 oz

## 2018-06-16 DIAGNOSIS — L301 Dyshidrosis [pompholyx]: Secondary | ICD-10-CM | POA: Diagnosis not present

## 2018-06-16 MED ORDER — TRIAMCINOLONE ACETONIDE 0.5 % EX OINT: 1.0000 "application " | TOPICAL_OINTMENT | Freq: Two times a day (BID) | CUTANEOUS | 0 refills | Status: DC

## 2018-06-16 NOTE — Progress Notes (Signed)
Name: Melinda Fox   MRN: [id number]    DOB: 1975/12/05   Date:06/16/2018       Progress Note  Subjective  Chief Complaint  Chief Complaint  Patient presents with  . Pruritis    itching on both hands x 3 days with hives.    HPI  Patient endorses red rash with small white bumps on bilateral hands and between fingers, started on Wednesday and has largely resolved at this time. Has had three episodes of this in the past that self resolved; has used hydrocortisone cream on bilateral hands with moderate relief of symptoms.   Patient Active Problem List   Diagnosis Date Noted  . Elevated blood pressure reading 07/13/2017  . Moderate concentric left ventricular hypertrophy 06/01/2017  . Shortness of breath on exertion 05/17/2017  . Other fatigue 10/07/2015  . Iron deficiency anemia 07/01/2015  . Migraine without aura and with status migrainosus, not intractable 07/01/2015  . Major depression, recurrent, full remission (HCC) 07/01/2015  . Obstructive sleep apnea 07/01/2015  . Subclinical hypothyroidism 07/01/2015  . History of pneumonia 07/01/2015  . Allergic rhinitis 07/01/2015  . Lumbosacral disc disease 07/01/2015  . Sciatica neuralgia 07/01/2015  . Morbid obesity with body mass index (BMI) of 60.0 to 69.9 in adult [facility]) 07/01/2015    Past Medical History:  Diagnosis Date  . Allergy   . Anemia   . Chronic migraine without aura without status migrainosus, not intractable   . Depression   . Elevated blood-pressure reading without diagnosis of hypertension   . Moderate concentric left ventricular hypertrophy 06/01/2017   Echocardiogram October 2018  . Morbid obesity with body mass index (BMI) of 60.0 to 69.9 in adult [facility]) 07/01/2015  . Scoliosis   . Sleep apnea, obstructive   . Thyroid disease     History reviewed. No pertinent surgical history.  Social History   Tobacco Use  . Smoking status: Never Smoker  . Smokeless tobacco: Never Used  Substance Use Topics    . Alcohol use: No    Alcohol/week: 0.0 standard drinks    Comment: once a year     Current Outpatient Medications:  .  indomethacin (INDOCIN) 50 MG capsule, Take 1 capsule (50 mg total) by mouth 3 (three) times daily as needed., Disp: 40 capsule, Rfl: 1 .  vitamin B-12 (CYANOCOBALAMIN) 1000 MCG tablet, Take 1,000 mcg by mouth once a week., Disp: , Rfl:  .  albuterol (PROAIR HFA) 108 (90 Base) MCG/ACT inhaler, Inhale 2 puffs into the lungs every 4 (four) hours as needed for wheezing or shortness of breath., Disp: 1 Inhaler, Rfl: 0 .  budesonide-formoterol (SYMBICORT) 80-4.5 MCG/ACT inhaler, Inhale 2 puffs into the lungs 2 (two) times daily., Disp: 1 Inhaler, Rfl: 0  Allergies  Allergen Reactions  . Sulfa Antibiotics Other (See Comments)    Was told that she had it as a child    Review of Systems  Constitutional: Negative for chills, fever and malaise/fatigue.  Musculoskeletal: Negative for myalgias.  Skin: Positive for itching and rash.  Neurological: Negative for headaches.     No other specific complaints in a complete review of systems (except as listed in HPI above).  Objective  Vitals:   06/16/18 1039  BP: (!) 100/50  Pulse: (!) 105  Resp: 16  Temp: 98.3 F (36.8 C)  TempSrc: Oral  SpO2: 98%  Weight: (!) 433 lb 6.4 oz (196.6 kg)  Height: 5\' 8"  (1.727 m)     Body mass index is 65.9 kg/m.  Nursing Note and Vital Signs reviewed.  Physical Exam  Constitutional: She is oriented to person, place, and time. She appears well-developed and well-nourished.  HENT:  Head: Normocephalic and atraumatic.  Cardiovascular: Normal rate.  Pulmonary/Chest: Effort normal.  Neurological: She is alert and oriented to person, place, and time.  Skin:     Psychiatric: She has a normal mood and affect. Her behavior is normal. Judgment and thought content normal.     No results found for this or any previous visit (from the past 48 hour(s)).  Assessment & Plan  1.  Dyshidrotic eczema Discussed OTC management  - triamcinolone ointment (KENALOG) 0.5 %; Apply 1 application topically 2 (two) times daily.  Dispense: 30 g; Refill: 0

## 2018-06-16 NOTE — Patient Instructions (Signed)
Dyshidrotic Eczema Dyshidrotic eczema (pompholyx) is a type of eczema that causes very itchy (pruritic), fluid-filled blisters (vesicles) to form on the hands and feet. It can affect people of any age, but is more common before the age of 40. There is no cure, but treatment and certain lifestyle changes can help relieve symptoms. What are the causes? The cause of this condition is not known. What increases the risk? You are more likely to develop this condition if:  You wash your hands frequently.  You have a personal history or family history of eczema, allergies, asthma, or hay fever.  You are allergic to metals such as nickel or cobalt.  You work with cement.  You smoke.  What are the signs or symptoms? Symptoms of this condition may affect the hands, feet, or both. Symptoms may come and go (recur), and may include:  Severe itching, which may happen before blisters appear.  Blisters. These may form suddenly. ? In the early stages, blisters may form near the fingertips. ? In severe cases, blisters may grow to large blister masses (bullae). ? Blisters resolve in 2-3 weeks without bursting. This is followed by a dry phase in which itching eases.  Pain and swelling.  Cracks or long, narrow openings (fissures) in the skin.  Severe dryness.  Ridges on the nails.  How is this diagnosed? This condition may be diagnosed based on:  A physical exam.  Your symptoms.  Your medical history.  Skin scrapings to rule out a fungal infection.  Testing a swab of fluid for bacteria (culture).  Removing and checking a small piece of skin (biopsy) in order to test for infection or to rule out other conditions.  Skin patch tests. These tests involve taking patches that contain possible allergens and placing them on your back. Your health care provider will wait a few days and then check to see if an allergic reaction occurred. These tests may be done if your health care provider suspects  allergic reactions, or to rule out other types of eczema.  You may be referred to a health care provider who specializes in the skin (dermatologist) to help diagnose and treat this condition. How is this treated? There is no cure for this condition, but treatment can help relieve symptoms. Depending on how many blisters you have and how severe they are, your health care provider may suggest:  Avoiding allergens, irritants, or triggers that worsen symptoms. This may involve lifestyle changes such as: ? Using different lotions or soaps. ? Avoiding hot weather or places that will cause you to sweat a lot. ? Managing stress with coping techniques such as relaxation and exercise, and asking for help when you need it. ? Diet changes as recommended by your health care provider.  Using a clean, damp towel (cool compress) to relieve symptoms.  Soaking in a bath that contains a type of salt that relieves irritation (aluminum acetate soaks).  Medicine taken by mouth to reduce itching (oral antihistamines).  Medicine applied to the skin to reduce swelling and irritation (topical corticosteroids).  Medicine that reduces the activity of the body's disease-fighting system (immunosuppressants) to treat inflammation. This may be given in severe cases.  Antibiotic medicines to treat bacterial infection.  Light therapy (phototherapy). This involves shining ultraviolet (UV) light on affected skin in order to reduce itchiness and inflammation.  Follow these instructions at home: Bathing and skin care  Wash skin gently. After bathing or washing your hands, pat your skin dry. Avoid rubbing your   skin.  Remove all jewelry before bathing. If the skin under the jewelry stays wet, blisters may form or get worse.  Apply cool compresses as told by your health care provider: ? Soak a clean towel in cool water. ? Wring out excess water until towel is damp. ? Place the towel over affected skin. Leave the towel on  for 20 minutes at a time, 2-3 times a day.  Use mild soaps, cleansers, and lotions that do not contain dyes, perfumes, or other irritants.  Keep your skin hydrated. To do this: ? Avoid very hot water. Take lukewarm baths or showers. ? Apply moisturizer within three minutes of bathing. This locks in moisture. Medicines  Take and apply over-the-counter and prescription medicines only as told by your health care provider.  If you were prescribed antibiotic medicine, take or apply it as told by your health care provider. Do not stop using the antibiotic even if you start to feel better. General instructions  Identify and avoid triggers and allergens.  Keep fingernails short to avoid breaking open the skin while scratching.  Use waterproof gloves to protect your hands when doing work that keeps your hands wet for a long time.  Wear socks to keep your feet dry.  Do not use any products that contain nicotine or tobacco, such as cigarettes and e-cigarettes. If you need help quitting, ask your health care provider.  Keep all follow-up visits as told by your health care provider. This is important. Contact a health care provider if:  You have symptoms that do not go away.  You have signs of infection, such as: ? Crusting, pus, or a bad smell. ? More redness, swelling, or pain. ? Increased warmth in the affected area. Summary  Dyshidrotic eczema (pompholyx) is a type of eczema that causes very itchy (pruritic), fluid-filled blisters (vesicles) to form on the hands and feet.  The cause of this condition is not known.  There is no cure for this condition, but treatment can help relieve symptoms. Treatment depends on how many blisters you have and how severe they are.  Use mild soaps, cleansers, and lotions that do not contain dyes, perfumes, or other irritants. Keep your skin hydrated. This information is not intended to replace advice given to you by your health care provider. Make sure  you discuss any questions you have with your health care provider. Document Released: 12/16/2016 Document Revised: 12/16/2016 Document Reviewed: 12/16/2016 Elsevier Interactive Patient Education  2018 [employer]  

## 2018-08-21 ENCOUNTER — Encounter: Payer: Self-pay | Admitting: Family Medicine

## 2018-08-21 ENCOUNTER — Ambulatory Visit: Payer: Self-pay | Admitting: Family Medicine

## 2018-08-21 ENCOUNTER — Ambulatory Visit: Payer: BC Managed Care – PPO | Admitting: Family Medicine

## 2018-08-21 VITALS — BP 120/76 | HR 98 | Temp 98.0°F | Ht 68.0 in | Wt >= 6400 oz

## 2018-08-21 DIAGNOSIS — I499 Cardiac arrhythmia, unspecified: Secondary | ICD-10-CM

## 2018-08-21 DIAGNOSIS — R5383 Other fatigue: Secondary | ICD-10-CM

## 2018-08-21 DIAGNOSIS — R002 Palpitations: Secondary | ICD-10-CM

## 2018-08-21 DIAGNOSIS — E038 Other specified hypothyroidism: Secondary | ICD-10-CM

## 2018-08-21 DIAGNOSIS — L301 Dyshidrosis [pompholyx]: Secondary | ICD-10-CM

## 2018-08-21 DIAGNOSIS — D509 Iron deficiency anemia, unspecified: Secondary | ICD-10-CM | POA: Diagnosis not present

## 2018-08-21 DIAGNOSIS — E039 Hypothyroidism, unspecified: Secondary | ICD-10-CM

## 2018-08-21 DIAGNOSIS — G4733 Obstructive sleep apnea (adult) (pediatric): Secondary | ICD-10-CM

## 2018-08-21 DIAGNOSIS — I517 Cardiomegaly: Secondary | ICD-10-CM

## 2018-08-21 MED ORDER — TRIAMCINOLONE ACETONIDE 0.5 % EX OINT: 1.0000 "application " | TOPICAL_OINTMENT | Freq: Two times a day (BID) | CUTANEOUS | 1 refills | Status: DC

## 2018-08-21 NOTE — Progress Notes (Signed)
BP 120/76   Pulse 98   Temp 98 F (36.7 C) (Oral)   Ht 5\' 8"  (1.727 m)   Wt (!) 435 lb 8 oz (197.5 kg)   LMP 07/29/2018   SpO2 97%   BMI 66.22 kg/m    Subjective:    Patient ID: Melinda Fox, female    DOB: 05/17/1976, 43 y.o.   MRN: [id number]  HPI: Melinda Fox is a 43 y.o. female  Chief Complaint  Patient presents with  . Irregular Heart Beat    onset today, woke up at 4am last til 12. headache, diarrhea last 2 days. not currently experiencing  . 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) 128/78 BP and heart rate was 55 at school, and they sent her home At the moment, she is fine It was beating fast until about noon; fine since then More irregular than anything No chest pain Small headache now; little bit of dizziness this morning Has had an EKG before and thinks this has happened before No hx of low K+ or Mg2+ Little bit of swelling in the legs Not sure of anyone in the fam with a fib, adopted She has sleep apnea and they tried her on the sleep apnea machine and just couldn't wear the mask; tried different masks and just couldn't do it; that was at least 7 years ago  She wants to be tested for Hashimoto's disease; struggle to lose weight; weight has gone up; GYN changed towns; I asked about PCOS, and she is not sexually active, not concerned about that part of her anatomy  She has irritated skin; was seen by the NP for rash; little white bumps betweeen the fingers and palms and on the feet  Vitamin B12 was a little low at 357 last year; was taking supplement Not sure about vit D status; takes in the winter time  Hx of iron deficiency anemia; was seen by hematologist  She is open to bariatric surgery; does not overeat; weight is not maintaining, just keeps going up and going up; actually did the on-line survey  and 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; she is open to trying different things; had a problem with her weight since childhood  Drinks a lot of tea, not a ton of sugar in it; 2-3 a day; 64 ounces or more daily; coffee with creamer  [facility] 08/21/2018 04/25/2018 04/12/2018 07/13/2017 05/17/2017  Decreased Interest 0 2 1 0 0  Down, Depressed, Hopeless 0 2 1 0 1  PHQ - 2 Score 0 4 2 0 1  Altered sleeping 0 1 1 - -  Tired, decreased energy 0 1 1 - -  Change in appetite 0 3 1 - -  Feeling bad or failure about yourself  0 0 1 - -  Trouble concentrating 0 2 1 - -  Moving slowly or fidgety/restless 0 1 1 - -  Suicidal thoughts 0 0 0 - -  PHQ-9 Score 0 12 8 - -  Difficult doing work/chores Not difficult at all Somewhat difficult Somewhat difficult - -   Fall Risk  08/21/2018 06/16/2018 04/25/2018 04/12/2018 07/13/2017  Falls in the past year? 0 0 No No No  Number falls in past yr: 0 - - - -  Injury with Fall? 0 - - - -  Relevant past medical, surgical, family and social history reviewed Past Medical History:  Diagnosis Date  . Allergy   . Anemia   . Chronic migraine without aura without status migrainosus, not intractable   . Depression   . Elevated blood-pressure reading without diagnosis of hypertension   . Moderate concentric left ventricular hypertrophy 06/01/2017   Echocardiogram October 2018  . Morbid obesity with body mass index (BMI) of 60.0 to 69.9 in adult [facility]) 07/01/2015  . Scoliosis   . Sleep apnea, obstructive   . Thyroid disease    History reviewed. No pertinent surgical history. Family History  Adopted: Yes  Family history unknown: Yes   Social History   Tobacco Use  . Smoking status: Never Smoker  . Smokeless tobacco: Never Used  Substance Use Topics  . Alcohol use: No    Alcohol/week: 0.0 standard drinks    Comment: once a year  . Drug use: No     Office Visit from 08/21/2018 in [facility]  AUDIT-C Score  0      Interim  medical history since last visit reviewed. Allergies and medications reviewed  Review of Systems Per HPI unless specifically indicated above     Objective:    BP 120/76   Pulse 98   Temp 98 F (36.7 C) (Oral)   Ht 5\' 8"  (1.727 m)   Wt (!) 435 lb 8 oz (197.5 kg)   LMP 07/29/2018   SpO2 97%   BMI 66.22 kg/m   Wt Readings from Last 3 Encounters:  08/21/18 (!) 435 lb 8 oz (197.5 kg)  06/16/18 (!) 433 lb 6.4 oz (196.6 kg)  04/25/18 (!) 422 lb 14.4 oz (191.8 kg)    Physical Exam Constitutional:      General: She is not in acute distress.    Appearance: She is well-developed. She is obese. She is not diaphoretic.     Comments: Extremely morbidly obese  HENT:     Head: Normocephalic and atraumatic.  Eyes:     General: No scleral icterus. Neck:     Thyroid: No thyromegaly.  Cardiovascular:     Rate and Rhythm: Normal rate and regular rhythm.     Heart sounds: Normal heart sounds. No murmur.  Pulmonary:     Effort: Pulmonary effort is normal. No respiratory distress.     Breath sounds: Normal breath sounds. No wheezing.  Abdominal:     General: Bowel sounds are normal. There is no distension.     Palpations: Abdomen is soft.  Musculoskeletal:     Right lower leg: Edema (nonpitting, appears c/w lipedema ) present.     Left lower leg: Edema (nonpitting, appears c/w lipedema) present.  Skin:    General: Skin is warm and dry.     Coloration: Skin is not pale.  Neurological:     Mental Status: She is alert.  Psychiatric:        Behavior: Behavior normal.        Thought Content: Thought content normal.        Judgment: Judgment normal.       Assessment & Plan:   Problem List Items Addressed This Visit      Cardiovascular and Mediastinum   Moderate concentric left ventricular hypertrophy    Refer to cardiologist; this is likely related to her grossly morbid obesity        Respiratory   Obstructive sleep apnea    Explained that untreated sleep apnea can be fatal;  can  lead to heart failure, sudden death; so important to lose weight and get this treated; referral to pulmonologist for this        Endocrine   Subclinical hypothyroidism   Relevant Orders   TSH (Completed)   Thyroid peroxidase antibody (Completed)   Thyroglobulin Antibodies (Refl) (Completed)   T4, free (Completed)     Other   Other fatigue   Relevant Orders   VITAMIN D 25 Hydroxy (Vit-D Deficiency, Fractures) (Completed)   Iron deficiency anemia   Relevant Orders   CBC with Differential/Platelet (Completed)    Other Visit Diagnoses    Irregular heart beat    -  Primary   sleep apnea may have triggered arrhythmia?; NSR now; no chest pain; currently sx-free; refer to cards, holter monitor, labs   Relevant Orders   EKG 12-Lead (Completed)   TSH (Completed)   Magnesium (Completed)   T4, free (Completed)   Holter monitor - 48 hour   Ambulatory referral to Cardiology   Dyshidrotic eczema       Relevant Medications   triamcinolone ointment (KENALOG) 0.5 %   Morbid obesity (HCC)       Relevant Orders   Amb Referral to Bariatric Surgery   Obstructive sleep apnea syndrome       Relevant Orders   Ambulatory referral to Pulmonology   Palpitations       labs, EKG done today, see copy; refer to cardiology; get holter monitor   Relevant Orders   COMPLETE METABOLIC PANEL WITH GFR (Completed)   TSH (Completed)   Magnesium (Completed)   T4, free (Completed)   Holter monitor - 48 hour   Ambulatory referral to Cardiology       Follow up plan: Return in about 4 weeks (around 09/18/2018) for CPE with pap.  An after-visit summary was printed and given to the patient at check-out.  Please see the patient instructions which may contain other information and recommendations beyond what is mentioned above in the assessment and plan.  Meds ordered this encounter  Medications  . triamcinolone ointment (KENALOG) 0.5 %    Sig: Apply 1 application topically 2 (two) times daily. Just when  needed; too strong for face, under the arms, groin    Dispense:  30 g    Refill:  1    Orders Placed This Encounter  Procedures  . CBC with Differential/Platelet  . COMPLETE METABOLIC PANEL WITH GFR  . TSH  . Magnesium  . Thyroid peroxidase antibody  . Thyroglobulin Antibodies (Refl)  . VITAMIN D 25 Hydroxy (Vit-D Deficiency, Fractures)  . T4, free  . Ambulatory referral to Pulmonology  . Ambulatory referral to Cardiology  . Amb Referral to Bariatric Surgery  . Holter monitor - 48 hour  . EKG 12-Lead

## 2018-08-21 NOTE — Telephone Encounter (Signed)
Irregular heartbeat and HR since 4am.  Patient is at work and has been accessed by a "school nurse" and states her HR is 55 with a B/P of 127/72  I explained to patient that those numbers are ok.  I went over normal/abnormal HR and explained her is a little low and I am not sure why she is experiencing the pounding in her chest.  Patient states the school nurse is making her go and be evaluated.  Appointment made for today per patient request. Answer Assessment - Initial Assessment Questions 1. DESCRIPTION: "Please describe your heart rate or heart beat that you are having" (e.g., fast/slow, regular/irregular, skipped or extra beats, "palpitations")     Pounding in chest 2. ONSET: "When did it start?" (Minutes, hours or days)   4 am 3. DURATION: "How long does it last" (e.g., seconds, minutes, hours)    Still going on since 4 am 4. PATTERN "Does it come and go, or has it been constant since it started?"  "Does it get worse with exertion?"   "Are you feeling it now?"   constanst 5. TAP: "Using your hand, can you tap out what you are feeling on a chair or table in front of you, so that I can hear?" (Note: not all patients can do this)       States a school nurse did it and it was 55 bp 127/72 7. RECURRENT SYMPTOM: "Have you ever had this before?" If so, ask: "When was the last time?" and "What happened that time?"     Not in a long time 8. CAUSE: "What do you think is causing the palpitations?"    unsure 9. CARDIAC HISTORY: "Do you have any history of heart disease?" (e.g., heart attack, angina, bypass surgery, angioplasty, arrhythmia)   no, but has had irregular heart beat 10. OTHER SYMPTOMS: "Do you have any other symptoms?" (e.g., dizziness, chest pain, sweating, difficulty breathing)       No 11. PREGNANCY: "Is there any chance you are pregnant?" "When was your last menstrual period?"       No, lmp: 07/29/2018  Protocols used: HEART RATE AND HEARTBEAT QUESTIONS-A-AH

## 2018-08-21 NOTE — Patient Instructions (Addendum)
We'll get labs today We'll have you see the cardiologist I've placed a referral to the bariatric clinic We'll get a holter monitor We'll have you see the pulmonologist for sleep apnea If you have not heard anything from my staff in a week about any orders/referrals/studies from today, please contact us here to follow-up [phone]  Check out the information at [email or web address].org entitled "Nutrition for Weight Loss: What You Need to Know about Fad Diets" Try to lose between 1-2 pounds per week by taking in fewer calories and burning off more calories You can succeed by limiting portions, limiting foods dense in calories and fat, becoming more active, and drinking 8 glasses of water a day (64 ounces) Don't skip meals, especially breakfast, as skipping meals may alter your metabolism Do not use over-the-counter weight loss pills or gimmicks that claim rapid weight loss A healthy BMI (or body mass index) is between 18.5 and 24.9 You can calculate your ideal BMI at the NIH website JobEconomics.hu  Taper your caffeine as tolerated over the coming weeks Please call 911 or go to the ER if you develop chest pain, dizziness, shortness of breath, etc.  Preventing Unhealthy Weight Gain, Adult Staying at a healthy weight is important to your overall health. When fat builds up in your body, you may become overweight or obese. Being overweight or obese increases your risk of developing certain health problems, such as heart disease, diabetes, sleeping problems, joint problems, and some types of cancer. Unhealthy weight gain is often the result of making unhealthy food choices or not getting enough exercise. You can make changes to your lifestyle to prevent obesity and stay as healthy as possible. What nutrition changes can be made?   Eat only as much as your body needs. To do this: ? Pay attention to signs that you are hungry or full. Stop eating as  soon as you feel full. ? If you feel hungry, try drinking water first before eating. Drink enough water so your urine is clear or pale yellow. ? Eat smaller portions. Pay attention to portion sizes when eating out. ? Look at serving sizes on food labels. Most foods contain more than one serving per container. ? Eat the recommended number of calories for your gender and activity level. For most active people, a daily total of 2,000 calories is appropriate. If you are trying to lose weight or are not very active, you may need to eat fewer calories. Talk with your health care provider or a diet and nutrition specialist (dietitian) about how many calories you need each day.  Choose healthy foods, such as: ? Fruits and vegetables. At each meal, try to fill at least half of your plate with fruits and vegetables. ? Whole grains, such as whole-wheat bread, brown rice, and quinoa. ? Lean meats, such as chicken or fish. ? Other healthy proteins, such as beans, eggs, or tofu. ? Healthy fats, such as nuts, seeds, fatty fish, and olive oil. ? Low-fat or fat-free dairy products.  Check food labels, and avoid food and drinks that: ? Are high in calories. ? Have added sugar. ? Are high in sodium. ? Have saturated fats or trans fats.  Cook foods in healthier ways, such as by baking, broiling, or grilling.  Make a meal plan for the week, and shop with a grocery list to help you stay on track with your purchases. Try to avoid going to the grocery store when you are hungry.  When grocery shopping, try to  shop around the outside of the store first, where the fresh foods are. Doing this helps you to avoid prepackaged foods, which can be high in sugar, salt (sodium), and fat. What lifestyle changes can be made?   Exercise for 30 or more minutes on 5 or more days each week. Exercising may include brisk walking, yard work, biking, running, swimming, and team sports like basketball and soccer. Ask your health care  provider which exercises are safe for you.  Do muscle-strengthening activities, such as lifting weights or using resistance bands, on 2 or more days a week.  Do not use any products that contain nicotine or tobacco, such as cigarettes and e-cigarettes. If you need help quitting, ask your health care provider.  Limit alcohol intake to no more than 1 drink a day for nonpregnant women and 2 drinks a day for men. One drink equals 12 oz of beer, 5 oz of wine, or 1 oz of hard liquor.  Try to get 7-9 hours of sleep each night. What other changes can be made?  Keep a food and activity journal to keep track of: ? What you ate and how many calories you had. Remember to count the calories in sauces, dressings, and side dishes. ? Whether you were active, and what exercises you did. ? Your calorie, weight, and activity goals.  Check your weight regularly. Track any changes. If you notice you have gained weight, make changes to your diet or activity routine.  Avoid taking weight-loss medicines or supplements. Talk to your health care provider before starting any new medicine or supplement.  Talk to your health care provider before trying any new diet or exercise plan. Why are these changes important? Eating healthy, staying active, and having healthy habits can help you to prevent obesity. Those changes also:  Help you manage stress and emotions.  Help you connect with friends and family.  Improve your self-esteem.  Improve your sleep.  Prevent long-term health problems. What can happen if changes are not made? Being obese or overweight can cause you to develop joint or bone problems, which can make it hard for you to stay active or do activities you enjoy. Being obese or overweight also puts stress on your heart and lungs and can lead to health problems like diabetes, heart disease, and some cancers. Where to find more information Talk with your health care provider or a dietitian about  healthy eating and healthy lifestyle choices. You may also find information from:  U.[place]. Department of Agriculture, MyPlate: https://ball-collins.biz/  American Heart Association: www.heart.org  Centers for Disease Control and Prevention: FootballExhibition.com.br Summary  Staying at a healthy weight is important to your overall health. It helps you to prevent certain diseases and health problems, such as heart disease, diabetes, joint problems, sleep disorders, and some types of cancer.  Being obese or overweight can cause you to develop joint or bone problems, which can make it hard for you to stay active or do activities you enjoy.  You can prevent unhealthy weight gain by eating a healthy diet, exercising regularly, not smoking, limiting alcohol, and getting enough sleep.  Talk with your health care provider or a dietitian for guidance about healthy eating and healthy lifestyle choices. This information is not intended to replace advice given to you by your health care provider. Make sure you discuss any questions you have with your health care provider. Document Released: 08/03/2016 Document Revised: 05/13/2017 Document Reviewed: 09/08/2016 Elsevier Interactive Patient Education  2019 [employer].

## 2018-08-22 ENCOUNTER — Other Ambulatory Visit: Payer: Self-pay | Admitting: Family Medicine

## 2018-08-22 LAB — CBC WITH DIFFERENTIAL/PLATELET
Absolute Monocytes: 762 cells/uL (ref 200–950)
Basophils Absolute: 76 cells/uL (ref 0–200)
Basophils Relative: 0.6 %
EOS ABS: 0 {cells}/uL — AB (ref 15–500)
EOS PCT: 0 %
HCT: 37.1 % (ref 35.0–45.0)
HEMOGLOBIN: 11.3 g/dL — AB (ref 11.7–15.5)
Lymphs Abs: 2832 cells/uL (ref 850–3900)
MCH: 22.4 pg — AB (ref 27.0–33.0)
MCHC: 30.5 g/dL — AB (ref 32.0–36.0)
MCV: 73.5 fL — AB (ref 80.0–100.0)
MONOS PCT: 6 %
MPV: 11.5 fL (ref 7.5–12.5)
NEUTROS ABS: 9030 {cells}/uL — AB (ref 1500–7800)
NEUTROS PCT: 71.1 %
PLATELETS: 395 10*3/uL (ref 140–400)
RBC: 5.05 10*6/uL (ref 3.80–5.10)
RDW: 14.6 % (ref 11.0–15.0)
Total Lymphocyte: 22.3 %
WBC: 12.7 10*3/uL — ABNORMAL HIGH (ref 3.8–10.8)

## 2018-08-22 LAB — THYROGLOBULIN ANTIBODIES (REFL): Thyroglobulin Ab: <1 IU/mL (ref <=1)

## 2018-08-22 LAB — COMPLETE METABOLIC PANEL WITH GFR
AG Ratio: 1.5 (calc) (ref 1.0–2.5)
ALBUMIN MSPROF: 4.1 g/dL (ref 3.6–5.1)
ALKALINE PHOSPHATASE (APISO): 88 U/L (ref 33–115)
ALT: 21 U/L (ref 6–29)
AST: 16 U/L (ref 10–30)
BILIRUBIN TOTAL: 0.4 mg/dL (ref 0.2–1.2)
BUN: 10 mg/dL (ref 7–25)
CHLORIDE: 104 mmol/L (ref 98–110)
CO2: 29 mmol/L (ref 20–32)
CREATININE: 0.97 mg/dL (ref 0.50–1.10)
Calcium: 10.1 mg/dL (ref 8.6–10.2)
GFR, Est African American: 84 mL/min/{1.73_m2} (ref >=60)
GFR, Est Non African American: 73 mL/min/{1.73_m2} (ref >=60)
GLOBULIN: 2.7 g/dL (ref 1.9–3.7)
Glucose, Bld: 100 mg/dL — ABNORMAL HIGH (ref 65–99)
POTASSIUM: 4.6 mmol/L (ref 3.5–5.3)
SODIUM: 141 mmol/L (ref 135–146)
Total Protein: 6.8 g/dL (ref 6.1–8.1)

## 2018-08-22 LAB — VITAMIN D 25 HYDROXY (VIT D DEFICIENCY, FRACTURES): Vit D, 25-Hydroxy: 11 ng/mL — ABNORMAL LOW (ref 30–100)

## 2018-08-22 LAB — TSH: TSH: 3.22 m[IU]/L

## 2018-08-22 LAB — T4, FREE: FREE T4: 1.5 ng/dL (ref 0.8–1.8)

## 2018-08-22 LAB — MAGNESIUM: Magnesium: 2 mg/dL (ref 1.5–2.5)

## 2018-08-22 LAB — THYROID PEROXIDASE ANTIBODY: Thyroperoxidase Ab SerPl-aCnc: <1 IU/mL (ref <9)

## 2018-08-22 MED ORDER — VITAMIN D (ERGOCALCIFEROL) 1.25 MG (50000 UNIT) PO CAPS: 50000.0000 [IU] | ORAL_CAPSULE | ORAL | 1 refills | Status: DC

## 2018-08-22 NOTE — Progress Notes (Signed)
Rx vit D weekly x 8 weeks, then 800 iu daily

## 2018-08-23 NOTE — Assessment & Plan Note (Signed)
Refer to cardiologist; this is likely related to her grossly morbid obesity

## 2018-08-23 NOTE — Assessment & Plan Note (Signed)
Explained that untreated sleep apnea can be fatal; can lead to heart failure, sudden death; so important to lose weight and get this treated; referral to pulmonologist for this

## 2018-08-29 ENCOUNTER — Encounter: Payer: Self-pay | Admitting: Nurse Practitioner

## 2018-08-29 ENCOUNTER — Ambulatory Visit: Payer: BC Managed Care – PPO | Admitting: Nurse Practitioner

## 2018-08-29 ENCOUNTER — Other Ambulatory Visit (HOSPITAL_COMMUNITY)
Admission: RE | Admit: 2018-08-29 | Discharge: 2018-08-29 | Disposition: A | Discharge status: Home / Self Care | Payer: BC Managed Care – PPO | Source: Ambulatory Visit | Attending: Nurse Practitioner | Admitting: Nurse Practitioner

## 2018-08-29 VITALS — BP 112/74 | HR 87 | Temp 98.6°F | Resp 16 | Ht 68.0 in | Wt >= 6400 oz

## 2018-08-29 DIAGNOSIS — R197 Diarrhea, unspecified: Secondary | ICD-10-CM

## 2018-08-29 DIAGNOSIS — N898 Other specified noninflammatory disorders of vagina: Secondary | ICD-10-CM

## 2018-08-29 DIAGNOSIS — R300 Dysuria: Secondary | ICD-10-CM

## 2018-08-29 DIAGNOSIS — N3001 Acute cystitis with hematuria: Secondary | ICD-10-CM | POA: Diagnosis not present

## 2018-08-29 DIAGNOSIS — R110 Nausea: Secondary | ICD-10-CM

## 2018-08-29 DIAGNOSIS — M5441 Lumbago with sciatica, right side: Secondary | ICD-10-CM

## 2018-08-29 LAB — POCT URINALYSIS DIPSTICK
Bilirubin, UA: NEGATIVE
Glucose, UA: NEGATIVE
Ketones, UA: NEGATIVE
Nitrite, UA: NEGATIVE
Protein, UA: NEGATIVE
Spec Grav, UA: 1.02 (ref 1.010–1.025)
Urobilinogen, UA: 0.2 E.U./dL
pH, UA: 7 (ref 5.0–8.0)

## 2018-08-29 MED ORDER — AMOXICILLIN-POT CLAVULANATE 875-125 MG PO TABS: 1.0000 | ORAL_TABLET | Freq: Two times a day (BID) | ORAL | 0 refills | Status: DC

## 2018-08-29 MED ORDER — TIZANIDINE HCL 4 MG PO TABS: 4.0000 mg | ORAL_TABLET | Freq: Every day | ORAL | 0 refills | Status: DC

## 2018-08-29 NOTE — Progress Notes (Signed)
Name: Melinda Fox   MRN: [id number]    DOB: Apr 09, 1976   Date:08/29/2018       Progress Note  Subjective  Chief Complaint  Chief Complaint  Patient presents with  . Chills    started this morning around 3am  . Diarrhea    for about 2 weeks. really soft and squishy or really watery. odor  . Headache    dull ache - hurts worse with sneezing  . Urinary Tract Infection    burning upon urination  . Nausea    no vomiting  . Leg Pain    right leg - dull constant pain  . Medication Refill    indomethacin    HPI  Patient endorses intermittent perineal itching and diarrhea for the past 1-2 months- no rectal itching. Has tried imodium and tums with mild relief but not much. States increased flatulence- states has had some episodes where she thought it was gas but had soiled herself. States developed chills this morning and headache. Patient also endorses dysuria intermittently for this one month time period. Has what she considers her normal vaginal discharge- white-clear.  No change in diet- states vegetarian diet. Has had 2 episodes of diarrhea in the last 24 hours. Son is also complaining of stomach issues as well. Endorses nausea yesterday no vomiting after she ate some saltine crackers- states thinks it is because of the gas.   Mid-back pain right side that shoots down right leg started a week ago. Took indomethacin yesterday with relief, took another one this morning due to pain returning. States sitting and standing makes it worse. Has had sciatic pain before- has not done any stretching this time.   Patient Active Problem List   Diagnosis Date Noted  . Elevated blood pressure reading 07/13/2017  . Moderate concentric left ventricular hypertrophy 06/01/2017  . Shortness of breath on exertion 05/17/2017  . Other fatigue 10/07/2015  . Iron deficiency anemia 07/01/2015  . Migraine without aura and with status migrainosus, not intractable 07/01/2015  . Major depression, recurrent,  full remission (HCC) 07/01/2015  . Obstructive sleep apnea 07/01/2015  . Subclinical hypothyroidism 07/01/2015  . History of pneumonia 07/01/2015  . Allergic rhinitis 07/01/2015  . Lumbosacral disc disease 07/01/2015  . Sciatica neuralgia 07/01/2015  . Morbid obesity with body mass index (BMI) of 60.0 to 69.9 in adult [facility](HCC) 07/01/2015    Past Medical History:  Diagnosis Date  . Allergy   . Anemia   . Chronic migraine without aura without status migrainosus, not intractable   . Depression   . Elevated blood-pressure reading without diagnosis of hypertension   . Moderate concentric left ventricular hypertrophy 06/01/2017   Echocardiogram October 2018  . Morbid obesity with body mass index (BMI) of 60.0 to 69.9 in adult [facility](HCC) 07/01/2015  . Scoliosis   . Sleep apnea, obstructive   . Thyroid disease     History reviewed. No pertinent surgical history.  Social History   Tobacco Use  . Smoking status: Never Smoker  . Smokeless tobacco: Never Used  Substance Use Topics  . Alcohol use: No    Alcohol/week: 0.0 standard drinks    Comment: once a year     Current Outpatient Medications:  .  albuterol (PROAIR HFA) 108 (90 Base) MCG/ACT inhaler, Inhale 2 puffs into the lungs every 4 (four) hours as needed for wheezing or shortness of breath., Disp: 1 Inhaler, Rfl: 0 .  budesonide-formoterol (SYMBICORT) 80-4.5 MCG/ACT inhaler, Inhale 2 puffs into the lungs 2 (two)  times daily., Disp: 1 Inhaler, Rfl: 0 .  indomethacin (INDOCIN) 50 MG capsule, Take 1 capsule (50 mg total) by mouth 3 (three) times daily as needed., Disp: 40 capsule, Rfl: 1 .  triamcinolone ointment (KENALOG) 0.5 %, Apply 1 application topically 2 (two) times daily. Just when needed; too strong for face, under the arms, groin, Disp: 30 g, Rfl: 1 .  vitamin B-12 (CYANOCOBALAMIN) 1000 MCG tablet, Take 1,000 mcg by mouth once a week., Disp: , Rfl:  .  Vitamin D, Ergocalciferol, (DRISDOL) 1.25 MG (50000 UT) CAPS capsule, Take 1  capsule (50,000 Units total) by mouth every 7 (seven) days., Disp: 4 capsule, Rfl: 1  Allergies  Allergen Reactions  . Sulfa Antibiotics Other (See Comments)    Was told that she had it as a child    ROS   No other specific complaints in a complete review of systems (except as listed in HPI above).  Objective  Vitals:   08/29/18 0904  BP: 112/74  Pulse: 87  Resp: 16  Temp: 98.6 F (37 C)  TempSrc: Oral  SpO2: 98%  Weight: (!) 435 lb 6.4 oz (197.5 kg)  Height: 5\' 8"  (1.727 m)    Body mass index is 66.2 kg/m.  Nursing Note and Vital Signs reviewed.  Physical Exam Constitutional:      Appearance: She is obese. She is not ill-appearing, toxic-appearing or diaphoretic.  HENT:     Head: Normocephalic and atraumatic.     Mouth/Throat:     Mouth: Mucous membranes are moist.     Pharynx: Oropharynx is clear.  Eyes:     Extraocular Movements: Extraocular movements intact.     Pupils: Pupils are equal, round, and reactive to light.  Neck:     Musculoskeletal: Neck supple.  Cardiovascular:     Rate and Rhythm: Normal rate and regular rhythm.  Pulmonary:     Effort: Pulmonary effort is normal.     Breath sounds: Normal breath sounds.  Abdominal:     General: Bowel sounds are normal.     Palpations: Abdomen is soft.     Tenderness: There is no abdominal tenderness. There is no right CVA tenderness or left CVA tenderness.  Musculoskeletal:     Lumbar back: She exhibits decreased range of motion. She exhibits no tenderness, no bony tenderness, no swelling, no edema and no deformity.     Right upper leg: She exhibits no tenderness, no bony tenderness and no swelling.       Legs:  Lymphadenopathy:     Cervical: No cervical adenopathy.  Skin:    General: Skin is warm and dry.     Findings: No rash.  Neurological:     General: No focal deficit present.     Mental Status: She is alert and oriented to person, place, and time. Mental status is at baseline.     GCS: GCS  eye subscore is 4. GCS verbal subscore is 5. GCS motor subscore is 6.     Sensory: No sensory deficit.     Gait: Gait normal.  Psychiatric:        Mood and Affect: Mood normal.        Thought Content: Thought content normal.      No results found for this or any previous visit (from the past 48 hour(s)).  Assessment & Plan  1. Dysuria - POCT urinalysis dipstick - Urine Culture - amoxicillin-clavulanate (AUGMENTIN) 875-125 MG tablet; Take 1 tablet by mouth 2 (two) times daily.  Dispense: 14 tablet; Refill: 0  2. Vaginal itching - Cervicovaginal ancillary only  3. Diarrhea, unspecified type You should hear back about stool culture results within 1 week of returning sample.  Continue to use Imodium sparingly.  Antibiotic for UTI will also cover strains of E. coli, and may assist with resolving diarrhea.  You may also want to consider trying a low FODMAP diet for the next 2 weeks.  If abdominal cramping, and diarrhea are still persistent please let us know we can refer you to GI.  You can take simethicone 80 mg up to 4 times a day to help with gas pain.  If this does not resolve symptoms I am happy to send a medication called Bentyl, which is an antispasmodic that can further decrease abdominal cramping pains related to diarrhea, just send [place] a message to let us know. - Stool Culture  4. Acute cystitis with hematuria Please take antibiotic twice a day with meals for the entire course.  I also recommend taking a probiotic anytime you are on antibiotic to help replenish your good gut health.  If you have any issues with this medication please do not hesitate to reach out to [place] and let us know.  Please drink at least 64 ounces of water a day. - amoxicillin-clavulanate (AUGMENTIN) 875-125 MG tablet; Take 1 tablet by mouth 2 (two) times daily.  Dispense: 14 tablet; Refill: 0  5. Acute back pain with sciatica, right Declined short-term refill of NSAID states she has enough from her old rx. Use  indomethacin to help decrease inflammation, use heat 20 minutes 4 times a day.  After using heat complete light stretching to decrease sciatic pain.  Can take tizanidine muscle relaxer at nighttime to help provide some relief to allow rest.  If symptoms are worsening please let us know we can refer you to physical therapy or orthopedic. - tiZANidine (ZANAFLEX) 4 MG tablet; Take 1 tablet (4 mg total) by mouth at bedtime.  Dispense: 10 tablet; Refill: 0  6. Nausea Declines nausea medicine.

## 2018-08-29 NOTE — Patient Instructions (Signed)
For back pain: Use indomethacin to help decrease inflammation, use heat 20 minutes 4 times a day.  After using heat complete light stretching to decrease sciatic pain.  Can take tizanidine muscle relaxer at nighttime to help provide some relief to allow rest.  If symptoms are worsening please let us know we can refer you to physical therapy or orthopedic.  For UTI: Please take antibiotic twice a day with meals for the entire course.  I also recommend taking a probiotic anytime you are on antibiotic to help replenish your good gut health.  If you have any issues with this medication please do not hesitate to reach out to [place] and let us know.  Please drink at least 64 ounces of water a day.  For vaginal itching: You should hear back about wet prep results within 1 week.  Please reach out to [place] if you have not heard via MyChart or phone call.  For diarrhea: You should hear back about stool culture results within 1 week of returning sample.  Continue to use Imodium sparingly.  Antibiotic for UTI will also cover strains of E. coli, and may assist with resolving diarrhea.  You may also want to consider trying a low FODMAP diet for the next 2 weeks.  If abdominal cramping, and diarrhea are still persistent please let us know we can refer you to GI.  You can take simethicone 80 mg up to 4 times a day to help with gas pain.  If this does not resolve symptoms I am happy to send a medication called Bentyl, which is an antispasmodic that can further decrease abdominal cramping pains related to diarrhea, just send [place] a message to let us know.

## 2018-08-30 LAB — URINE CULTURE
MICRO NUMBER:: 55116
SPECIMEN QUALITY:: ADEQUATE

## 2018-08-30 LAB — CERVICOVAGINAL ANCILLARY ONLY
Bacterial vaginitis: POSITIVE — AB
Candida vaginitis: NEGATIVE
Chlamydia: NEGATIVE
Neisseria Gonorrhea: NEGATIVE
TRICH (WINDOWPATH): NEGATIVE

## 2018-08-31 ENCOUNTER — Other Ambulatory Visit: Payer: Self-pay | Admitting: Nurse Practitioner

## 2018-08-31 DIAGNOSIS — N760 Acute vaginitis: Principal | ICD-10-CM

## 2018-08-31 DIAGNOSIS — B9689 Other specified bacterial agents as the cause of diseases classified elsewhere: Secondary | ICD-10-CM

## 2018-08-31 MED ORDER — METRONIDAZOLE 500 MG PO TABS: 500.0000 mg | ORAL_TABLET | Freq: Two times a day (BID) | ORAL | 0 refills | Status: DC

## 2018-09-21 ENCOUNTER — Institutional Professional Consult (permissible substitution): Payer: BC Managed Care – PPO | Admitting: Internal Medicine

## 2018-10-22 NOTE — Progress Notes (Signed)
Cardiology Office Note  Date:  10/23/2018   ID:  Melinda Fox, DOB: Sep 10, 1975, MRN: [id number]  PCP:  Kerman Passey, MD   Chief Complaint  Patient presents with  . Other    Dr. Sherie Don for irregular HB and Palpitations. Meds reviewed verbally with patient.     HPI:  Ms. Melinda Fox is a 43 y.o. female with a history of: Moderate concentric left ventricular hypertrophy Migraine without aura and with status migrainosus, not intractable Elevated blood pressure reading Shortness of breath on exertion Other fatigue Iron deficiency anemia Major depression, recurrent, full remission (HCC) Obstructive sleep apnea Subclinical hypothyroidism History of pneumonia Allergic rhinitis Lumbosacral disc disease Sciatica neuralgia Morbid obesity with BMI of 60.0-69.9 in adult [facility]) Who presents to the office by referral from Dr. Sherie Don for consultation of her paroxysmal tachycardia  INTERVAL HISTORY: The patient reports today for an initial visit.   She is doing well today . She woke up one day in January 2020 with her heart racing for about 7 hours. It started at 2:00 am and suddenly stopped at around 9:00 am. There is no prior history of similar episodes and denies any recent episodes. Denies eating or drinking anything the night before that could have caused it She does not have anything at home that can monitor her heart rate. Denies shortness of breath and dizziness during the episode. Discussed potential rhythms that her heart rate could be going into.   She had sleep apnea in the past. Her daughter tells her that she snores. Was not able to use CPAP machine due to the mask not fitting.  She has had thyroid issues in the past, and currently not on any thyroid medication.   Long discussion concerning her morbid obesity Previously thought her gastric bypass surgery  Reviewed lab work with her.   Non-smoker.  Today's Blood pressure 126/84 Total Chol 165/ LDL 97 CR 0.97 Glucose  100   DECLINED EKG on today's visit Previous EKGs from primary care reviewed showing normal sinus rhythm with rate 87 bpm no significant ST or T wave changes HR: 86   OTHER PAST MEDICAL HISTORY REVIEWED BY ME FOR TODAY'S VISIT:   PMH:   has a past medical history of Allergy, Anemia, Chronic migraine without aura without status migrainosus, not intractable, Depression, Elevated blood-pressure reading without diagnosis of hypertension, Moderate concentric left ventricular hypertrophy (06/01/2017), Morbid obesity with body mass index (BMI) of 60.0 to 69.9 in adult [facility]) (07/01/2015), Scoliosis, Sleep apnea, obstructive, and Thyroid disease.  PSH:   History reviewed. No pertinent surgical history.  Current Outpatient Medications  Medication Sig Dispense Refill  . albuterol (PROAIR HFA) 108 (90 Base) MCG/ACT inhaler Inhale 2 puffs into the lungs every 4 (four) hours as needed for wheezing or shortness of breath. 1 Inhaler 0  . budesonide-formoterol (SYMBICORT) 80-4.5 MCG/ACT inhaler Inhale 2 puffs into the lungs 2 (two) times daily. 1 Inhaler 0  . indomethacin (INDOCIN) 50 MG capsule Take 1 capsule (50 mg total) by mouth 3 (three) times daily as needed. 40 capsule 1  . propranolol (INDERAL) 20 MG tablet Take 1 tablet (20 mg total) by mouth 3 (three) times daily as needed. 90 tablet 3  . Vitamin D, Ergocalciferol, (DRISDOL) 1.25 MG (50000 UT) CAPS capsule Take 1 capsule (50,000 Units total) by mouth every 7 (seven) days. (Patient not taking: Reported on 10/23/2018) 4 capsule 1   No current facility-administered medications for this visit.      ALLERGIES:   Sulfa antibiotics  SOCIAL HISTORY:  The patient  reports that she has never smoked. She has never used smokeless tobacco. She reports that she does not drink alcohol or use drugs.   FAMILY HISTORY:   She was adopted. Family history is unknown by patient.    REVIEW OF SYSTEMS: Review of Systems  Constitutional: Negative.   Eyes:  Negative.   Respiratory: Negative.  Negative for shortness of breath.   Cardiovascular: Negative.   Gastrointestinal: Negative.   Genitourinary: Negative.   Musculoskeletal: Negative.   Neurological: Negative.  Negative for dizziness.  Psychiatric/Behavioral: Negative.   All other systems reviewed and are negative.    PHYSICAL EXAM: VS:  BP 126/84 (BP Location: Left Arm, Patient Position: Sitting, Cuff Size: Large)   Pulse 86   Ht 5\' 8"  (1.727 m)   Wt (!) 432 lb (196 kg)   BMI 65.69 kg/m  , BMI Body mass index is 65.69 kg/m.  GEN: Well nourished, well developed, in no acute distress, morbid obesity HEENT: normal Neck: no JVD, carotid bruits, or masses Cardiac: RRR; 1/6 systolic ejection murmur , no rubs, or gallops, trace non-pitting edema bilateral lower extremities Respiratory:  clear to auscultation bilaterally, normal work of breathing GI: soft, nontender, nondistended, + BS MS: no deformity or atrophy Skin: warm and dry, no rash Neuro:  Strength and sensation are intact Psych: euthymic mood, full affect  RECENT LABS: 08/21/2018: ALT 21; BUN 10; Creat 0.97; Hemoglobin 11.3; Magnesium 2.0; Platelets 395; Potassium 4.6; Sodium 141; TSH 3.22    LIPID PANEL: Lab Results  Component Value Date   CHOL 165 05/17/2017   HDL 48 (L) 05/17/2017   LDLCALC 97 05/17/2017   TRIG 102 05/17/2017      WEIGHT: Wt Readings from Last 3 Encounters:  10/23/18 (!) 432 lb (196 kg)  08/29/18 (!) 435 lb 6.4 oz (197.5 kg)  08/21/18 (!) 435 lb 8 oz (197.5 kg)     ASSESSMENT AND PLAN:  Paroxysmal tachycardia Suspect either atrial tachycardia or SVT First episode, no further episodes since that time Discussed various types of arrhythmia with her Did not seem to have any precipitating causes of her arrhythmia Plan: Recommend taking propranolol 20 mg as needed. If she starts to have more frequent episodes we could have her wear a event monitor Not having enough episodes to warrant  beta-blockers on a regular basis  Obstructive sleep apnea Does not want CPAP  Morbid obesity Interested in bariatric surgery Might consider looking into this during the summer as she is busy with school  LVH Seen on echocardiogram 2018 Likely contributing to very mild LVOT, appreciated by low-grade murmur on exam LVH possibly exacerbated by sleep apnea and morbid obesity     Total encounter time more than 60 minutes  Greater than 50% was spent in counseling and coordination of care with the patient  Patient seen in consultation for Dr. Sherie Don and will be referred back to her office for ongoing care of the issues detailed above  Disposition:   F/U  PRN  Further symptoms discussed I, Jesus Reyes am acting as a scribe for Julien Nordmann, M.D., Ph.D.  I, Julien Nordmann, M.D. Ph.D., have reviewed the above documentation for accuracy and completeness, and I agree with the above.   Signed, Dossie Arbour, M.D., Ph.D. 10/23/2018  [facility] [place], [place] [phone]

## 2018-10-23 ENCOUNTER — Ambulatory Visit: Payer: BC Managed Care – PPO | Admitting: Cardiovascular Disease

## 2018-10-23 ENCOUNTER — Encounter: Payer: Self-pay | Admitting: Cardiovascular Disease

## 2018-10-23 VITALS — BP 126/84 | HR 86 | Ht 68.0 in | Wt >= 6400 oz

## 2018-10-23 DIAGNOSIS — G4733 Obstructive sleep apnea (adult) (pediatric): Secondary | ICD-10-CM

## 2018-10-23 DIAGNOSIS — I479 Paroxysmal tachycardia, unspecified: Secondary | ICD-10-CM | POA: Diagnosis not present

## 2018-10-23 DIAGNOSIS — I517 Cardiomegaly: Secondary | ICD-10-CM

## 2018-10-23 MED ORDER — PROPRANOLOL HCL 20 MG PO TABS: 20.0000 mg | ORAL_TABLET | Freq: Three times a day (TID) | ORAL | 3 refills | Status: DC | PRN

## 2018-10-23 NOTE — Patient Instructions (Addendum)
Medication Instructions:  Your physician has recommended you make the following change in your medication:  1. TAKE propranolol 20 mg as needed for tachycardia  If you have frequent episodes, please call the office  If you need a refill on your cardiac medications before your next appointment, please call your pharmacy.    Lab work: No new labs needed   If you have labs (blood work) drawn today and your tests are completely normal, you will receive your results only by: Marland Kitchen MyChart Message (if you have MyChart) OR . A paper copy in the mail If you have any lab test that is abnormal or we need to change your treatment, we will call you to review the results.   Testing/Procedures: No new testing needed   Follow-Up: At [facility], you and your health needs are our priority.  As part of our continuing mission to provide you with exceptional heart care, we have created designated Provider Care Teams.  These Care Teams include your primary Cardiologist (physician) and Advanced Practice Providers (APPs -  Physician Assistants and Nurse Practitioners) who all work together to provide you with the care you need, when you need it.  . You will need a follow up appointment as needed  . Providers on your designated Care Team:   . Nicolasa Ducking, NP . Eula Listen, [name] . Marisue Ivan, [name]  Any Other Special Instructions Will Be Listed Below (If Applicable).  For educational health videos Log in to : [email or web address] Or : [email or web address], password : [email or web address]

## 2018-10-25 ENCOUNTER — Institutional Professional Consult (permissible substitution): Payer: BC Managed Care – PPO | Admitting: Internal Medicine

## 2018-12-14 ENCOUNTER — Telehealth: Payer: Self-pay | Admitting: Family Medicine

## 2018-12-14 ENCOUNTER — Other Ambulatory Visit: Payer: Self-pay | Admitting: Emergency Medicine

## 2018-12-14 DIAGNOSIS — R51 Headache: Principal | ICD-10-CM

## 2018-12-14 DIAGNOSIS — R519 Headache, unspecified: Secondary | ICD-10-CM

## 2018-12-14 MED ORDER — INDOMETHACIN 50 MG PO CAPS: 50.0000 mg | ORAL_CAPSULE | Freq: Three times a day (TID) | ORAL | 0 refills | Status: DC | PRN

## 2018-12-14 NOTE — Telephone Encounter (Signed)
Copied from CRM [id number]. Topic: Quick Communication - Rx Refill/Question >> Dec 14, 2018  8:40 AM Jaquita Rector A wrote: Medication: indomethacin (INDOCIN) 50 MG capsule  Patient say that she is all out and if it is possible to get this today because she has a terrible headache  Has the patient contacted their pharmacy? yes  (Agent: If no, request that the patient contact the pharmacy for the refill.) (Agent: If yes, when and what did the pharmacy advise?)  Preferred Pharmacy (with phone number or street name): [id number] [place],  - [address] AT [facility] [address] [phone] (Phone) [phone] (Fax    Agent: Please be advised that RX refills may take up to 3 business days. We ask that you follow-up with your pharmacy.

## 2018-12-14 NOTE — Telephone Encounter (Signed)
Order pend to you for refill

## 2019-02-05 ENCOUNTER — Encounter: Payer: BC Managed Care – PPO | Admitting: Family Medicine

## 2019-03-05 ENCOUNTER — Ambulatory Visit (INDEPENDENT_AMBULATORY_CARE_PROVIDER_SITE_OTHER): Payer: BC Managed Care – PPO | Admitting: Nurse Practitioner

## 2019-03-05 ENCOUNTER — Encounter: Payer: Self-pay | Admitting: Nurse Practitioner

## 2019-03-05 ENCOUNTER — Other Ambulatory Visit: Payer: Self-pay

## 2019-03-05 VITALS — HR 74 | Ht 68.0 in | Wt >= 6400 oz

## 2019-03-05 DIAGNOSIS — F3342 Major depressive disorder, recurrent, in full remission: Secondary | ICD-10-CM

## 2019-03-05 DIAGNOSIS — G4733 Obstructive sleep apnea (adult) (pediatric): Secondary | ICD-10-CM | POA: Diagnosis not present

## 2019-03-05 DIAGNOSIS — I479 Paroxysmal tachycardia, unspecified: Secondary | ICD-10-CM | POA: Diagnosis not present

## 2019-03-05 DIAGNOSIS — J410 Simple chronic bronchitis: Secondary | ICD-10-CM | POA: Insufficient documentation

## 2019-03-05 DIAGNOSIS — Z6841 Body Mass Index (BMI) 40.0 and over, adult: Secondary | ICD-10-CM

## 2019-03-05 MED ORDER — ALBUTEROL SULFATE HFA 108 (90 BASE) MCG/ACT IN AERS: 2.0000 | INHALATION_SPRAY | RESPIRATORY_TRACT | 2 refills | Status: DC | PRN

## 2019-03-05 MED ORDER — BUDESONIDE-FORMOTEROL FUMARATE 80-4.5 MCG/ACT IN AERO: 2.0000 | INHALATION_SPRAY | Freq: Two times a day (BID) | RESPIRATORY_TRACT | 11 refills | Status: DC

## 2019-03-05 NOTE — Patient Instructions (Addendum)
[facility]. [place], [place] p [phone]; referral is good for one year.

## 2019-03-05 NOTE — Progress Notes (Signed)
Virtual Visit via Video Note  I connected with Melinda Fox on 03/05/19 at  2:20 PM EDT by a video enabled telemedicine application and verified that I am speaking with the correct person using two identifiers.   Staff discussed the limitations of evaluation and management by telemedicine and the availability of in person appointments. The patient expressed understanding and agreed to proceed.  Patient location: home  My location: work office Other people present: none  HPI  Paroxysmal tachycardia Patient sees cardiologist Dr. Mariah MillingGollan. She is rx propanolol 20mg  TID PRN; last needed over 2 months ago. Has been wearing fitbit that monitors HR typically between 70-100, occasionally jumps up over 100.   Obstructive sleep apnea Patient uses CPAP, self-reported compliance 0% because it felt like she was being suffocated and tried multiple masks without relief.   Vitamin D deficiency Is not taking OTC vitamin D at this time, she is outside a good amount.   Morbid obesity States has always been overweight states nothing she seems to do feels like it helps. Is walking more, short distances. In the summer time she usually has 2 meals a day- brunch and dinner and doesn't usually snack. Eating vegetable daily.  Was referred to baritrics- but with covid and dad passing was unable to make appointments  Has been doing a CBT approach to weight loss- NOOM; has been doing it intermittently for 2 months.  Wt Readings from Last 3 Encounters:  03/05/19 (!) 421 lb 6.4 oz (191.1 kg)  10/23/18 (!) 432 lb (196 kg)  08/29/18 (!) 435 lb 6.4 oz (197.5 kg)   Chronic bronchitis prescribed Symbicort 2 puffs BID but she takes it here and there, uses albuterol a few times a week for wheezing- typically with exercise.   MDD Not currently on medication states she just manages with it.  PH[facility] 03/05/2019 08/29/2018 08/21/2018 04/25/2018 04/12/2018  Decreased Interest 0 0 0 2 1  Down,  Depressed, Hopeless 0 0 0 2 1  PHQ - 2 Score 0 0 0 4 2  Altered sleeping 0 0 0 1 1  Tired, decreased energy 0 0 0 1 1  Change in appetite 0 0 0 3 1  Feeling bad or failure about yourself  0 0 0 0 1  Trouble concentrating 0 0 0 2 1  Moving slowly or fidgety/restless 0 0 0 1 1  Suicidal thoughts 0 0 0 0 0  PHQ-9 Score 0 0 0 12 8  Difficult doing work/chores Not difficult at all Not difficult at all Not difficult at all Somewhat difficult Somewhat difficult     PHQ reviewed. Negative  Patient Active Problem List   Diagnosis Date Noted  . Paroxysmal tachycardia (HCC) 10/23/2018  . Morbid obesity (HCC) 10/23/2018  . Elevated blood pressure reading 07/13/2017  . Moderate concentric left ventricular hypertrophy 06/01/2017  . Shortness of breath on exertion 05/17/2017  . Other fatigue 10/07/2015  . Iron deficiency anemia 07/01/2015  . Migraine without aura and with status migrainosus, not intractable 07/01/2015  . Major depression, recurrent, full remission (HCC) 07/01/2015  . OSA (obstructive sleep apnea) 07/01/2015  . Subclinical hypothyroidism 07/01/2015  . History of pneumonia 07/01/2015  . Allergic rhinitis 07/01/2015  . Lumbosacral disc disease 07/01/2015  . Sciatica neuralgia 07/01/2015  . Morbid obesity with body mass index (BMI) of 60.0 to 69.9 in adult [facility](HCC) 07/01/2015    Past Medical History:  Diagnosis Date  . Allergy   . Anemia   . Chronic  migraine without aura without status migrainosus, not intractable   . Depression   . Elevated blood-pressure reading without diagnosis of hypertension   . Moderate concentric left ventricular hypertrophy 06/01/2017   Echocardiogram October 2018  . Morbid obesity with body mass index (BMI) of 60.0 to 69.9 in adult [facility]) 07/01/2015  . Scoliosis   . Sleep apnea, obstructive   . Thyroid disease     History reviewed. No pertinent surgical history.  Social History   Tobacco Use  . Smoking status: Never Smoker  . Smokeless  tobacco: Never Used  Substance Use Topics  . Alcohol use: No    Alcohol/week: 0.0 standard drinks    Comment: once a year     Current Outpatient Medications:  .  albuterol (PROAIR HFA) 108 (90 Base) MCG/ACT inhaler, Inhale 2 puffs into the lungs every 4 (four) hours as needed for wheezing or shortness of breath., Disp: 1 Inhaler, Rfl: 0 .  budesonide-formoterol (SYMBICORT) 80-4.5 MCG/ACT inhaler, Inhale 2 puffs into the lungs 2 (two) times daily., Disp: 1 Inhaler, Rfl: 0 .  indomethacin (INDOCIN) 50 MG capsule, Take 1 capsule (50 mg total) by mouth 3 (three) times daily as needed., Disp: 40 capsule, Rfl: 0 .  propranolol (INDERAL) 20 MG tablet, Take 1 tablet (20 mg total) by mouth 3 (three) times daily as needed., Disp: 90 tablet, Rfl: 3 .  Vitamin D, Ergocalciferol, (DRISDOL) 1.25 MG (50000 UT) CAPS capsule, Take 1 capsule (50,000 Units total) by mouth every 7 (seven) days., Disp: 4 capsule, Rfl: 1  Allergies  Allergen Reactions  . Sulfa Antibiotics Other (See Comments)    Was told that she had it as a child    ROS   No other specific complaints in a complete review of systems (except as listed in HPI above).  Objective  Vitals:   03/05/19 0934  Pulse: 74  Weight: (!) 421 lb 6.4 oz (191.1 kg)  Height: 5\' 8"  (1.727 m)     Body mass index is 64.07 kg/m.  Nursing Note and Vital Signs reviewed.  Physical Exam  Constitutional: Patient appears well-developed and well-nourished. No distress.  HENT: Head: Normocephalic and atraumatic. Cardiovascular: Normal rate Pulmonary/Chest: Effort normal  Musculoskeletal: Normal range of motion,  Neurological: alert and oriented, speech normal.  Skin: No rash noted. No erythema.  Psychiatric: Patient has a normal mood and affect. behavior is normal. Judgment and thought content normal.    Assessment & Plan  1. Morbid obesity with body mass index (BMI) of 60.0 to 69.9 in adult [facility]) Encouraged continue weight loss, baritatric  number given when she is ready to proceed.   2. Major depression, recurrent, partial remission ([place]) States manageable without medications at this time.   3. Paroxysmal tachycardia (HCC) PRN propanolol, hydration   4. OSA (obstructive sleep apnea) Will do repeat sleep study- but plans to hold off due to increase stressors.   5. Simple chronic bronchitis ([place]) Encouraged to take maintenance medication  - budesonide-formoterol (SYMBICORT) 80-4.5 MCG/ACT inhaler; Inhale 2 puffs into the lungs 2 (two) times daily.  Dispense: 1 Inhaler; Refill: 11 - albuterol (PROAIR HFA) 108 (90 Base) MCG/ACT inhaler; Inhale 2 puffs into the lungs every 4 (four) hours as needed for wheezing or shortness of breath.  Dispense: 18 g; Refill: 2   Follow Up Instructions:   6 months  I discussed the assessment and treatment plan with the patient. The patient was provided an opportunity to ask questions and all were answered. The patient agreed  with the plan and demonstrated an understanding of the instructions.   The patient was advised to call back or seek an in-person evaluation if the symptoms worsen or if the condition fails to improve as anticipated.  I provided 21 minutes of non-face-to-face time during this encounter.   Cheryle HorsfallElizabeth E Lavaughn Bisig, NP

## 2019-03-08 ENCOUNTER — Ambulatory Visit: Payer: BC Managed Care – PPO | Admitting: Nurse Practitioner

## 2019-04-10 ENCOUNTER — Ambulatory Visit (INDEPENDENT_AMBULATORY_CARE_PROVIDER_SITE_OTHER): Payer: BC Managed Care – PPO | Admitting: Nurse Practitioner

## 2019-04-10 ENCOUNTER — Encounter: Payer: Self-pay | Admitting: Nurse Practitioner

## 2019-04-10 ENCOUNTER — Other Ambulatory Visit: Payer: Self-pay

## 2019-04-10 VITALS — HR 83

## 2019-04-10 DIAGNOSIS — F331 Major depressive disorder, recurrent, moderate: Secondary | ICD-10-CM

## 2019-04-10 DIAGNOSIS — F411 Generalized anxiety disorder: Secondary | ICD-10-CM

## 2019-04-10 DIAGNOSIS — F410 Panic disorder [episodic paroxysmal anxiety] without agoraphobia: Secondary | ICD-10-CM

## 2019-04-10 MED ORDER — VENLAFAXINE HCL ER 37.5 MG PO CP24: 37.5000 mg | ORAL_CAPSULE | Freq: Every day | ORAL | 0 refills | Status: DC

## 2019-04-10 MED ORDER — HYDROXYZINE PAMOATE 25 MG PO CAPS: 25.0000 mg | ORAL_CAPSULE | Freq: Every day | ORAL | 0 refills | Status: DC | PRN

## 2019-04-10 NOTE — Progress Notes (Signed)
Virtual Visit via Video Note  I connected with Melinda Fox on 04/10/19 at  2:40 PM EDT by a video enabled telemedicine application and verified that I am speaking with the correct person using two identifiers.   Staff discussed the limitations of evaluation and management by telemedicine and the availability of in person appointments. The patient expressed understanding and agreed to proceed.  Patient location: home  My location: work office Other people present:  none HPI  Depression and anxiety  States she feels she is overwhelmed and drowning and is not coping well with life. 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 States her depression goes in waves- typically seasonal affective but it is hitting her pretty hard.  Medications she has tried: paxil- did not work well- made her sleep all day.  Has had panic attacks 2 in the last 2 weeks- was stuck in a "replay loop" shaky- lasted a couple of hours  GAD 7 : Generalized Anxiety Score 04/10/2019  Nervous, Anxious, on Edge 1  Control/stop worrying 1  Worry too much - different things 3  Trouble relaxing 3  Restless 1  Easily annoyed or irritable 3  Afraid - awful might happen 0  Total GAD 7 Score 12  Anxiety Difficulty Very difficult    PHQ2/9: Depression screen [facility] 2/9 04/10/2019 03/05/2019 08/29/2018 08/21/2018 04/25/2018  Decreased Interest 1 0 0 0 2  Down, Depressed, Hopeless 2 0 0 0 2  PHQ - 2 Score 3 0 0 0 4  Altered sleeping 1 0 0 0 1  Tired, decreased energy 3 0 0 0 1  Change in appetite 0 0 0 0 3  Feeling bad or failure about yourself  0 0 0 0 0  Trouble concentrating 3 0 0 0 2  Moving slowly or fidgety/restless 1 0 0 0 1  Suicidal thoughts 0 0 0 0 0  PHQ-9 Score 11 0 0 0 12  Difficult doing work/chores Very difficult Not difficult at all Not difficult at all Not difficult at all Somewhat difficult    PHQ reviewed. Positive  Patient  Active Problem List   Diagnosis Date Noted  . Simple chronic bronchitis ([place]) 03/05/2019  . Paroxysmal tachycardia ([place]) 10/23/2018  . Moderate concentric left ventricular hypertrophy 06/01/2017  . Iron deficiency anemia 07/01/2015  . Migraine without aura and with status migrainosus, not intractable 07/01/2015  . Major depression, recurrent, full remission ([place]) 07/01/2015  . OSA (obstructive sleep apnea) 07/01/2015  . Subclinical hypothyroidism 07/01/2015  . Allergic rhinitis 07/01/2015  . Lumbosacral disc disease 07/01/2015  . Sciatica neuralgia 07/01/2015  . Morbid obesity with body mass index (BMI) of 60.0 to 69.9 in adult [facility]) 07/01/2015    Past Medical History:  Diagnosis Date  . Allergy   . Anemia   . Chronic migraine without aura without status migrainosus, not intractable   . Depression   . Elevated blood-pressure reading without diagnosis of hypertension   . Moderate concentric left ventricular hypertrophy 06/01/2017   Echocardiogram October 2018  . Morbid obesity with body mass index (BMI) of 60.0 to 69.9 in adult [facility]) 07/01/2015  . Scoliosis   . Sleep apnea, obstructive   . Thyroid disease     History reviewed. No pertinent surgical history.  Social History   Tobacco Use  . Smoking status: Never Smoker  . Smokeless tobacco: Never Used  Substance Use Topics  .  Alcohol use: No    Alcohol/week: 0.0 standard drinks    Comment: once a year     Current Outpatient Medications:  .  albuterol (PROAIR HFA) 108 (90 Base) MCG/ACT inhaler, Inhale 2 puffs into the lungs every 4 (four) hours as needed for wheezing or shortness of breath., Disp: 18 g, Rfl: 2 .  budesonide-formoterol (SYMBICORT) 80-4.5 MCG/ACT inhaler, Inhale 2 puffs into the lungs 2 (two) times daily., Disp: 1 Inhaler, Rfl: 11 .  indomethacin (INDOCIN) 50 MG capsule, Take 1 capsule (50 mg total) by mouth 3 (three) times daily as needed., Disp: 40 capsule, Rfl: 0 .  propranolol (INDERAL) 20 MG tablet,  Take 1 tablet (20 mg total) by mouth 3 (three) times daily as needed. (Patient not taking: Reported on 04/10/2019), Disp: 90 tablet, Rfl: 3  Allergies  Allergen Reactions  . Sulfa Antibiotics Other (See Comments)    Was told that she had it as a child    ROS   No other specific complaints in a complete review of systems (except as listed in HPI above).  Objective  Vitals:   04/10/19 1402  Pulse: 83     There is no height or weight on file to calculate BMI.  Nursing Note and Vital Signs reviewed.  Physical Exam  Constitutional: Patient appears well-developed and well-nourished. No distress.  HENT: Head: Normocephalic and atraumatic. Cardiovascular: Normal rate Pulmonary/Chest: Effort normal  Psychiatric: Patient is anxious and tearful.     Assessment & Plan  1. Moderate episode of recurrent major depressive disorder (HCC) Recommended CBT, patient declined psychiatry referral at this time  - venlafaxine XR (EFFEXOR XR) 37.5 MG 24 hr capsule; Take 1 capsule (37.5 mg total) by mouth daily with breakfast.  Dispense: 90 capsule; Refill: 0  2. GAD (generalized anxiety disorder) - venlafaxine XR (EFFEXOR XR) 37.5 MG 24 hr capsule; Take 1 capsule (37.5 mg total) by mouth daily with breakfast.  Dispense: 90 capsule; Refill: 0  3. Panic attack - hydrOXYzine (VISTARIL) 25 MG capsule; Take 1 capsule (25 mg total) by mouth daily as needed for anxiety.  Dispense: 30 capsule; Refill: 0    Follow Up Instructions:   4 weeks.  I discussed the assessment and treatment plan with the patient. The patient was provided an opportunity to ask questions and all were answered. The patient agreed with the plan and demonstrated an understanding of the instructions.   The patient was advised to call back or seek an in-person evaluation if the symptoms worsen or if the condition fails to improve as anticipated.  I provided 15 minutes of non-face-to-face time during this  encounter.   Cheryle HorsfallElizabeth E Rebel Laughridge, NP

## 2019-04-10 NOTE — Patient Instructions (Addendum)
- Www.psychologytoday.com to look for a counselor or try online resources such as betterhelp app - start taking effexor daily with breakfast- follow-up in 4 weeks; report any side effects immediately - Take vistaril just as needed for acute panic.  Generalized Anxiety Disorder, Adult Generalized anxiety disorder (GAD) is a mental health disorder. People with this condition constantly worry about everyday events. Unlike normal anxiety, worry related to GAD is not triggered by a specific event. These worries also do not fade or get better with time. GAD interferes with life functions, including relationships, work, and school. GAD can vary from mild to severe. People with severe GAD can have intense waves of anxiety with physical symptoms (panic attacks). What are the causes? The exact cause of GAD is not known. What increases the risk? This condition is more likely to develop in:  Women.  People who have a family history of anxiety disorders.  People who are very shy.  People who experience very stressful life events, such as the death of a loved one.  People who have a very stressful family environment. What are the signs or symptoms? People with GAD often worry excessively about many things in their lives, such as their health and family. They may also be overly concerned about:  Doing well at work.  Being on time.  Natural disasters.  Friendships. Physical symptoms of GAD include:  Fatigue.  Muscle tension or having muscle twitches.  Trembling or feeling shaky.  Being easily startled.  Feeling like your heart is pounding or racing.  Feeling out of breath or like you cannot take a deep breath.  Having trouble falling asleep or staying asleep.  Sweating.  Nausea, diarrhea, or irritable bowel syndrome (IBS).  Headaches.  Trouble concentrating or remembering facts.  Restlessness.  Irritability. How is this diagnosed? Your health care provider can diagnose GAD  based on your symptoms and medical history. You will also have a physical exam. The health care provider will ask specific questions about your symptoms, including how severe they are, when they started, and if they come and go. Your health care provider may ask you about your use of alcohol or drugs, including prescription medicines. Your health care provider may refer you to a mental health specialist for further evaluation. Your health care provider will do a thorough examination and may perform additional tests to rule out other possible causes of your symptoms. To be diagnosed with GAD, a person must have anxiety that:  Is out of his or her control.  Affects several different aspects of his or her life, such as work and relationships.  Causes distress that makes him or her unable to take part in normal activities.  Includes at least three physical symptoms of GAD, such as restlessness, fatigue, trouble concentrating, irritability, muscle tension, or sleep problems. Before your health care provider can confirm a diagnosis of GAD, these symptoms must be present more days than they are not, and they must last for six months or longer. How is this treated? The following therapies are usually used to treat GAD:  Medicine. Antidepressant medicine is usually prescribed for long-term daily control. Antianxiety medicines may be added in severe cases, especially when panic attacks occur.  Talk therapy (psychotherapy). Certain types of talk therapy can be helpful in treating GAD by providing support, education, and guidance. Options include: ? Cognitive behavioral therapy (CBT). People learn coping skills and techniques to ease their anxiety. They learn to identify unrealistic or negative thoughts and behaviors and  to replace them with positive ones. ? Acceptance and commitment therapy (ACT). This treatment teaches people how to be mindful as a way to cope with unwanted thoughts and  feelings. ? Biofeedback. This process trains you to manage your body's response (physiological response) through breathing techniques and relaxation methods. You will work with a therapist while machines are used to monitor your physical symptoms.  Stress management techniques. These include yoga, meditation, and exercise. A mental health specialist can help determine which treatment is best for you. Some people see improvement with one type of therapy. However, other people require a combination of therapies. Follow these instructions at home:  Take over-the-counter and prescription medicines only as told by your health care provider.  Try to maintain a normal routine.  Try to anticipate stressful situations and allow extra time to manage them.  Practice any stress management or self-calming techniques as taught by your health care provider.  Do not punish yourself for setbacks or for not making progress.  Try to recognize your accomplishments, even if they are small.  Keep all follow-up visits as told by your health care provider. This is important. Contact a health care provider if:  Your symptoms do not get better.  Your symptoms get worse.  You have signs of depression, such as: ? A persistently sad, cranky, or irritable mood. ? Loss of enjoyment in activities that used to bring you joy. ? Change in weight or eating. ? Changes in sleeping habits. ? Avoiding friends or family members. ? Loss of energy for normal tasks. ? Feelings of guilt or worthlessness. Get help right away if:  You have serious thoughts about hurting yourself or others. If you ever feel like you may hurt yourself or others, or have thoughts about taking your own life, get help right away. You can go to your nearest emergency department or call:  Your local emergency services (911 in the [place]  A suicide crisis helpline, such as the National Suicide Prevention Lifeline at 671-151-80471-416-599-9068. This is open  24 hours a day. Summary  Generalized anxiety disorder (GAD) is a mental health disorder that involves worry that is not triggered by a specific event.  People with GAD often worry excessively about many things in their lives, such as their health and family.  GAD may cause physical symptoms such as restlessness, trouble concentrating, sleep problems, frequent sweating, nausea, diarrhea, headaches, and trembling or muscle twitching.  A mental health specialist can help determine which treatment is best for you. Some people see improvement with one type of therapy. However, other people require a combination of therapies. This information is not intended to replace advice given to you by your health care provider. Make sure you discuss any questions you have with your health care provider. Document Released: 11/27/2012 Document Revised: 07/15/2017 Document Reviewed: 06/22/2016 Elsevier Patient Education  2020 [employer]

## 2019-05-03 ENCOUNTER — Other Ambulatory Visit: Payer: Self-pay

## 2019-05-03 DIAGNOSIS — F410 Panic disorder [episodic paroxysmal anxiety] without agoraphobia: Secondary | ICD-10-CM

## 2019-05-03 MED ORDER — HYDROXYZINE PAMOATE 25 MG PO CAPS: 25.0000 mg | ORAL_CAPSULE | Freq: Every day | ORAL | 0 refills | Status: DC | PRN

## 2019-05-08 ENCOUNTER — Ambulatory Visit (INDEPENDENT_AMBULATORY_CARE_PROVIDER_SITE_OTHER): Payer: BC Managed Care – PPO | Admitting: Family Medicine

## 2019-05-08 DIAGNOSIS — F419 Anxiety disorder, unspecified: Secondary | ICD-10-CM | POA: Diagnosis not present

## 2019-05-08 DIAGNOSIS — F331 Major depressive disorder, recurrent, moderate: Secondary | ICD-10-CM

## 2019-05-08 DIAGNOSIS — G4733 Obstructive sleep apnea (adult) (pediatric): Secondary | ICD-10-CM | POA: Diagnosis not present

## 2019-05-08 DIAGNOSIS — G4700 Insomnia, unspecified: Secondary | ICD-10-CM

## 2019-05-08 DIAGNOSIS — R51 Headache: Secondary | ICD-10-CM

## 2019-05-08 DIAGNOSIS — Z6841 Body Mass Index (BMI) 40.0 and over, adult: Secondary | ICD-10-CM

## 2019-05-08 DIAGNOSIS — R519 Headache, unspecified: Secondary | ICD-10-CM

## 2019-05-08 MED ORDER — INDOMETHACIN 50 MG PO CAPS: 50.0000 mg | ORAL_CAPSULE | Freq: Three times a day (TID) | ORAL | 0 refills | Status: DC | PRN

## 2019-05-08 NOTE — Progress Notes (Signed)
Name: Melinda AmelKathleen Boze   MRN: [id number]    DOB: Aug 14, 1976   Date:05/08/2019       Progress Note  Subjective:     I connected with  Melinda Fox  on 05/08/19 at  2:20 PM EDT by a video enabled telemedicine application and verified that I am speaking with the correct person using two identifiers.  I discussed the limitations of evaluation and management by telemedicine and the availability of in person appointments. The patient expressed understanding and agreed to proceed. Staff also discussed with the patient that there may be a patient responsible charge related to this service. Patient Location: at home [facility] Additional Individuals present: none  Chief Complaint  Chief Complaint  Patient presents with   Depression   Pt presents via virtual visit for follow up on mood/depression/anxiety She had a visit with Lanora ManisElizabeth NP about a month ago -I will copy her HPI below, they started effexor 37.5 mg daily in the am, for worsening mood, depression and anxiety.  Patient has a long history of depression and anxiety since she was a child most of the time she is able to manage without any medications and she expresses reluctance and stigma about psychiatric medications multiple times during the visit.  She has been taking in the morning without any side effects or concerns but she also does not notice any improvement in her symptoms of her mood.  She was given hydroxyzine also to take as needed for anxiety she has not tried it. Depression screen [facility] 2/9 05/08/2019 04/10/2019 03/05/2019 08/29/2018 08/21/2018  Decreased Interest 2 1 0 0 0  Down, Depressed, Hopeless 2 2 0 0 0  PHQ - 2 Score 4 3 0 0 0  Altered sleeping 3 1 0 0 0  Tired, decreased energy 3 3 0 0 0  Change in appetite 0 0 0 0 0  Feeling bad or failure about yourself  3 0 0 0 0  Trouble concentrating 2 3 0 0 0  Moving slowly or fidgety/restless 0 1 0 0 0  Suicidal thoughts 0 0 0 0 0  PHQ-9 Score 15 11 0 0  0  Difficult doing work/chores Somewhat difficult Very difficult Not difficult at all Not difficult at all Not difficult at all  Some recent data might be hidden    GAD 7 : Generalized Anxiety Score 05/08/2019 04/10/2019  Nervous, Anxious, on Edge 1 1  Control/stop worrying 2 1  Worry too much - different things 1 3  Trouble relaxing 2 3  Restless 1 1  Easily annoyed or irritable 2 3  Afraid - awful might happen 0 0  Total GAD 7 Score 9 12  Anxiety Difficulty Somewhat difficult Very difficult   I reviewed her PHQ and gad 7 today, when asking about suicidal thoughts she expressed that she does not have any active suicidal ideations or plan but several times in the past she has had SI and as a younger child and teenager she had some form of a plan or attempt when I asked her more specifically about it she said "which time.  When asking about other history for her or family members she states that her mother and father are dead she has no brothers and sisters, she grew up in a household in the [id number] where her parents did not believe the medication.  She endorses a suspected history of ADHD she is currently having much more difficulty focusing and completing tasks.  She is  getting irritated and agitated finding that she is having difficulty dealing with bad days at work or anything more stressful. She also has worsening insomnia she has no difficulty falling asleep but she wakes up multiple times a night.  She does have some pain in her neck and fingers, has obstructive sleep apnea and she is unable to tolerate the mask, she suspects these things may be waking her up but she denies any nocturia, nightmares, palpitations sweats or panic attacks that are in the middle the night. She is taking some cold and cough medicine that does seem to help her sleep a little bit better than nothing, she is tried melatonin without any help.  In the past she endorses being on Paxil and thyroid medication said it made her  unconscious for 3 days.  She has tried for short periods of time multiple other medications but she does not know the names or doses of any of them and in reviewing her chart there is no other medications available in this electronic medical record, from what she has tried in the past.  She does ask for refill on indomethacin which she uses as a very last resort for headaches.  She tries everything else possible to get them to go away first before using indomethacin, she has no history of chronic kidney disease, she uses it very sparingly, only 2-3 times a month.  She has been on it for several years she states is the only thing that works for her.   04/10/2019 HPI with Sharyon Cable NP: Depression and anxiety  States she feels she is overwhelmed and drowning and is not coping well with life. 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 States her depression goes in waves- typically seasonal affective but it is hitting her pretty hard.  Medications she has tried: paxil- did not work well- made her sleep all day.  Has had panic attacks 2 in the last 2 weeks- was stuck in a "replay loop" shaky- lasted a couple of hours  Patient Active Problem List   Diagnosis Date Noted   Simple chronic bronchitis (HCC) 03/05/2019   Paroxysmal tachycardia (HCC) 10/23/2018   Moderate concentric left ventricular hypertrophy 06/01/2017   Iron deficiency anemia 07/01/2015   Migraine without aura and with status migrainosus, not intractable 07/01/2015   Major depression, recurrent, full remission (HCC) 07/01/2015   OSA (obstructive sleep apnea) 07/01/2015   Subclinical hypothyroidism 07/01/2015   Allergic rhinitis 07/01/2015   Lumbosacral disc disease 07/01/2015   Sciatica neuralgia 07/01/2015   Morbid obesity with body mass index (BMI) of 60.0 to 69.9 in adult (HCC) 07/01/2015    No past surgical history on  file.  Family History  Adopted: Yes  Family history unknown: Yes    Social History   Socioeconomic History   Marital status: Single    Spouse name: Not on file   Number of children: Not on file   Years of education: Not on file   Highest education level: Not on file  Occupational History   Not on file  Social Needs   Financial resource strain: Not on file   Food insecurity    Worry: Not on file    Inability: Not on file   Transportation needs    Medical: Not on file    Non-medical: Not on file  Tobacco Use   Smoking status: Never Smoker  Smokeless tobacco: Never Used  Substance and Sexual Activity   Alcohol use: No    Alcohol/week: 0.0 standard drinks    Comment: once a year   Drug use: No   Sexual activity: Never  Lifestyle   Physical activity    Days per week: Not on file    Minutes per session: Not on file   Stress: Not on file  Relationships   Social connections    Talks on phone: Not on file    Gets together: Not on file    Attends religious service: Not on file    Active member of club or organization: Not on file    Attends meetings of clubs or organizations: Not on file    Relationship status: Not on file   Intimate partner violence    Fear of current or ex partner: Not on file    Emotionally abused: Not on file    Physically abused: Not on file    Forced sexual activity: Not on file  Other Topics Concern   Not on file  Social History Narrative   Not on file     Current Outpatient Medications:    albuterol (PROAIR HFA) 108 (90 Base) MCG/ACT inhaler, Inhale 2 puffs into the lungs every 4 (four) hours as needed for wheezing or shortness of breath., Disp: 18 g, Rfl: 2   budesonide-formoterol (SYMBICORT) 80-4.5 MCG/ACT inhaler, Inhale 2 puffs into the lungs 2 (two) times daily., Disp: 1 Inhaler, Rfl: 11   hydrOXYzine (VISTARIL) 25 MG capsule, Take 1 capsule (25 mg total) by mouth daily as needed for anxiety., Disp: 30 capsule,  Rfl: 0   indomethacin (INDOCIN) 50 MG capsule, Take 1 capsule (50 mg total) by mouth 3 (three) times daily as needed., Disp: 40 capsule, Rfl: 0   propranolol (INDERAL) 20 MG tablet, Take 1 tablet (20 mg total) by mouth 3 (three) times daily as needed. (Patient not taking: Reported on 04/10/2019), Disp: 90 tablet, Rfl: 3   venlafaxine XR (EFFEXOR XR) 37.5 MG 24 hr capsule, Take 1 capsule (37.5 mg total) by mouth daily with breakfast., Disp: 90 capsule, Rfl: 0  Allergies  Allergen Reactions   Sulfa Antibiotics Other (See Comments)    Was told that she had it as a child    I personally reviewed active problem list, medication list, allergies, family history, social history, health maintenance, notes from last encounter, lab results, imaging with the patient/caregiver today.  Review of Systems  Constitutional: Negative.  Negative for activity change, appetite change, chills, diaphoresis, fatigue and fever.  HENT: Negative.   Eyes: Negative.   Respiratory: Negative.  Negative for chest tightness.   Cardiovascular: Negative.  Negative for chest pain.  Gastrointestinal: Negative.  Negative for abdominal pain, constipation, diarrhea, nausea and vomiting.  Endocrine: Negative.   Genitourinary: Negative.   Musculoskeletal: Negative.   Skin: Negative.   Allergic/Immunologic: Negative.   Neurological: Positive for headaches. Negative for dizziness, tremors, seizures, syncope, facial asymmetry, speech difficulty, weakness and light-headedness.  Hematological: Negative.   Psychiatric/Behavioral: Positive for agitation, decreased concentration, dysphoric mood and sleep disturbance. Negative for confusion, hallucinations, self-injury and suicidal ideas. The patient is nervous/anxious. The patient is not hyperactive.   All other systems reviewed and are negative.     Objective:    Virtual encounter, vitals limited, only able to obtain the following There were no vitals filed for this  visit. There is no height or weight on file to calculate BMI. Nursing Note and Vital Signs reviewed.  Physical Exam Vitals signs and nursing note reviewed.  Constitutional:      General: She is not in acute distress.    Appearance: She is well-developed. She is obese. She is not ill-appearing, toxic-appearing or diaphoretic.  HENT:     Head: Normocephalic and atraumatic.     Nose: Nose normal.  Eyes:     General:        Right eye: No discharge.        Left eye: No discharge.     Conjunctiva/sclera: Conjunctivae normal.  Neck:     Trachea: Trachea and phonation normal. No tracheal deviation.  Pulmonary:     Effort: No tachypnea or respiratory distress.     Breath sounds: No stridor.  Musculoskeletal: Normal range of motion.  Skin:    General: Skin is warm and dry.     Findings: No rash.  Neurological:     Mental Status: She is alert.     Motor: No abnormal muscle tone.     Coordination: Coordination normal.  Psychiatric:        Attention and Perception: She is inattentive.        Mood and Affect: Mood is depressed. Mood is not anxious or elated. Affect is not labile, blunt or tearful.        Speech: Speech normal.        Behavior: Behavior is agitated and withdrawn. Behavior is not aggressive or hyperactive.        Thought Content: Thought content is not delusional. Thought content does not include homicidal or suicidal plan.        Cognition and Memory: Cognition normal.        Judgment: Judgment is not impulsive.     PE limited by telephone encounter    PHQ2/9: Depression screen [facility] 2/9 05/08/2019 04/10/2019 03/05/2019 08/29/2018 08/21/2018  Decreased Interest 2 1 0 0 0  Down, Depressed, Hopeless 2 2 0 0 0  PHQ - 2 Score 4 3 0 0 0  Altered sleeping 3 1 0 0 0  Tired, decreased energy 3 3 0 0 0  Change in appetite 0 0 0 0 0  Feeling bad or failure about yourself  3 0 0 0 0  Trouble concentrating 2 3 0 0 0  Moving slowly or fidgety/restless 0 1 0 0 0  Suicidal thoughts  0 0 0 0 0  PHQ-9 Score 15 11 0 0 0  Difficult doing work/chores Somewhat difficult Very difficult Not difficult at all Not difficult at all Not difficult at all  Some recent data might be hidden   PHQ-2/9 Result is positive - addressing today, dose adjustment for effexor, I advised specialist help, but pt refused psychiatric referral or therapy/counseling right now  Fall Risk: Fall Risk  04/10/2019 03/05/2019 08/29/2018 08/21/2018 06/16/2018  Falls in the past year? 0 1 0 0 0  Number falls in past yr: 0 1 0 0 -  Injury with Fall? 0 0 0 0 -     Assessment and Plan:       ICD-10-CM   1. Moderate episode of recurrent major depressive disorder (HCC)  F33.1    PHQ worse than last visit - she had a particularly "bad" week, but GAD 7 better than last week  pt appears very down and withdrawn She refused most of the proposed tx plan that I gave her, refuses meds, refuses referrals or counseling.  She was very hesitant to open up to me - but to be  fair, she is new to me  Plan to first titrate up Effexor to 75 mg for 1-2 weeks then 112.5 mg daily, if that does not help sx in the next 2-4 weeks pt will notify us and I would try lexapro or celexa.  Pt also refused to make a f/up appt and wants to initiate it herself, I am concerned that she will be lost to follow up.  Encouraged her to try hydroxyzine PRN for anxiety/aggitation and 25-50 mg at bedtime for insomnia  2. Anxiety disorder, unspecified type   F41.9    Pt refuses referral to specialist or counselor (see above)     3. OSA (obstructive sleep apnea)  G47.33    unable to tolerate mask on face some anxiety/claustrophobia, she has tried several, but cannot fall asleep with it, work on helping control anxiety, weight loss, possibly able to try using CPAP again in the future  4. Morbid obesity with body mass index (BMI) of 60.0 to 69.9 in adult ([place])  E66.01    Z68.44   5. Insomnia, unspecified type  G47.00    start with trial of hydroxyzine  25-50 mg 30-60 min before bedtime, would try other meds if pt requests when following up - XR meds to help her stay asleep - possibly trazodone, ambien XR (although I'd like to avoid) or lunesta etc.  6. Headache, unspecified headache type  R51 indomethacin (INDOCIN) 50 MG capsule    DISCONTINUED: indomethacin (INDOCIN) 50 MG capsule   3-4 x a month for episodic HA's, wants refill on indomethacin, sparingly use, discussed kidney function and dosing   Spent an extensive amount of time with the patient today trying to get to know her since she is new to me, trying to get the background of her history of anxiety and depression, she is very guarded, very hesitant to do medications although a sense that she feels she needs them.  Overall her PHQ and gad 7 scores are better than last month, will try to titrate doses up first before changing to a different medication.  She refuses a lot of additional help, medications and therapy is offered today.  She does not want a rescheduled appointment so she said she will give herself a reminder to follow-up with [place] in 2 to 4 weeks and she will initiate any med changes.  I discussed the assessment and treatment plan with the patient. The patient was provided an opportunity to ask questions and all were answered. The patient agreed with the plan and demonstrated an understanding of the instructions.  The patient was advised to call back or seek an in-person evaluation if the symptoms worsen or if the condition fails to improve as anticipated.  I provided 29 minutes of non-face-to-face time during this encounter.  Delsa Grana, [name] 09/22/204:58 PM

## 2019-05-29 ENCOUNTER — Ambulatory Visit: Payer: BC Managed Care – PPO | Admitting: Family Medicine

## 2019-06-05 ENCOUNTER — Ambulatory Visit (INDEPENDENT_AMBULATORY_CARE_PROVIDER_SITE_OTHER): Payer: BC Managed Care – PPO | Admitting: Family Medicine

## 2019-06-05 ENCOUNTER — Encounter: Payer: Self-pay | Admitting: Family Medicine

## 2019-06-05 ENCOUNTER — Other Ambulatory Visit: Payer: Self-pay

## 2019-06-05 VITALS — HR 79

## 2019-06-05 DIAGNOSIS — G4700 Insomnia, unspecified: Secondary | ICD-10-CM | POA: Diagnosis not present

## 2019-06-05 DIAGNOSIS — R519 Headache, unspecified: Secondary | ICD-10-CM

## 2019-06-05 DIAGNOSIS — F331 Major depressive disorder, recurrent, moderate: Secondary | ICD-10-CM | POA: Diagnosis not present

## 2019-06-05 DIAGNOSIS — F419 Anxiety disorder, unspecified: Secondary | ICD-10-CM

## 2019-06-05 MED ORDER — HYDROXYZINE PAMOATE 25 MG PO CAPS: 25.0000 mg | ORAL_CAPSULE | Freq: Every day | ORAL | 1 refills | Status: DC | PRN

## 2019-06-05 MED ORDER — VENLAFAXINE HCL ER 75 MG PO CP24: 75.0000 mg | ORAL_CAPSULE | Freq: Every day | ORAL | 1 refills | Status: DC

## 2019-06-05 NOTE — Progress Notes (Signed)
Name: Melinda Fox   MRN: [id number]    DOB: Aug 29, 1975   Date:06/05/2019       Progress Note  Subjective:     I connected with  [name]  on 06/05/19 at  3:00 PM EDT by a video enabled telemedicine application and verified that I am speaking with the correct person using two identifiers.  I discussed the limitations of evaluation and management by telemedicine and the availability of in person appointments. The patient expressed understanding and agreed to proceed. Staff also discussed with the patient that there may be a patient responsible charge related to this service. Patient Location: at home Provider Location: [facility] clinic Additional Individuals present: none   Pt presents via virtual visit for follow up on mood/depression/anxiety She had a visit with Benjamine Mola NP about a month ago -I will copy her HPI below, they started effexor 37.5 mg daily in the am, for worsening mood, depression and anxiety.  Patient has a long history of depression and anxiety since she was a child most of the time she is able to manage without any medications and she expresses reluctance and stigma about psychiatric medications multiple times during the visit.  She has been taking in the morning without any side effects or concerns but she also does not notice any improvement in her symptoms of her mood.  She was given hydroxyzine also to take as needed for anxiety she has not tried it. Depression screen [facility] 2/9 05/08/2019 04/10/2019 03/05/2019 08/29/2018  Decreased Interest 2 1 0 0  Down, Depressed, Hopeless 2 2 0 0  PHQ - 2 Score 4 3 0 0  Altered sleeping 3 1 0 0  Tired, decreased energy 3 3 0 0  Change in appetite 0 0 0 0  Feeling bad or failure about yourself  3 0 0 0  Trouble concentrating 2 3 0 0  Moving slowly or fidgety/restless 0 1 0 0  Suicidal thoughts 0 0 0 0  PHQ-9 Score 15 11 0 0  Difficult doing work/chores Somewhat difficult Very difficult Not difficult at all Not difficult at  all  Some recent data might be hidden    GAD 7 : Generalized Anxiety Score 05/08/2019 04/10/2019  Nervous, Anxious, on Edge 1 1  Control/stop worrying 2 1  Worry too much - different things 1 3  Trouble relaxing 2 3  Restless 1 1  Easily annoyed or irritable 2 3  Afraid - awful might happen 0 0  Total GAD 7 Score 9 12  Anxiety Difficulty Somewhat difficult Very difficult   I reviewed her PHQ and gad 7 today, when asking about suicidal thoughts she expressed that she does not have any active suicidal ideations or plan but several times in the past she has had SI and as a younger child and teenager she had some form of a plan or attempt when I asked her more specifically about it she said "which time.  When asking about other history for her or family members she states that her mother and father are dead she has no brothers and sisters, she grew up in a household in the 64s where her parents did not believe the medication.  She endorses a suspected history of ADHD she is currently having much more difficulty focusing and completing tasks.  She is getting irritated and agitated finding that she is having difficulty dealing with bad days at work or anything more stressful. She also has worsening insomnia she has no difficulty  falling asleep but she wakes up multiple times a night.  She does have some pain in her neck and fingers, has obstructive sleep apnea and she is unable to tolerate the mask, she suspects these things may be waking her up but she denies any nocturia, nightmares, palpitations sweats or panic attacks that are in the middle the night. She is taking some cold and cough medicine that does seem to help her sleep a little bit better than nothing, she is tried melatonin without any help.  In the past she endorses being on Paxil and thyroid medication said it made her unconscious for 3 days.  She has tried for short periods of time multiple other medications but she does not know the names or  doses of any of them and in reviewing her chart there is no other medications available in this electronic medical record, from what she has tried in the past.  She does ask for refill on indomethacin which she uses as a very last resort for headaches.  She tries everything else possible to get them to go away first before using indomethacin, she has no history of chronic kidney disease, she uses it very sparingly, only 2-3 times a month.  She has been on it for several years she states is the only thing that works for her.   04/10/2019 HPI with Sharyon Cable NP: Depression and anxiety  States she feels she is overwhelmed and drowning and is not coping well with life. 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 States her depression goes in waves- typically seasonal affective but it is hitting her pretty hard.  Medications she has tried: paxil- did not work well- made her sleep all day.  Has had panic attacks 2 in the last 2 weeks- was stuck in a "replay loop" shaky- lasted a couple of hours  Patient Active Problem List   Diagnosis Date Noted   Simple chronic bronchitis (HCC) 03/05/2019   Paroxysmal tachycardia (HCC) 10/23/2018   Moderate concentric left ventricular hypertrophy 06/01/2017   Iron deficiency anemia 07/01/2015   Migraine without aura and with status migrainosus, not intractable 07/01/2015   Major depression, recurrent, full remission (HCC) 07/01/2015   OSA (obstructive sleep apnea) 07/01/2015   Subclinical hypothyroidism 07/01/2015   Allergic rhinitis 07/01/2015   Lumbosacral disc disease 07/01/2015   Sciatica neuralgia 07/01/2015   Morbid obesity with body mass index (BMI) of 60.0 to 69.9 in adult (HCC) 07/01/2015    No past surgical history on file.  Family History  Adopted: Yes  Family history unknown: Yes    Social History   Socioeconomic History   Marital status:  Single    Spouse name: Not on file   Number of children: Not on file   Years of education: Not on file   Highest education level: Not on file  Occupational History   Not on file  Social Needs   Financial resource strain: Not on file   Food insecurity    Worry: Not on file    Inability: Not on file   Transportation needs    Medical: Not on file    Non-medical: Not on file  Tobacco Use   Smoking status: Never Smoker   Smokeless tobacco: Never Used  Substance and Sexual Activity   Alcohol use: No    Alcohol/week: 0.0 standard drinks    Comment: once a year  Drug use: No   Sexual activity: Never  Lifestyle   Physical activity    Days per week: Not on file    Minutes per session: Not on file   Stress: Not on file  Relationships   Social connections    Talks on phone: Not on file    Gets together: Not on file    Attends religious service: Not on file    Active member of club or organization: Not on file    Attends meetings of clubs or organizations: Not on file    Relationship status: Not on file   Intimate partner violence    Fear of current or ex partner: Not on file    Emotionally abused: Not on file    Physically abused: Not on file    Forced sexual activity: Not on file  Other Topics Concern   Not on file  Social History Narrative   Not on file     Current Outpatient Medications:    albuterol (PROAIR HFA) 108 (90 Base) MCG/ACT inhaler, Inhale 2 puffs into the lungs every 4 (four) hours as needed for wheezing or shortness of breath., Disp: 18 g, Rfl: 2   budesonide-formoterol (SYMBICORT) 80-4.5 MCG/ACT inhaler, Inhale 2 puffs into the lungs 2 (two) times daily., Disp: 1 Inhaler, Rfl: 11   hydrOXYzine (VISTARIL) 25 MG capsule, Take 1 capsule (25 mg total) by mouth daily as needed for anxiety., Disp: 30 capsule, Rfl: 0   indomethacin (INDOCIN) 50 MG capsule, Take 1 capsule (50 mg total) by mouth 3 (three) times daily as needed., Disp: 40  capsule, Rfl: 0   propranolol (INDERAL) 20 MG tablet, Take 1 tablet (20 mg total) by mouth 3 (three) times daily as needed., Disp: 90 tablet, Rfl: 3   venlafaxine XR (EFFEXOR XR) 37.5 MG 24 hr capsule, Take 1 capsule (37.5 mg total) by mouth daily with breakfast., Disp: 90 capsule, Rfl: 0  Allergies  Allergen Reactions   Sulfa Antibiotics Other (See Comments)    Was told that she had it as a child    I personally reviewed active problem list, medication list, allergies, family history, social history, health maintenance, notes from last encounter, lab results, imaging with the patient/caregiver today.  Review of Systems  Constitutional: Negative.  Negative for activity change, appetite change, chills, diaphoresis, fatigue and fever.  HENT: Negative.   Eyes: Negative.   Respiratory: Negative.  Negative for chest tightness.   Cardiovascular: Negative.  Negative for chest pain.  Gastrointestinal: Negative.  Negative for abdominal pain, constipation, diarrhea, nausea and vomiting.  Endocrine: Negative.   Genitourinary: Negative.   Musculoskeletal: Negative.   Skin: Negative.   Allergic/Immunologic: Negative.   Neurological: Positive for headaches. Negative for dizziness, tremors, seizures, syncope, facial asymmetry, speech difficulty, weakness and light-headedness.  Hematological: Negative.   Psychiatric/Behavioral: Positive for agitation, decreased concentration, dysphoric mood and sleep disturbance. Negative for confusion, hallucinations, self-injury and suicidal ideas. The patient is nervous/anxious. The patient is not hyperactive.   All other systems reviewed and are negative.     Objective:    Virtual encounter, vitals limited, only able to obtain the following Today's Vitals   06/05/19 1454  Pulse: 79   There is no height or weight on file to calculate BMI. Nursing Note and Vital Signs reviewed.  Physical Exam Vitals signs and nursing note reviewed.  Constitutional:        General: She is not in acute distress.    Appearance: She is well-developed. She is  obese. She is not ill-appearing, toxic-appearing or diaphoretic.  HENT:     Head: Normocephalic and atraumatic.     Nose: Nose normal.  Eyes:     General:        Right eye: No discharge.        Left eye: No discharge.     Conjunctiva/sclera: Conjunctivae normal.  Neck:     Trachea: Trachea and phonation normal. No tracheal deviation.  Pulmonary:     Effort: No tachypnea or respiratory distress.     Breath sounds: No stridor.  Musculoskeletal: Normal range of motion.  Skin:    General: Skin is warm and dry.     Findings: No rash.  Neurological:     Mental Status: She is alert.     Motor: No abnormal muscle tone.     Coordination: Coordination normal.  Psychiatric:        Attention and Perception: She is inattentive.        Mood and Affect: Mood is depressed. Mood is not anxious or elated. Affect is not labile, blunt or tearful.        Speech: Speech normal.        Behavior: Behavior is agitated and withdrawn. Behavior is not aggressive or hyperactive.        Thought Content: Thought content is not delusional. Thought content does not include homicidal or suicidal plan.        Cognition and Memory: Cognition normal.        Judgment: Judgment is not impulsive.     PE limited by telephone encounter    PHQ2/9: Depression screen [facility] 2/9 06/05/2019 05/08/2019 04/10/2019 03/05/2019 08/29/2018  Decreased Interest 0 0  Down, Depressed, Hopeless 0 0  PHQ - 2 Score 0 0  Altered sleeping - 3 1 0 0  Tired, decreased energy 0 0  Change in appetite 0 0 0 0 0  Feeling bad or failure about yourself  0 3 0 0 0  Trouble concentrating 0 0  Moving slowly or fidgety/restless 0 0 1 0 0  Suicidal thoughts 0 0 0 0 0  PHQ-9 Score - 15 11 0 0  Difficult doing work/chores Not difficult at all Somewhat difficult Very difficult Not difficult at all Not difficult at all  Some recent  data might be hidden   PHQ-2/9 Result is positive - addressing today, dose adjustment for effexor, I advised specialist help, but pt refused psychiatric referral or therapy/counseling right now  Fall Risk: Fall Risk  04/10/2019 03/05/2019 08/29/2018 08/21/2018 06/16/2018  Falls in the past year? 0 1 0 0 0  Number falls in past yr: 0 1 0 0 -  Injury with Fall? 0 0 0 0 -     Assessment and Plan:       ICD-10-CM   1. Moderate episode of recurrent major depressive disorder (HCC)  F33.1    PHQ worse than last visit - she had a particularly "bad" week, but GAD 7 better than last week  pt appears very down and withdrawn She refused most of the proposed tx plan that I gave her, refuses meds, refuses referrals or counseling.  She was very hesitant to open up to me - but to be fair, she is new to me  Plan to first titrate up Effexor to 75 mg for 1-2 weeks then 112.5 mg daily, if that does not help sx in the  next 2-4 weeks pt will notify us and I would try lexapro or celexa.  Pt also refused to make a f/up appt and wants to initiate it herself, I am concerned that she will be lost to follow up.  Encouraged her to try hydroxyzine PRN for anxiety/aggitation and 25-50 mg at bedtime for insomnia  2. Anxiety disorder, unspecified type   F41.9    Pt refuses referral to specialist or counselor (see above)     3. OSA (obstructive sleep apnea)  G47.33    unable to tolerate mask on face some anxiety/claustrophobia, she has tried several, but cannot fall asleep with it, work on helping control anxiety, weight loss, possibly able to try using CPAP again in the future  4. Morbid obesity with body mass index (BMI) of 60.0 to 69.9 in adult (HCC)  E66.01    Z68.44   5. Insomnia, unspecified type  G47.00    start with trial of hydroxyzine 25-50 mg 30-60 min before bedtime, would try other meds if pt requests when following up - XR meds to help her stay asleep - possibly trazodone, ambien XR (although I'd like to  avoid) or lunesta etc.  6. Headache, unspecified headache type  R51 indomethacin (INDOCIN) 50 MG capsule    DISCONTINUED: indomethacin (INDOCIN) 50 MG capsule   3-4 x a month for episodic HA's, wants refill on indomethacin, sparingly use, discussed kidney function and dosing   Spent an extensive amount of time with the patient today trying to get to know her since she is new to me, trying to get the background of her history of anxiety and depression, she is very guarded, very hesitant to do medications although a sense that she feels she needs them.  Overall her PHQ and gad 7 scores are better than last month, will try to titrate doses up first before changing to a different medication.  She refuses a lot of additional help, medications and therapy is offered today.  She does not want a rescheduled appointment so she said she will give herself a reminder to follow-up with [place] in 2 to 4 weeks and she will initiate any med changes.  I discussed the assessment and treatment plan with the patient. The patient was provided an opportunity to ask questions and all were answered. The patient agreed with the plan and demonstrated an understanding of the instructions.  The patient was advised to call back or seek an in-person evaluation if the symptoms worsen or if the condition fails to improve as anticipated.  I provided 29 minutes of non-face-to-face time during this encounter.  Danelle Berry, [name] 10/20/203:05 PM

## 2019-06-05 NOTE — Progress Notes (Signed)
Name: Melinda Fox   MRN: [id number]    DOB: Oct 02, 1975   Date:06/05/2019       Progress Note  Subjective:    Chief Complaint  Chief Complaint  Patient presents with   Depression    I connected with  Melinda Fox  on 06/05/19 at  3:00 PM EDT by a video enabled telemedicine application and verified that I am speaking with the correct person using two identifiers.  I discussed the limitations of evaluation and management by telemedicine and the availability of in person appointments. The patient expressed understanding and agreed to proceed. Staff also discussed with the patient that there may be a patient responsible charge related to this service. Patient Location:  home Provider Location: [facility] Additional Individuals present: none  HPI  F/up on moods effexor.  She did increase effexor to 75 mg daily and this seems to be helping her moods and she's coping better.   She would like to continue with 75 mg effexor dose for now. She is also taking hydroxyzine 50 mg at bedtime, sleeping better, but still by the afternoon is feeling tired like she needs a nap, also having some HA and neck pain, no sure if its from allergies/weather changes or from computer.  She is taking Excedrin for HA's but not helping them go completely away.    Neck pain for more than a month - worse - going to chiropractor Left hand pinky and ring finger on left hand have been numb - shes been referred by chiropractor to ortho for her neck and hand  HA's - hx of migraines has taken indomethacin 2 times in the last week and Excedrin, tylenol also, has seen specialist in the past, worse over the last month.   She typically gets tension HA or temples, last weekend her Gaylyn RongHa was more in the back of her head and she felt really heavy.  Vision not changed, no eye pain, no redness, eyes dry but that's her baseline.   On computers, 9am till afternoon, and then several hours in the evening.  Has prescription lenses  that block light, has screen setting to limit.       HPI from 05/08/2019 Pt presents via virtual visit for follow up on mood/depression/anxiety She had a visit with Lanora ManisElizabeth NP about a month ago -I will copy her HPI below, they started effexor 37.5 mg daily in the am, for worsening mood, depression and anxiety.  Patient has a long history of depression and anxiety since she was a child most of the time she is able to manage without any medications and she expresses reluctance and stigma about psychiatric medications multiple times during the visit.  She has been taking in the morning without any side effects or concerns but she also does not notice any improvement in her symptoms of her mood.  She was given hydroxyzine also to take as needed for anxiety she has not tried it. [facility] 2/9 05/08/2019 04/10/2019 03/05/2019 08/29/2018  Decreased Interest 2 1 0 0  Down, Depressed, Hopeless 2 2 0 0  PHQ - 2 Score 4 3 0 0  Altered sleeping 3 1 0 0  Tired, decreased energy 3 3 0 0  Change in appetite 0 0 0 0  Feeling bad or failure about yourself  3 0 0 0  Trouble concentrating 2 3 0 0  Moving slowly or fidgety/restless 0 1 0 0  Suicidal thoughts 0 0 0 0  PHQ-9 Score 15  11 0 0  Difficult doing work/chores Somewhat difficult Very difficult Not difficult at all Not difficult at all  Some recent data might be hidden    GAD 7 : Generalized Anxiety Score 05/08/2019 04/10/2019  Nervous, Anxious, on Edge 1 1  Control/stop worrying 2 1  Worry too much - different things 1 3  Trouble relaxing 2 3  Restless 1 1  Easily annoyed or irritable 2 3  Afraid - awful might happen 0 0  Total GAD 7 Score 9 12  Anxiety Difficulty Somewhat difficult Very difficult   I reviewed her PHQ and gad 7 today, when asking about suicidal thoughts she expressed that she does not have any active suicidal ideations or plan but several times in the past she has had SI and as a younger child and teenager she had some  form of a plan or attempt when I asked her more specifically about it she said "which time.  When asking about other history for her or family members she states that her mother and father are dead she has no brothers and sisters, she grew up in a household in the 9s where her parents did not believe the medication.  She endorses a suspected history of ADHD she is currently having much more difficulty focusing and completing tasks.  She is getting irritated and agitated finding that she is having difficulty dealing with bad days at work or anything more stressful. She also has worsening insomnia she has no difficulty falling asleep but she wakes up multiple times a night.  She does have some pain in her neck and fingers, has obstructive sleep apnea and she is unable to tolerate the mask, she suspects these things may be waking her up but she denies any nocturia, nightmares, palpitations sweats or panic attacks that are in the middle the night. She is taking some cold and cough medicine that does seem to help her sleep a little bit better than nothing, she is tried melatonin without any help.  In the past she endorses being on Paxil and thyroid medication said it made her unconscious for 3 days.  She has tried for short periods of time multiple other medications but she does not know the names or doses of any of them and in reviewing her chart there is no other medications available in this electronic medical record, from what she has tried in the past.  She does ask for refill on indomethacin which she uses as a very last resort for headaches.  She tries everything else possible to get them to go away first before using indomethacin, she has no history of chronic kidney disease, she uses it very sparingly, only 2-3 times a month.  She has been on it for several years she states is the only thing that works for her.   04/10/2019 HPI with Suezanne Cheshire NP: Depression and anxiety  States she feels she is  overwhelmed and drowning and is not coping well with life. 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 States her depression goes in waves- typically seasonal affective but it is hitting her pretty hard.  Medications she has tried: paxil- did not work well- made her sleep all day.  Has had panic attacks 2 in the last 2 weeks- was stuck in a "replay loop" shaky- lasted a couple of hours      Patient Active  Problem List   Diagnosis Date Noted   Simple chronic bronchitis (HCC) 03/05/2019   Paroxysmal tachycardia (HCC) 10/23/2018   Moderate concentric left ventricular hypertrophy 06/01/2017   Iron deficiency anemia 07/01/2015   Migraine without aura and with status migrainosus, not intractable 07/01/2015   Major depression, recurrent, full remission (HCC) 07/01/2015   OSA (obstructive sleep apnea) 07/01/2015   Subclinical hypothyroidism 07/01/2015   Allergic rhinitis 07/01/2015   Lumbosacral disc disease 07/01/2015   Sciatica neuralgia 07/01/2015   Morbid obesity with body mass index (BMI) of 60.0 to 69.9 in adult [facility]) 07/01/2015    No past surgical history on file.  Family History  Adopted: Yes  Family history unknown: Yes    Social History   Socioeconomic History   Marital status: Single    Spouse name: Not on file   Number of children: Not on file   Years of education: Not on file   Highest education level: Not on file  Occupational History   Not on file  Social Needs   Financial resource strain: Not on file   Food insecurity    Worry: Not on file    Inability: Not on file   Transportation needs    Medical: Not on file    Non-medical: Not on file  Tobacco Use   Smoking status: Never Smoker   Smokeless tobacco: Never Used  Substance and Sexual Activity   Alcohol use: No    Alcohol/week: 0.0 standard drinks    Comment: once a year   Drug use: No   Sexual  activity: Never  Lifestyle   Physical activity    Days per week: Not on file    Minutes per session: Not on file   Stress: Not on file  Relationships   Social connections    Talks on phone: Not on file    Gets together: Not on file    Attends religious service: Not on file    Active member of club or organization: Not on file    Attends meetings of clubs or organizations: Not on file    Relationship status: Not on file   Intimate partner violence    Fear of current or ex partner: Not on file    Emotionally abused: Not on file    Physically abused: Not on file    Forced sexual activity: Not on file  Other Topics Concern   Not on file  Social History Narrative   Not on file     Current Outpatient Medications:    albuterol (PROAIR HFA) 108 (90 Base) MCG/ACT inhaler, Inhale 2 puffs into the lungs every 4 (four) hours as needed for wheezing or shortness of breath., Disp: 18 g, Rfl: 2   budesonide-formoterol (SYMBICORT) 80-4.5 MCG/ACT inhaler, Inhale 2 puffs into the lungs 2 (two) times daily., Disp: 1 Inhaler, Rfl: 11   hydrOXYzine (VISTARIL) 25 MG capsule, Take 1 capsule (25 mg total) by mouth daily as needed for anxiety., Disp: 30 capsule, Rfl: 0   indomethacin (INDOCIN) 50 MG capsule, Take 1 capsule (50 mg total) by mouth 3 (three) times daily as needed., Disp: 40 capsule, Rfl: 0   propranolol (INDERAL) 20 MG tablet, Take 1 tablet (20 mg total) by mouth 3 (three) times daily as needed., Disp: 90 tablet, Rfl: 3   venlafaxine XR (EFFEXOR XR) 37.5 MG 24 hr capsule, Take 1 capsule (37.5 mg total) by mouth daily with breakfast., Disp: 90 capsule, Rfl: 0  Allergies  Allergen Reactions   Sulfa  Antibiotics Other (See Comments)    Was told that she had it as a child    I personally reviewed active problem list, medication list, allergies, family history, social history, health maintenance, notes from last encounter, lab results with the patient/caregiver today.  Review of  Systems  Constitutional: Negative for activity change, appetite change, chills, diaphoresis, fatigue, fever and unexpected weight change.  HENT: Positive for sneezing. Negative for postnasal drip, rhinorrhea, sinus pressure, sinus pain and sore throat.   Eyes: Negative.   Respiratory: Negative.   Cardiovascular: Negative.   Gastrointestinal: Negative.   Endocrine: Negative.   Genitourinary: Negative.   Musculoskeletal: Negative.   Skin: Negative.   Allergic/Immunologic: Negative.   Neurological: Negative.   Hematological: Negative.   Psychiatric/Behavioral: Negative.   All other systems reviewed and are negative.    Objective:    Virtual encounter, vitals limited, only able to obtain the following Today's Vitals   06/05/19 1454  Pulse: 79   There is no height or weight on file to calculate BMI. Nursing Note and Vital Signs reviewed.  Physical Exam Vitals signs and nursing note reviewed.  Constitutional:      Appearance: She is well-developed.  HENT:     Head: Normocephalic and atraumatic.     Nose: Nose normal.  Eyes:     General:        Right eye: No discharge.        Left eye: No discharge.     Conjunctiva/sclera: Conjunctivae normal.  Neck:     Trachea: No tracheal deviation.  Cardiovascular:     Rate and Rhythm: Normal rate and regular rhythm.  Pulmonary:     Effort: Pulmonary effort is normal. No respiratory distress.     Breath sounds: No stridor.  Skin:    Findings: No rash.  Neurological:     Mental Status: She is alert.     Motor: No abnormal muscle tone.     Coordination: Coordination normal.  Psychiatric:        Mood and Affect: Mood normal.        Behavior: Behavior normal.     PE limited by telephone encounter  No results found for this or any previous visit (from the past 72 hour(s)).  PHQ2/9: Depression screen [facility] 2/9 06/05/2019 05/08/2019 04/10/2019 03/05/2019 08/29/2018  Decreased Interest 2 2 1  0 0  Down, Depressed, Hopeless 3 2 2  0 0    PHQ - 2 Score 5 4 3  0 0  Altered sleeping - 3 1 0 0  Tired, decreased energy 1 3 3  0 0  Change in appetite 0 0 0 0 0  Feeling bad or failure about yourself  0 3 0 0 0  Trouble concentrating 1 2 3  0 0  Moving slowly or fidgety/restless 0 0 1 0 0  Suicidal thoughts 0 0 0 0 0  PHQ-9 Score - 15 11 0 0  Difficult doing work/chores Not difficult at all Somewhat difficult Very difficult Not difficult at all Not difficult at all  Some recent data might be hidden   PHQ-2/9 Result is negative.    Fall Risk: Fall Risk  04/10/2019 03/05/2019 08/29/2018 08/21/2018 06/16/2018  Falls in the past year? 0 1 0 0 0  Number falls in past yr: 0 1 0 0 -  Injury with Fall? 0 0 0 0 -     Assessment and Plan:     ICD-10-CM   1. Moderate episode of recurrent major depressive disorder (HCC)  F33.1 venlafaxine XR (EFFEXOR XR) 75 MG 24 hr capsule   mood improving on effexor 75 mg dose, PHQ score better, refill sent in for 75  mg  2. Anxiety disorder, unspecified type  F41.9 hydrOXYzine (VISTARIL) 25 MG capsule   improving- see above  3. Insomnia, unspecified type  G47.00 hydrOXYzine (VISTARIL) 25 MG capsule   vistaril helps, refill sent in  4. Headache, unspecified headache type  R51.9    suggested neuro referral, but pt declined, eye breaks from screens, tx allergies - but pt stated it doesn't help her, encouraged her to f/up if not improving         I discussed the assessment and treatment plan with the patient. The patient was provided an opportunity to ask questions and all were answered. The patient agreed with the plan and demonstrated an understanding of the instructions.  The patient was advised to call back or seek an in-person evaluation if the symptoms worsen or if the condition fails to improve as anticipated.  I provided 20 minutes of non-face-to-face time during this encounter.  Danelle Berry, [name] 10/20/203:06 PM

## 2019-06-06 ENCOUNTER — Encounter: Payer: Self-pay | Admitting: Orthopedic Surgery

## 2019-06-06 ENCOUNTER — Ambulatory Visit: Payer: BC Managed Care – PPO | Admitting: Orthopedic Surgery

## 2019-06-06 ENCOUNTER — Other Ambulatory Visit: Payer: Self-pay

## 2019-06-06 ENCOUNTER — Encounter: Payer: Self-pay | Admitting: Family Medicine

## 2019-06-06 VITALS — BP 132/105 | HR 86 | Ht 68.0 in | Wt >= 6400 oz

## 2019-06-06 DIAGNOSIS — G5622 Lesion of ulnar nerve, left upper limb: Secondary | ICD-10-CM | POA: Diagnosis not present

## 2019-06-06 DIAGNOSIS — R200 Anesthesia of skin: Secondary | ICD-10-CM | POA: Diagnosis not present

## 2019-06-06 MED ORDER — GABAPENTIN 100 MG PO CAPS: 100.0000 mg | ORAL_CAPSULE | Freq: Three times a day (TID) | ORAL | 2 refills | Status: DC

## 2019-06-06 NOTE — Addendum Note (Signed)
Addended by: Carole Civil on: 06/06/2019 03:03 PM   Modules accepted: Orders

## 2019-06-06 NOTE — Patient Instructions (Addendum)
Start gabapentin 100 mg tid    Cubital Tunnel Syndrome  Cubital tunnel syndrome is a condition that causes pain and weakness of the forearm and hand. It happens when one of the nerves that runs along the inside of the elbow joint (ulnar nerve) becomes irritated. This condition is usually caused by repeated arm motions that are done during sports or work-related activities. What are the causes? This condition may be caused by:  Increased pressure on the ulnar nerve at the elbow, arm, or forearm. This can result from: ? Irritation caused by repeated elbow bending. ? Poorly healed elbow fractures. ? Tumors in the elbow. These are usually noncancerous (benign). ? Scar tissue that develops in the elbow after an injury. ? Bony growths (spurs) near the ulnar nerve.  Stretching of the nerve due to loose elbow ligaments.  Trauma to the nerve at the elbow. What increases the risk? The following factors may make you more likely to develop this condition:  Doing manual labor that requires frequent bending of the elbow.  Playing sports that include repeated or strenuous throwing motions, such as baseball.  Playing contact sports, such as football or lacrosse.  Not warming up properly before activities.  Having diabetes.  Having an underactive thyroid (hypothyroidism). What are the signs or symptoms? Symptoms of this condition include:  Clumsiness or weakness of the hand.  Tenderness of the inner elbow.  Aching or soreness of the inner elbow, forearm, or fingers, especially the little finger or the ring finger.  Increased pain when forcing the elbow to bend.  Reduced control when throwing objects.  Tingling, numbness, or a burning feeling inside the forearm or in part of the hand or fingers, especially the little finger or the ring finger.  Sharp pains that shoot from the elbow down to the wrist and hand.  The inability to grip or pinch hard. How is this diagnosed? This  condition is diagnosed based on:  Your symptoms and medical history. Your health care provider will also ask for details about any injury.  A physical exam. You may also have tests, including:  Electromyogram (EMG). This test measures electrical signals sent by your nerves into the muscles.  Nerve conduction study. This test measures how well electrical signals pass through your nerves.  Imaging tests, such as X-rays, ultrasound, and MRI. These tests check for possible causes of your condition. How is this treated? This condition may be treated by:  Stopping the activities that are causing your symptoms to get worse.  Icing and taking medicines to reduce pain and swelling.  Wearing a splint to prevent your elbow from bending, or wearing an elbow pad where the ulnar nerve is closest to the skin.  Working with a physical therapist in less severe cases. This may help to: ? Decrease your symptoms. ? Improve the strength and range of motion of your elbow, forearm, and hand. If these treatments do not help, surgery may be needed. Follow these instructions at home: If you have a splint:  Wear the splint as told by your health care provider. Remove it only as told by your health care provider.  Loosen the splint if your fingers tingle, become numb, or turn cold and blue.  Keep the splint clean.  If the splint is not waterproof: ? Do not let it get wet. ? Cover it with a watertight covering when you take a bath or shower. Managing pain, stiffness, and swelling   If directed, put ice on the injured  area: ? Put ice in a plastic bag. ? Place a towel between your skin and the bag. ? Leave the ice on for 20 minutes, 2-3 times a day.  Move your fingers often to avoid stiffness and to lessen swelling.  Raise (elevate) the injured area above the level of your heart while you are sitting or lying down. General instructions  Take over-the-counter and prescription medicines only as told  by your health care provider.  Do any exercise or physical therapy as told by your health care provider.  Do not drive or use heavy machinery while taking prescription pain medicine.  If you were given an elbow pad, wear it as told by your health care provider.  Keep all follow-up visits as told by your health care provider. This is important. Contact a health care provider if:  Your symptoms get worse.  Your symptoms do not get better with treatment.  You have new pain.  Your hand on the injured side feels numb or cold. Summary  Cubital tunnel syndrome is a condition that causes pain and weakness of the forearm and hand.  You are more likely to develop this condition if you do work or play sports that involve repeated arm movements.  This condition is often treated by stopping repetitive activities, applying ice, and using anti-inflammatory medicines.  In rare cases, surgery may be needed. This information is not intended to replace advice given to you by your health care provider. Make sure you discuss any questions you have with your health care provider. Document Released: 08/02/2005 Document Revised: 12/19/2017 Document Reviewed: 12/19/2017 Elsevier Patient Education  [employer].

## 2019-06-06 NOTE — Progress Notes (Signed)
NEW PROBLEM OFFICE VISIT  Chief Complaint  Patient presents with  . 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  No trauma   Review of Systems  Constitutional: Positive for malaise/fatigue.  HENT: Positive for congestion.   Respiratory: Positive for shortness of breath and wheezing.   Musculoskeletal: Positive for back pain and neck pain.  Neurological: Positive for tingling, sensory change, weakness and headaches.  Psychiatric/Behavioral: Positive for depression.  All other systems reviewed and are negative.    Past Medical History:  Diagnosis Date  . Allergy   . Anemia   . Chronic migraine without aura without status migrainosus, not intractable   . Depression   . Elevated blood-pressure reading without diagnosis of hypertension   . Moderate concentric left ventricular hypertrophy 06/01/2017   Echocardiogram October 2018  . Morbid obesity with body mass index (BMI) of 60.0 to 69.9 in adult Melinda Fox) 07/01/2015  . Scoliosis   . Sleep apnea, obstructive   . Thyroid disease     History reviewed. No pertinent surgical history.  Family History  Adopted: Yes  Family history unknown: Yes   Social History   Tobacco Use  . Smoking status: Never Smoker  . Smokeless tobacco: Never Used  Substance Use Topics  . Alcohol use: No    Alcohol/week: 0.0 standard drinks    Comment: once a year  . Drug use: No    Allergies  Allergen Reactions  . Sulfa Antibiotics Other (See Comments)    Was told that she had it as a child    Current Meds  Medication Sig  . albuterol (PROAIR HFA) 108 (90 Base) MCG/ACT inhaler Inhale 2 puffs into the lungs every 4 (four) hours as needed for wheezing or shortness of breath.  .  budesonide-formoterol (SYMBICORT) 80-4.5 MCG/ACT inhaler Inhale 2 puffs into the lungs 2 (two) times daily.  . hydrOXYzine (VISTARIL) 25 MG capsule Take 1-4 capsules (25-100 mg total) by mouth daily as needed for anxiety (and or insomnia (take 30-60 min before bedtime)).  . indomethacin (INDOCIN) 50 MG capsule Take 1 capsule (50 mg total) by mouth 3 (three) times daily as needed.  . venlafaxine XR (EFFEXOR XR) 75 MG 24 hr capsule Take 1 capsule (75 mg total) by mouth daily with breakfast.    BP (!) 132/105   Pulse 86   Ht 5\' 8"  (1.727 m)   Wt (!) 427 lb (193.7 kg)   BMI 64.93 kg/m   Physical Exam Vitals signs and nursing note reviewed.  Constitutional:      Appearance: Normal appearance.  Neurological:     General: No focal deficit present.     Mental Status: She is alert and oriented to person, place, and time.     Sensory: Sensory deficit present.     Motor: Weakness present.     Coordination: Coordination normal.     Deep Tendon Reflexes: Reflexes normal.  Psychiatric:        Mood and Affect: Mood normal.     Ortho Exam Right hand alignment is normal.  No swelling range of motion is great stability is great strength is normal skin is intact  Left hand alignment looks normal there is no tenderness except at the medial epicondyles and just above it Tinel's negative  full range of motion elbow shoulder wrist and hand weakness in grip is noted no atrophy wrist and elbow are stable skin is intact sensory changes decreased sensation compared to the right primarily small finger but some of the ring finger involved as well   MEDICAL DECISION SECTION  Xrays were done at No x-rays but notes from Dr. Ladona Ridgel indicate that he is considering patient has carpal tunnel syndrome  My independent reading of xrays:  Not applicable  Encounter Diagnoses  Name Primary?  . Numbness of left hand   . Cubital tunnel syndrome, left Yes    PLAN: (Rx., injectx, surgery, frx, mri/ct) Recommend  nerve conduction study possible cubital tunnel syndrome  Start medication  Meds ordered this encounter  Medications  . gabapentin (NEURONTIN) 100 MG capsule    Sig: Take 1 capsule (100 mg total) by mouth 3 (three) times daily.    Dispense:  90 capsule    Refill:  2    Melinda Canada, MD  06/06/2019 3:02 PM

## 2019-06-26 ENCOUNTER — Telehealth: Payer: Self-pay | Admitting: Orthopedic Surgery

## 2019-06-26 DIAGNOSIS — R200 Anesthesia of skin: Secondary | ICD-10-CM

## 2019-06-26 DIAGNOSIS — G5622 Lesion of ulnar nerve, left upper limb: Secondary | ICD-10-CM

## 2019-06-26 NOTE — Telephone Encounter (Signed)
Patient called regarding the referral from visit date 06/06/19 for nerve conduction study, which she states she prefers for Dr Merlene Laughter, Linna Hoff. States was told to call back if had not heard anything in a week or two. Referral is indicated as provider Dr Ernestina Patches. States would prefer to not go to [facility]. Please advise.

## 2019-06-26 NOTE — Telephone Encounter (Signed)
I spoke to her, advised they did not get the referral, so I have resent the information and gave her the number to call and make appointment. She states she will call.

## 2019-06-26 NOTE — Telephone Encounter (Signed)
Will send to Dr Merlene Laughter.

## 2019-08-15 ENCOUNTER — Other Ambulatory Visit: Payer: Self-pay

## 2019-08-15 ENCOUNTER — Ambulatory Visit: Payer: BC Managed Care – PPO | Admitting: Orthopedic Surgery

## 2019-08-15 ENCOUNTER — Encounter: Payer: Self-pay | Admitting: Orthopedic Surgery

## 2019-08-15 VITALS — Temp 98.2°F | Ht 68.0 in | Wt >= 6400 oz

## 2019-08-15 DIAGNOSIS — R200 Anesthesia of skin: Secondary | ICD-10-CM

## 2019-08-15 NOTE — Progress Notes (Signed)
Study result follow-up  Review upper nerve studies for Melinda Fox 43 year old female with left upper extremity numbness and tingling small and ring finger.  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  . Numbness of left hand Yes

## 2019-08-15 NOTE — Patient Instructions (Signed)
Please follow-up with Dr. Merlene Laughter regarding the laboratory testing.  This does not appear to be a surgical issue of nerve compression

## 2019-08-26 ENCOUNTER — Other Ambulatory Visit: Payer: Self-pay | Admitting: Family Medicine

## 2019-08-26 DIAGNOSIS — F419 Anxiety disorder, unspecified: Secondary | ICD-10-CM

## 2019-08-26 DIAGNOSIS — G4700 Insomnia, unspecified: Secondary | ICD-10-CM

## 2019-08-28 ENCOUNTER — Telehealth: Payer: Self-pay | Admitting: Family Medicine

## 2019-08-28 NOTE — Telephone Encounter (Signed)
Spoke with pt to try to schedule her an appointment for the additional lab work and to discuss abnormal labs. She stated that [facility]. I asked her if she could just do a virtual visit and then do the labs at her place of choice and she was okay with that. She wanted something for this week but we are completely booked. I offered her several appointments for next week but she said she will be in class and she does not get out of class until after 4. I offered for her to contact us once she figured out her schedule and she said this is her schedule. I then told her that if we get a cancellation for this week that we will give her a call. She agreed with that. Are we able to work her in this week?

## 2019-08-28 NOTE — Telephone Encounter (Signed)
No I don't believe it was anything urgent.

## 2019-08-30 NOTE — Telephone Encounter (Signed)
Spoke with pt and she scheduled a virtual visit for tomorrow with Sheliah Mends

## 2019-08-31 ENCOUNTER — Encounter: Payer: Self-pay | Admitting: Family Medicine

## 2019-08-31 ENCOUNTER — Ambulatory Visit (INDEPENDENT_AMBULATORY_CARE_PROVIDER_SITE_OTHER): Payer: BC Managed Care – PPO | Admitting: Family Medicine

## 2019-08-31 VITALS — HR 86 | Ht 68.0 in | Wt >= 6400 oz

## 2019-08-31 DIAGNOSIS — G4700 Insomnia, unspecified: Secondary | ICD-10-CM

## 2019-08-31 DIAGNOSIS — R899 Unspecified abnormal finding in specimens from other organs, systems and tissues: Secondary | ICD-10-CM | POA: Diagnosis not present

## 2019-08-31 DIAGNOSIS — F419 Anxiety disorder, unspecified: Secondary | ICD-10-CM | POA: Diagnosis not present

## 2019-08-31 DIAGNOSIS — E039 Hypothyroidism, unspecified: Secondary | ICD-10-CM

## 2019-08-31 DIAGNOSIS — R635 Abnormal weight gain: Secondary | ICD-10-CM | POA: Diagnosis not present

## 2019-08-31 MED ORDER — TRAZODONE HCL 50 MG PO TABS: 25.0000 mg | ORAL_TABLET | Freq: Every evening | ORAL | 3 refills | Status: DC | PRN

## 2019-08-31 NOTE — Patient Instructions (Signed)
I have ordered the labs 2 different ways and we are calling [facility] to make sure that you can get those labs done there in [place]  We will try to see if they will release your other lab results to [place] but because we did not order them you still may need to go to [place] office and sign for release of records so that we can get them here I have checked all of my patient records that I am do to review any I do not currently have your lab work waiting to be reviewed and it is not scanned into the chart  I sent through trazodone or Desyrel which is used for insomnia, try 25 to 50 mg around bedtime -please send a MyChart message let me know if it works for you.  There are lots of other sleep aid options several of them are controlled substances which I like to avoid and a lot of them do have drowsiness as a side effect and I did not want to give you any medicines right now which would make you extremely drowsy but there are several other medications we could try please schedule follow-up visit so we can discuss -  if trazodone is not effective for you

## 2019-08-31 NOTE — Progress Notes (Signed)
Name: Melinda Fox   MRN: [id number]    DOB: 07-30-1976   Date:08/31/2019       Progress Note  Subjective:    Chief Complaint  Chief Complaint  Patient presents with  . abnormal labs    from another office    I connected with  Melinda Fox  on 08/31/19 at  1:20 PM EST by a video enabled telemedicine application and verified that I am speaking with the correct person using two identifiers.  I discussed the limitations of evaluation and management by telemedicine and the availability of in person appointments. The patient expressed understanding and agreed to proceed. Staff also discussed with the patient that there may be a patient responsible charge related to this service. Patient Location: pts vehicle -  Provider Location: [facility] clinic Additional Individuals present:   HPI  Pt has numbness in her hand for 6 months, seeing a chiropractor so he referred her to an ortho they sent her to neuro they did nerve test and labs and she wanted to discuss those and other labs today. Thyroid panel - pt wants full thyroid panel "extended panel"  She was on thyroid meds in the past when she was a teenager - and her past PCP was doing monitoring of her thyroid function, last labs about one year ago, last labs were done August 21, 2018, thyroglobulin antibody, thyroid peroxidase antibody free T4 and TSH were normal.  She would like to do her labs at [facility] lab in [place] [address]   Pt gaining weight even though she's walking more and eating better, calories decreased Hair loss - more than normal, skin dry, no bowel changes from baseline, energy is decreased - feels like she has to take naps some days, moods "fairly stable" but when something upsets her she tends to get much more aggravated. She is still taking 75 mg effexor.  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  I cannot see the labs patient stated she is requested the multiple times I even look through medical records that are pending review and I do not have any lab work for the patient.  We will try and get a copy of the paperwork from [place].  Patient's insomnia is worse despite taking Vistaril about 25 to 50 mg every night she states she is sleeping less hours, waking up more frequently, and is making her more tired and irritable throughout the day.      Patient Active Problem List   Diagnosis Date Noted  . Simple chronic bronchitis ([place]) 03/05/2019  . Paroxysmal tachycardia ([place]) 10/23/2018  . Moderate concentric left ventricular hypertrophy 06/01/2017  . Iron deficiency anemia 07/01/2015  . Migraine without aura and with status migrainosus, not intractable 07/01/2015  . Major depression, recurrent, full remission ([place]) 07/01/2015  . OSA (obstructive sleep apnea) 07/01/2015  . Subclinical hypothyroidism 07/01/2015  . Allergic rhinitis 07/01/2015  . Lumbosacral disc disease 07/01/2015  . Sciatica neuralgia 07/01/2015  . Morbid obesity with body mass index (BMI) of 60.0 to 69.9 in adult [facility]) 07/01/2015    Social History   Tobacco Use  . Smoking status: Never Smoker  . Smokeless tobacco: Never Used  Substance Use Topics  . Alcohol use: No    Alcohol/week: 0.0 standard drinks    Comment: once a year     Current Outpatient Medications:  .  albuterol (PROAIR HFA) 108 (90 Base) MCG/ACT inhaler, Inhale 2 puffs into the lungs every 4 (four) hours as needed for wheezing or shortness of breath., Disp: 18 g, Rfl: 2 .  budesonide-formoterol (SYMBICORT) 80-4.5 MCG/ACT inhaler, Inhale 2 puffs into the lungs 2 (two) times daily., Disp: 1 Inhaler, Rfl: 11 .  gabapentin (NEURONTIN) 100 MG capsule, Take 1 capsule (100 mg total) by mouth 3 (three) times daily., Disp: 90 capsule, Rfl: 2 .  hydrOXYzine (VISTARIL) 25 MG capsule, TAKE 1 TO 4 CAPSULES(25 TO 100 MG) BY MOUTH EVERY  NIGHT 30 TO 60 MINUTES BEFORE BEDTIME AS NEEDED FOR ANXIETY OR INSOMNIA, Disp: 60 capsule, Rfl: 1 .  indomethacin (INDOCIN) 50 MG capsule, Take 1 capsule (50 mg total) by mouth 3 (three) times daily as needed., Disp: 40 capsule, Rfl: 0 .  propranolol (INDERAL) 20 MG tablet, Take 1 tablet (20 mg total) by mouth 3 (three) times daily as needed., Disp: 90 tablet, Rfl: 3 .  venlafaxine XR (EFFEXOR XR) 75 MG 24 hr capsule, Take 1 capsule (75 mg total) by mouth daily with breakfast., Disp: 90 capsule, Rfl: 1  Allergies  Allergen Reactions  . Sulfa Antibiotics Other (See Comments)    Was told that she had it as a child    I personally reviewed active problem list, medication list, allergies, family history, social history, health maintenance, notes from last encounter, lab results, imaging with the patient/caregiver today. Reviewed last several visits, labs from over a year ago  Review of Systems  Constitutional: Negative.   HENT: Negative.   Eyes: Negative.   Respiratory: Negative.   Cardiovascular: Negative.   Gastrointestinal: Negative.   Endocrine: Negative.   Genitourinary: Negative.   Musculoskeletal: Negative.   Skin: Negative.   Allergic/Immunologic: Negative.   Neurological: Negative.   Hematological: Negative.   Psychiatric/Behavioral: Positive for agitation and sleep disturbance. Negative for dysphoric mood, self-injury and suicidal ideas. The patient is nervous/anxious.   All other systems reviewed and are negative.    Objective:   Virtual encounter, vitals limited, only able to obtain the following Today's Vitals   08/31/19 1141  Pulse: 86  Weight: (!) 427 lb (193.7 kg)  Height: 5\' 8"  (1.727 m)   Body mass index is 64.93 kg/m. Nursing Note and Vital Signs reviewed.  Physical Exam Vitals and nursing note reviewed.  Constitutional:      Appearance: She is obese.  Neurological:     Mental Status: She is alert.     PE limited by telephone encounter  No  results found for this or any previous visit (from the past 72 hour(s)).  Assessment and Plan:     ICD-10-CM   1. Abnormal laboratory test result  R89.9    do not have records to review her most recent labs done - [facility] - will review and upload to chart  2. Insomnia, unspecified type  G47.00    Worse insomnia despite Vistaril trial, will try trazodone, if trazodone fails may need to try or Belsomra?    3. Anxiety disorder, unspecified type  F41.9    pt reports stable continue effexor at same dose  4. Weight gain  R63.5 TSH    T4    T4    TSH   despite lifestyle efforts, check for recurrent hypothyroid with labs  5. Hypothyroidism, unspecified type  E03.9 TSH    T4    T4    TSH   hx of in the past,  pt requests lab monitoring like her prior PCP did, ordered T4 and TSH, no indication for autoimmune thyroid testing, [facility] to verify       - I discussed the assessment and treatment plan with the patient. The patient was provided an opportunity to ask questions and all were answered. The patient agreed with the plan and demonstrated an understanding of the instructions.  I provided 39 minutes of non-face-to-face time during this encounter. More than 50 min spent today on chart review, chart documentation, arranging lab orders, medications, follow-up care contacting outside labs and physicians, and additional 39 minutes spent the patient on a video call and telephone call  Danelle Berry, [name] 08/31/19 1:21 PM

## 2019-09-27 ENCOUNTER — Other Ambulatory Visit: Payer: Self-pay

## 2019-09-27 ENCOUNTER — Ambulatory Visit: Payer: BC Managed Care – PPO | Attending: Internal Medicine

## 2019-09-27 DIAGNOSIS — Z20822 Contact with and (suspected) exposure to covid-19: Secondary | ICD-10-CM

## 2019-09-28 LAB — NOVEL CORONAVIRUS, NAA: SARS-CoV-2, NAA: NOT DETECTED

## 2019-10-10 ENCOUNTER — Telehealth (INDEPENDENT_AMBULATORY_CARE_PROVIDER_SITE_OTHER): Payer: BC Managed Care – PPO | Admitting: Family Medicine

## 2019-10-10 ENCOUNTER — Encounter: Payer: Self-pay | Admitting: Family Medicine

## 2019-10-10 VITALS — HR 91 | Temp 96.6°F | Ht 68.0 in | Wt >= 6400 oz

## 2019-10-10 DIAGNOSIS — F419 Anxiety disorder, unspecified: Secondary | ICD-10-CM

## 2019-10-10 NOTE — Progress Notes (Signed)
Name: Melinda Fox   MRN: [id number]    DOB: 1976-08-04   Date:10/10/2019       Progress Note  Subjective:    Chief Complaint  Chief Complaint  Patient presents with  . Discuss FMLA    I connected with  [name]  on 10/10/19 at  8:00 AM EST by a video enabled telemedicine application and verified that I am speaking with the correct person using two identifiers.  I discussed the limitations of evaluation and management by telemedicine and the availability of in person appointments. The patient expressed understanding and agreed to proceed. Staff also discussed with the patient that there may be a patient responsible charge related to this service. 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  She has worsening anxiety sx, it has been gradually worsening all year with COVID pandemic, but acutely worse now that they have in person teaching, and she has not been able to get the vaccine yet.  She is concerned about her family and how to care for them if she happened to get sick .  Both of her parents have died, she's all alone, she is concerned with her risk factors for COVID, siting lung disease, migraines, GI sx, obesity.    Increased stress and anxiety has caused more migraines and stomach aches and diarrhea.  She has had 3 migraines this week, which is much more than her normal.  Her ability to focus, concentrate and do her job has been negatively affected.  She has tried effexor 75 mg but she states her anxiety is no better.  She is having difficulty sleeping and more physical sx over the past 1-2 months.  Her work performance is suffering as well.   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    Depression screen [facility] 2/9 10/10/2019 08/31/2019 06/05/2019  Decreased Interest 1 0 2  Down, Depressed, Hopeless 0 3 3  PHQ - 2 Score 1 3 5   Altered sleeping 2 3 -  Tired, decreased energy 3 3 1   Change in appetite 1 3 0  Feeling bad or failure about yourself  0 1 0  Trouble concentrating 3 3 1   Moving slowly or fidgety/restless 2 0 0  Suicidal thoughts 0 1 0  PHQ-9 Score 12 17 -  Difficult doing work/chores Very difficult Somewhat difficult Not difficult at all  Some recent data might be hidden   GAD 7 : Generalized Anxiety Score 05/08/2019 04/10/2019  Nervous, Anxious, on Edge 1 1  Control/stop worrying 2 1  Worry too much - different things 1 3  Trouble relaxing 2 3  Restless 1 1  Easily annoyed or irritable 2 3  Afraid - awful might happen 0 0  Total GAD 7 Score 9 12  Anxiety Difficulty Somewhat difficult Very difficult    Chart noted chronic bronchitis and pt is on daily symbicort and uses SABA prn  We did discuss her current COVID risk: COVID-19 Risk of Complications Current as of yesterday  1 0 - 2 Points: Low Risk  3 - 6 Points: Medium Risk  7 -  16 Points: High Risk    No Change    This score indicates the number of risk factors that a patient over the age of 58 has for severe illness or mortality from a COVID-19 infection. A lower score means fewer risk factors and is preferred.        Is Immunocompromised : No   Age: 44   Sex: Female   Has Chronic Pulmonary Disease: No   Has Congenital Heart Disease : No   Has Congestive Heart Failure: No   Has Coronary Artery Disease: No   Has Diabetes: No   Has End-Stage Liver Disease: No   Has End-Stage Renal Disease: No   Has Hypertension: No   Has Obesity: Yes   Resides in Nursing Home: No   Pregnant: No      Low risk, even with lung disease  We discussed the recent studies done in  [employer] with no transmission of COVID from students to staff - and encouraged her to do the basics-  mask, distance.  Screening for kids will help and staff should screen and have processes in place following the CDC and the state health department guidelines.    Pt is willing to back to psychiatry if it will help her paperwork and her sx.  She went to a specialist years ago in [place].       Patient Active Problem List   Diagnosis Date Noted  . Simple chronic bronchitis ([place]) 03/05/2019  . Paroxysmal tachycardia ([place]) 10/23/2018  . Moderate concentric left ventricular hypertrophy 06/01/2017  . Iron deficiency anemia 07/01/2015  . Migraine without aura and with status migrainosus, not intractable 07/01/2015  . Major depression, recurrent, full remission ([place]) 07/01/2015  . OSA (obstructive sleep apnea) 07/01/2015  . Subclinical hypothyroidism 07/01/2015  . Allergic rhinitis 07/01/2015  . Lumbosacral disc disease 07/01/2015  . Sciatica neuralgia 07/01/2015  . Morbid obesity with body mass index (BMI) of 60.0 to 69.9 in adult [facility]) 07/01/2015    Social History   Tobacco Use  . Smoking status: Never Smoker  . Smokeless tobacco: Never Used  Substance Use Topics  . Alcohol use: No    Alcohol/week: 0.0 standard drinks    Comment: once a year     Current Outpatient Medications:  .  albuterol (PROAIR HFA) 108 (90 Base) MCG/ACT inhaler, Inhale 2 puffs into the lungs every 4 (four) hours as needed for wheezing or shortness of breath., Disp: 18 g, Rfl: 2 .  budesonide-formoterol (SYMBICORT) 80-4.5 MCG/ACT inhaler, Inhale 2 puffs into the lungs 2 (two) times daily., Disp: 1 Inhaler, Rfl: 11 .  gabapentin (NEURONTIN) 100 MG capsule, Take 1 capsule (100 mg total) by mouth 3 (three) times daily., Disp: 90 capsule, Rfl: 2 .  indomethacin (INDOCIN) 50 MG capsule, Take 1 capsule (50 mg total) by mouth 3 (three) times daily as needed., Disp: 40 capsule, Rfl: 0 .  propranolol (INDERAL)  20 MG tablet, Take 1 tablet (20 mg total) by mouth 3 (three) times daily as needed., Disp: 90 tablet, Rfl: 3 .  traZODone (DESYREL) 50 MG tablet, Take 0.5-1 tablets (25-50 mg total) by mouth at bedtime as needed for sleep., Disp: 30 tablet, Rfl: 3 .  venlafaxine XR (EFFEXOR XR) 75 MG 24 hr capsule, Take 1 capsule (75 mg total) by mouth daily with breakfast., Disp: 90 capsule, Rfl: 1  Allergies  Allergen Reactions  . Sulfa Antibiotics Other (See Comments)    Was told that she had it as a child  I personally reviewed active problem list, medication list, allergies, family history, social history, health maintenance, notes from last encounter, lab results, imaging with the patient/caregiver today.   Review of Systems  10 Systems reviewed and are negative for acute change except as noted in the HPI.   Objective:   Virtual encounter, vitals limited, only able to obtain the following Today's Vitals   10/10/19 0756  Pulse: 91  Temp: (!) 96.6 F (35.9 C)  TempSrc: Temporal  Weight: (!) 432 lb (196 kg)  Height: 5\' 8"  (1.727 m)   Body mass index is 65.69 kg/m. Nursing Note and Vital Signs reviewed.  Physical Exam Alert, obese, NAD  PE limited by telephone encounter  No results found for this or any previous visit (from the past 72 hour(s)).  Assessment and Plan:     ICD-10-CM   1. Anxiety disorder, unspecified type  F41.9 Ambulatory referral to Psychiatry     -Red flags and when to present for emergency care or RTC including fever >101.19F, chest pain, shortness of breath, new/worsening/un-resolving symptoms, reviewed with patient at time of visit. Follow up and care instructions discussed and provided in AVS. - I discussed the assessment and treatment plan with the patient. The patient was provided an opportunity to ask questions and all were answered. The patient agreed with the plan and demonstrated an understanding of the instructions.  I provided 30 minutes of  non-face-to-face time during this encounter on call and additional time greater than 45 min   , [name] 10/10/19 8:09 AM

## 2019-10-11 ENCOUNTER — Encounter: Payer: Self-pay | Admitting: Family Medicine

## 2019-10-19 ENCOUNTER — Encounter: Payer: Self-pay | Admitting: Family Medicine

## 2019-10-19 LAB — T4: T4, Total: 6.9 ug/dL (ref 5.1–11.9)

## 2019-10-19 LAB — TSH: TSH: 2.99 mIU/L

## 2019-12-27 ENCOUNTER — Ambulatory Visit: Payer: Self-pay | Admitting: *Deleted

## 2019-12-27 ENCOUNTER — Encounter (HOSPITAL_COMMUNITY): Payer: Self-pay

## 2019-12-27 ENCOUNTER — Ambulatory Visit (INDEPENDENT_AMBULATORY_CARE_PROVIDER_SITE_OTHER): Payer: BC Managed Care – PPO

## 2019-12-27 ENCOUNTER — Other Ambulatory Visit: Payer: Self-pay

## 2019-12-27 ENCOUNTER — Ambulatory Visit (HOSPITAL_COMMUNITY)
Admission: EM | Admit: 2019-12-27 | Discharge: 2019-12-27 | Disposition: A | Discharge status: Home / Self Care | Payer: BC Managed Care – PPO

## 2019-12-27 DIAGNOSIS — M546 Pain in thoracic spine: Secondary | ICD-10-CM

## 2019-12-27 DIAGNOSIS — M25511 Pain in right shoulder: Secondary | ICD-10-CM | POA: Diagnosis not present

## 2019-12-27 DIAGNOSIS — M5412 Radiculopathy, cervical region: Secondary | ICD-10-CM

## 2019-12-27 LAB — POCT URINALYSIS DIP (DEVICE)
Bilirubin Urine: NEGATIVE
Glucose, UA: NEGATIVE mg/dL
Ketones, ur: NEGATIVE mg/dL
Leukocytes,Ua: NEGATIVE
Nitrite: NEGATIVE
Protein, ur: NEGATIVE mg/dL
Specific Gravity, Urine: >=1.03 (ref 1.005–1.030)
Urobilinogen, UA: 0.2 mg/dL (ref 0.0–1.0)
pH: 5.5 (ref 5.0–8.0)

## 2019-12-27 MED ORDER — KETOROLAC TROMETHAMINE 60 MG/2ML IM SOLN
INTRAMUSCULAR | Status: AC
  Filled 2019-12-27: qty 2

## 2019-12-27 MED ORDER — KETOROLAC TROMETHAMINE 60 MG/2ML IM SOLN
60.0000 mg | Freq: Once | INTRAMUSCULAR | Status: AC
  Administered 2019-12-27: 60 mg via INTRAMUSCULAR

## 2019-12-27 MED ORDER — CYCLOBENZAPRINE HCL 10 MG PO TABS
10.0000 mg | ORAL_TABLET | Freq: Two times a day (BID) | ORAL | 0 refills | Status: DC | PRN
Start: 2019-12-27 — End: 2020-08-26

## 2019-12-27 MED ORDER — HYDROCODONE-ACETAMINOPHEN 5-325 MG PO TABS: 1.0000 | ORAL_TABLET | Freq: Four times a day (QID) | ORAL | 0 refills | Status: AC | PRN

## 2019-12-27 MED ORDER — PREDNISONE 10 MG (21) PO TBPK
ORAL_TABLET | ORAL | 0 refills | Status: DC
Start: 2019-12-27 — End: 2020-08-26

## 2019-12-27 NOTE — ED Triage Notes (Signed)
Pt c/o 8/10 dull stabbing pain in right side of mid back near kidney areax3-4 wks. Pt c/o urinary urgencyx1.5 wks. Pt states has numbness on 4-5 digit of left hand and constant nerve itch top of left shoulderx1 yr. Pt has been to orthopedist and neurologist for the numb fingers and shoulder. The issue is the back pain and urinary urgency. Pt was able to walk to triage room.

## 2019-12-27 NOTE — Telephone Encounter (Signed)
Patient calls with intermittent right-sided upper back pain that began 2-3 weeks ago. Feels like a knot behind the right breat when the pain occurs. Can last 10-45 minutes and is not related to movement/activity. 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 Denies CP. She is always SOB with wheezing prior to this beginning. Does not use her inhalers. Denies N/V. No cough/fever. Mild diarrhea recently and headaches everyday. Increase in urinary urgency since the back pain began. Has tried tylenol/ibuprofen/muscle relaxer/heat/massage. Saw Chiropractor for adjustment 3 days ago. Nothing helps. Thinks gall bladder may be causing this pain. Due to her symptoms an in-office visit cannot be done. Advised a hands on evaluation at UC/ED. Stated she would seek treatment today. She will call back if needed.   Reason for Disposition . [1] SEVERE back pain (e.g., excruciating, unable to do any normal activities) AND [2] not improved 2 hours after pain medicine  Answer Assessment - Initial Assessment Questions 1. ONSET: "When did the pain begin?"      2-3 weeks 2. LOCATION: "Where does it hurt?" (upper, mid or lower back)     Upper right area of the back-feels like a knot.a stabbing ache 3. SEVERITY: "How bad is the pain?"  (e.g., Scale 1-10; mild, moderate, or severe)   - MILD (1-3): doesn't interfere with normal activities    - MODERATE (4-7): interferes with normal activities or awakens from sleep    - SEVERE (8-10): excruciating pain, unable to do any normal activities      8 on the scale 4. PATTERN: "Is the pain constant?" (e.g., yes, no; constant, intermittent)      intermittent 5. RADIATION: "Does the pain shoot into your legs or elsewhere?"     From the back thru the chest to the sternum 6. CAUSE:  "What do you think is causing the back pain?"      No idea 7. BACK OVERUSE:  "Any recent lifting of heavy objects, strenuous work or exercise?"     no 8. MEDICATIONS:  "What have you taken so far for the pain?" (e.g., nothing, acetaminophen, NSAIDS)     Tried tylenol advil muscle relaxer, biofreeze 9. NEUROLOGIC SYMPTOMS: "Do you have any weakness, numbness, or problems with bowel/bladder control?"     Headache and wheezing. Trouble holding the urine. 10. OTHER SYMPTOMS: "Do you have any other symptoms?" (e.g., fever, abdominal pain, burning with urination, blood in urine)       no 11. PREGNANCY: "Is there any chance you are pregnant?" (e.g., yes, no; LMP)       no  Protocols used: BACK PAIN-A-AH

## 2019-12-27 NOTE — ED Provider Notes (Signed)
[facility]    CSN: [id number] Arrival date & time: 12/27/19  1406      History   Chief Complaint Chief Complaint  Patient presents with  . Back Pain    HPI Melinda Fox is a 44 y.o. female.   Pt is a 44 year old female that presents with right upper back pain. This has been constant over the past 3 to 4 weeks with intensity waxing and waning.  She describes it currently is at a 10, dull stabbing pain.  Describes as not in back.  Has also has some associated numbness, tingling to the fourth and fifth digit of the left hand and constant itching to the top of the left shoulder.  This is been a more ongoing issue over the past year.  The right upper back pain is more acute.  Has seen orthopedic specialist and neurologist for her symptoms.  She has had some mild urinary urgency but no dysuria or hematuria.  Denies any fever, nausea or vomiting.  Has been using her indomethacin for symptoms without any relief.  She is also had multiple massages without much relief either.  Denies any falls or injuries to the back.  ROS per HPI      Past Medical History:  Diagnosis Date  . Allergy   . Anemia   . Chronic migraine without aura without status migrainosus, not intractable   . Depression   . Elevated blood-pressure reading without diagnosis of hypertension   . Moderate concentric left ventricular hypertrophy 06/01/2017   Echocardiogram October 2018  . Morbid obesity with body mass index (BMI) of 60.0 to 69.9 in adult [facility]) 07/01/2015  . Scoliosis   . Sleep apnea, obstructive   . Thyroid disease   . Vasculitis [facility])     Patient Active Problem List   Diagnosis Date Noted  . Simple chronic bronchitis (HCC) 03/05/2019  . Paroxysmal tachycardia (HCC) 10/23/2018  . Moderate concentric left ventricular hypertrophy 06/01/2017  . Iron deficiency anemia 07/01/2015  . Migraine without aura and with status migrainosus, not intractable 07/01/2015  . Major depression,  recurrent, full remission (HCC) 07/01/2015  . OSA (obstructive sleep apnea) 07/01/2015  . Subclinical hypothyroidism 07/01/2015  . Allergic rhinitis 07/01/2015  . Lumbosacral disc disease 07/01/2015  . Sciatica neuralgia 07/01/2015  . Morbid obesity with body mass index (BMI) of 60.0 to 69.9 in adult [facility]) 07/01/2015    History reviewed. No pertinent surgical history.  OB History   No obstetric history on file.      Home Medications    Prior to Admission medications   Medication Sig Start Date End Date Taking? Authorizing Provider  albuterol (PROAIR HFA) 108 (90 Base) MCG/ACT inhaler Inhale 2 puffs into the lungs every 4 (four) hours as needed for wheezing or shortness of breath. 03/05/19   Poulose, Percell Belt, NP  budesonide-formoterol (SYMBICORT) 80-4.5 MCG/ACT inhaler Inhale 2 puffs into the lungs 2 (two) times daily. 03/05/19   Poulose, Percell Belt, NP  cyclobenzaprine (FLEXERIL) 10 MG tablet Take 1 tablet (10 mg total) by mouth 2 (two) times daily as needed for muscle spasms. Patient not taking: Reported on 12/28/2019 12/27/19   Dahlia Byes A, NP  gabapentin (NEURONTIN) 100 MG capsule Take 1 capsule (100 mg total) by mouth 3 (three) times daily. Patient not taking: Reported on 12/28/2019 06/06/19   Vickki Hearing, MD  HYDROcodone-acetaminophen (NORCO/VICODIN) 5-325 MG tablet Take 1-2 tablets by mouth every 6 (six) hours as needed for up to 3  days. 12/27/19 12/30/19  Loura Halt A, NP  hydrOXYzine (VISTARIL) 25 MG capsule 25 mg. 10/10/19   [provider]  indomethacin (INDOCIN) 50 MG capsule Take 1 capsule (50 mg total) by mouth 3 (three) times daily as needed. 05/08/19   Delsa Grana, [name]  levocetirizine (XYZAL) 5 MG tablet 5 mg. 10/18/19   [provider]  predniSONE (STERAPRED UNI-PAK 21 TAB) 10 MG (21) TBPK tablet 6 tabs for 1 day, then 5 tabs for 1 das, then 4 tabs for 1 day, then 3 tabs for 1 day, 2 tabs for 1 day, then 1 tab for 1 day Patient not taking:  Reported on 12/28/2019 12/27/19   Loura Halt A, NP  propranolol (INDERAL) 20 MG tablet Take 1 tablet (20 mg total) by mouth 3 (three) times daily as needed. 10/23/18   Minna Merritts, MD  traZODone (DESYREL) 50 MG tablet Take 0.5-1 tablets (25-50 mg total) by mouth at bedtime as needed for sleep. 08/31/19   Delsa Grana, [name]  venlafaxine XR (EFFEXOR XR) 75 MG 24 hr capsule Take 1 capsule (75 mg total) by mouth daily with breakfast. 06/05/19   Delsa Grana, [name]    Family History Family History  Adopted: Yes  Family history unknown: Yes    Social History Social History   Tobacco Use  . Smoking status: Never Smoker  . Smokeless tobacco: Never Used  Substance Use Topics  . Alcohol use: No    Alcohol/week: 0.0 standard drinks    Comment: once a year  . Drug use: No     Allergies   Sulfa antibiotics   Review of Systems Review of Systems   Physical Exam Triage Vital Signs ED Triage Vitals  Enc Vitals Group     BP 12/27/19 1433 (!) 157/95     Pulse Rate 12/27/19 1433 94     Resp 12/27/19 1433 20     Temp 12/27/19 1433 98.8 F (37.1 C)     Temp Source 12/27/19 1433 Oral     SpO2 12/27/19 1433 97 %     Weight 12/27/19 1439 (!) 455 lb (206.4 kg)     Height 12/27/19 1439 5\' 8"  (1.727 m)     Head Circumference --      Peak Flow --      Pain Score 12/27/19 1438 8     Pain Loc --      Pain Edu? --      Excl. in [place]? --    No data found.  Updated Vital Signs BP (!) 157/95   Pulse 94   Temp 98.8 F (37.1 C) (Oral)   Resp 20   Ht 5\' 8"  (1.727 m)   Wt (!) 455 lb (206.4 kg)   LMP 12/18/2019 (Exact Date)   SpO2 97%   BMI 69.18 kg/m   Visual Acuity Right Eye Distance:   Left Eye Distance:   Bilateral Distance:    Right Eye Near:   Left Eye Near:    Bilateral Near:     Physical Exam Musculoskeletal:     Cervical back: No tenderness. Normal range of motion.     Thoracic back: Tenderness present.       Back:     Comments: Tender to palpation.  No obvious  swelling or deformities.      UC Treatments / Results  Labs (all labs ordered are listed, but only abnormal results are displayed) Labs Reviewed  POCT URINALYSIS DIP (DEVICE) - Abnormal; Notable for the following  components:      Result Value   Hgb urine dipstick TRACE (*)    All other components within normal limits    EKG   Radiology DG Chest 2 View  Result Date: 12/28/2019 CLINICAL DATA:  Right-sided chest pain.  Shortness of breath. EXAM: CHEST - 2 VIEW COMPARISON:  None. FINDINGS: The heart size and mediastinal contours are within normal limits. Both lungs are clear. No pneumothorax or pleural effusion is noted. The visualized skeletal structures are unremarkable. IMPRESSION: No active cardiopulmonary disease. Electronically Signed   By: Lupita Raider M.D.   On: 12/28/2019 17:07   DG Cervical Spine Complete  Result Date: 12/27/2019 CLINICAL DATA:  Right-sided shoulder pain for several weeks, no known injury EXAM: CERVICAL SPINE - COMPLETE 4+ VIEW COMPARISON:  None. FINDINGS: Seven cervical segments are visualized. Vertebral body height is well maintained. No significant neural foraminal narrowing is noted. No soft tissue changes are seen. No acute fracture or acute facet abnormality is noted. The odontoid is within normal limits. IMPRESSION: No acute abnormality noted. Electronically Signed   By: Alcide Clever M.D.   On: 12/27/2019 16:09   DG Abd 1 View  Result Date: 12/28/2019 CLINICAL DATA:  Right flank pain. EXAM: ABDOMEN - 1 VIEW COMPARISON:  None. FINDINGS: The bowel gas pattern is normal. No radio-opaque calculi or other significant radiographic abnormality are seen. IMPRESSION: Negative. Electronically Signed   By: Lupita Raider M.D.   On: 12/28/2019 17:08    Procedures Procedures (including critical care time)  Medications Ordered in UC Medications  ketorolac (TORADOL) injection 60 mg (60 mg Intramuscular Given 12/27/19 1545)    Initial Impression / Assessment  and Plan / UC Course  I have reviewed the triage vital signs and the nursing notes.  Pertinent labs & imaging results that were available during my care of the patient were reviewed by me and considered in my medical decision making (see chart for details).     Acute right-sided thoracic back pain Likely related to muscle spasming Treating with Flexeril Patient not having any CVA tenderness She was concern for kidney infection.  Urine today with trace blood otherwise negative.  No concern for [place] at this time. Toradol given here for pain  Cervical radiculopathy Cervical x-ray not concerning today. Treating pain with prednisone taper over the next 6 days to help with nerve pain, inflammation. This should also help with her upper back pain.  Also giving hydrocodone to use as needed for more severe pain Patient has appointment to follow-up with her doctor tomorrow. Final Clinical Impressions(s) / UC Diagnoses   Final diagnoses:  Acute right-sided thoracic back pain  Cervical radiculopathy     Discharge Instructions     I believe this is muscle spasms and some nerve inflammation  I am treating you with prednisone taper over the next 6 days. Take with food.  I am also giving you a strong muscle relaxant. This may make you drowsy.  Hydrocodone as needed for severe pain If this problem persists I would follow up with your doctor or go to the ER for worsening symptoms.     ED Prescriptions    Medication Sig Dispense Auth. Provider   cyclobenzaprine (FLEXERIL) 10 MG tablet Take 1 tablet (10 mg total) by mouth 2 (two) times daily as needed for muscle spasms. Patient not taking:  Reported on 12/28/2019 20 tablet Tarique Loveall A, NP   predniSONE (STERAPRED UNI-PAK 21 TAB) 10 MG (21) TBPK tablet 6  tabs for 1 day, then 5 tabs for 1 das, then 4 tabs for 1 day, then 3 tabs for 1 day, 2 tabs for 1 day, then 1 tab for 1 day Patient not taking:  Reported on 12/28/2019 21 tablet Garnetta Fedrick A, NP    HYDROcodone-acetaminophen (NORCO/VICODIN) 5-325 MG tablet Take 1-2 tablets by mouth every 6 (six) hours as needed for up to 3 days. 12 tablet Kortny Lirette A, NP     I have reviewed the PDMP during this encounter.   Janace Aris, NP 12/29/19 1327

## 2019-12-27 NOTE — Discharge Instructions (Signed)
I believe this is muscle spasms and some nerve inflammation  I am treating you with prednisone taper over the next 6 days. Take with food.  I am also giving you a strong muscle relaxant. This may make you drowsy.  Hydrocodone as needed for severe pain If this problem persists I would follow up with your doctor or go to the ER for worsening symptoms.

## 2019-12-28 ENCOUNTER — Other Ambulatory Visit: Payer: Self-pay

## 2019-12-28 ENCOUNTER — Encounter: Payer: Self-pay | Admitting: Family Medicine

## 2019-12-28 ENCOUNTER — Ambulatory Visit: Payer: BC Managed Care – PPO | Admitting: Family Medicine

## 2019-12-28 ENCOUNTER — Ambulatory Visit
Admission: RE | Admit: 2019-12-28 | Discharge: 2019-12-28 | Disposition: A | Discharge status: Home / Self Care | Payer: BC Managed Care – PPO | Source: Ambulatory Visit | Attending: Family Medicine | Admitting: Family Medicine

## 2019-12-28 VITALS — BP 130/84 | HR 100 | Temp 97.1°F | Resp 18 | Ht 68.0 in | Wt >= 6400 oz

## 2019-12-28 DIAGNOSIS — R635 Abnormal weight gain: Secondary | ICD-10-CM | POA: Diagnosis not present

## 2019-12-28 DIAGNOSIS — R109 Unspecified abdominal pain: Secondary | ICD-10-CM | POA: Diagnosis not present

## 2019-12-28 NOTE — Progress Notes (Signed)
Patient ID: Melinda Fox, female    DOB: 1975-12-07, 44 y.o.   MRN: [id number]  PCP: Danelle Berry, [name]  Chief Complaint  Patient presents with  . Cyst    on back, painful for 3 weeks, seen at UC    Subjective:   Melinda Fox is a 44 y.o. female, presents to clinic with CC of the following:  HPI   Sore spot/knot to right back flank area Pain has been ongoing for 3-4 weeks, feels deep to flank area Sharp, like someone is digging there knuckle into that area, some pressure sensation,  Pain is fairly constant, somewhat worse with sitting No urinary sx She does feel like breathing a little tighter than her normal  She has been seen by UC,   She notes recent weight gain, dx with acute thoracic back pain and cervical radiculopathy and toradol injection, steroid taper, muscle relaxers and narcotic pain meds.   Patient did not note any improvement with the Toradol injection and she has not tried all the other medications yet She previously has done massage, has seen neurology and Ortho in the past for symptoms.  She noted to urgent care she had some mild urinary urgency but no pain or blood there is only trace amount of blood on the urine dip done yesterday, no culture sent off no abdominal pain except for shooting from her right flank area to her right rib right upper quadrant area is not worse with eating, deep breathing any positional changes  Wt Readings from Last 5 Encounters:  12/28/19 (!) 460 lb 12.8 oz (209 kg)  12/27/19 (!) 455 lb (206.4 kg)  10/10/19 (!) 432 lb (196 kg)  08/31/19 (!) 427 lb (193.7 kg)  08/15/19 (!) 427 lb (193.7 kg)   BMI Readings from Last 5 Encounters:  12/28/19 70.06 kg/m  12/27/19 69.18 kg/m  10/10/19 65.69 kg/m  08/31/19 64.93 kg/m  08/15/19 64.93 kg/m   Patient notes some tightness sensation in her chest but she denies any productive cough, wheeze, shortness of breath, fever, chills, sweats    Patient Active Problem List   Diagnosis Date Noted  . Simple chronic bronchitis (HCC) 03/05/2019  . Paroxysmal tachycardia (HCC) 10/23/2018  . Moderate concentric left ventricular hypertrophy 06/01/2017  . Iron deficiency anemia 07/01/2015  . Migraine without aura and with status migrainosus, not intractable 07/01/2015  . Major depression, recurrent, full remission (HCC) 07/01/2015  . OSA (obstructive sleep apnea) 07/01/2015  . Subclinical hypothyroidism 07/01/2015  . Allergic rhinitis 07/01/2015  . Lumbosacral disc disease 07/01/2015  . Sciatica neuralgia 07/01/2015  . Morbid obesity with body mass index (BMI) of 60.0 to 69.9 in adult [facility]) 07/01/2015      Current Outpatient Medications:  .  albuterol (PROAIR HFA) 108 (90 Base) MCG/ACT inhaler, Inhale 2 puffs into the lungs every 4 (four) hours as needed for wheezing or shortness of breath., Disp: 18 g, Rfl: 2 .  budesonide-formoterol (SYMBICORT) 80-4.5 MCG/ACT inhaler, Inhale 2 puffs into the lungs 2 (two) times daily., Disp: 1 Inhaler, Rfl: 11 .  HYDROcodone-acetaminophen (NORCO/VICODIN) 5-325 MG tablet, Take 1-2 tablets by mouth every 6 (six) hours as needed for up to 3 days., Disp: 12 tablet, Rfl: 0 .  hydrOXYzine (VISTARIL) 25 MG capsule, 25 mg., Disp: , Rfl:  .  indomethacin (INDOCIN) 50 MG capsule, Take 1 capsule (50 mg total) by mouth 3 (three) times daily as needed., Disp: 40 capsule, Rfl: 0 .  levocetirizine (XYZAL) 5 MG tablet, 5 mg., Disp: ,  Rfl:  .  propranolol (INDERAL) 20 MG tablet, Take 1 tablet (20 mg total) by mouth 3 (three) times daily as needed., Disp: 90 tablet, Rfl: 3 .  traZODone (DESYREL) 50 MG tablet, Take 0.5-1 tablets (25-50 mg total) by mouth at bedtime as needed for sleep., Disp: 30 tablet, Rfl: 3 .  venlafaxine XR (EFFEXOR XR) 75 MG 24 hr capsule, Take 1 capsule (75 mg total) by mouth daily with breakfast., Disp: 90 capsule, Rfl: 1 .  cyclobenzaprine (FLEXERIL) 10 MG tablet, Take 1 tablet (10 mg total) by mouth 2 (two) times daily as  needed for muscle spasms. (Patient not taking: Reported on 12/28/2019), Disp: 20 tablet, Rfl: 0 .  gabapentin (NEURONTIN) 100 MG capsule, Take 1 capsule (100 mg total) by mouth 3 (three) times daily. (Patient not taking: Reported on 12/28/2019), Disp: 90 capsule, Rfl: 2 .  predniSONE (STERAPRED UNI-PAK 21 TAB) 10 MG (21) TBPK tablet, 6 tabs for 1 day, then 5 tabs for 1 das, then 4 tabs for 1 day, then 3 tabs for 1 day, 2 tabs for 1 day, then 1 tab for 1 day (Patient not taking: Reported on 12/28/2019), Disp: 21 tablet, Rfl: 0   Allergies  Allergen Reactions  . Sulfa Antibiotics Other (See Comments)    Was told that she had it as a child     Family History  Adopted: Yes  Family history unknown: Yes     Social History   Socioeconomic History  . Marital status: Single    Spouse name: Not on file  . Number of children: Not on file  . Years of education: Not on file  . Highest education level: Not on file  Occupational History  . Not on file  Tobacco Use  . Smoking status: Never Smoker  . Smokeless tobacco: Never Used  Substance and Sexual Activity  . Alcohol use: No    Alcohol/week: 0.0 standard drinks    Comment: once a year  . Drug use: No  . Sexual activity: Never  Other Topics Concern  . Not on file  Social History Narrative  . Not on file   Social Determinants of Health   Financial Resource Strain:   . Difficulty of Paying Living Expenses:   Food Insecurity:   . Worried About Charity fundraiser in the Last Year:   . Arboriculturist in the Last Year:   Transportation Needs:   . Film/video editor (Medical):   Marland Kitchen Lack of Transportation (Non-Medical):   Physical Activity:   . Days of Exercise per Week:   . Minutes of Exercise per Session:   Stress:   . Feeling of Stress :   Social Connections:   . Frequency of Communication with Friends and Family:   . Frequency of Social Gatherings with Friends and Family:   . Attends Religious Services:   . Active Member  of Clubs or Organizations:   . Attends Archivist Meetings:   Marland Kitchen Marital Status:   Intimate Partner Violence:   . Fear of Current or Ex-Partner:   . Emotionally Abused:   Marland Kitchen Physically Abused:   . Sexually Abused:     Chart Review Today: I personally reviewed active problem list, medication list, allergies, family history, social history, health maintenance, notes from last encounter, lab results, imaging with the patient/caregiver today.   Review of Systems 10 Systems reviewed and are negative for acute change except as noted in the HPI.     Objective:  Vitals:   12/28/19 1528  BP: 130/84  Pulse: 100  Resp: 18  Temp: (!) 97.1 F (36.2 C)  TempSrc: Temporal  SpO2: 98%  Weight: (!) 460 lb 12.8 oz (209 kg)  Height: 5\' 8"  (1.727 m)    Body mass index is 70.06 kg/m.  Physical Exam Vitals and nursing note reviewed.  Constitutional:      General: She is not in acute distress.    Appearance: She is obese. She is not ill-appearing, toxic-appearing or diaphoretic.  Cardiovascular:     Rate and Rhythm: Regular rhythm. Tachycardia present.     Pulses: Normal pulses.     Heart sounds: Normal heart sounds.  Pulmonary:     Effort: Pulmonary effort is normal.     Breath sounds: Normal breath sounds.  Chest:     Chest wall: No tenderness.  Abdominal:     General: Bowel sounds are normal. There is no distension.     Palpations: Abdomen is soft.     Tenderness: There is no abdominal tenderness. There is no right CVA tenderness or left CVA tenderness.  Neurological:     Mental Status: She is alert.  Psychiatric:        Mood and Affect: Mood normal.        Behavior: Behavior normal.   Exam limited by body habitus  Results for orders placed or performed during the hospital encounter of 12/27/19  POCT urinalysis dip (device)  Result Value Ref Range   Glucose, UA NEGATIVE NEGATIVE mg/dL   Bilirubin Urine NEGATIVE NEGATIVE   Ketones, ur NEGATIVE NEGATIVE mg/dL    Specific Gravity, Urine >=1.030 1.005 - 1.030   Hgb urine dipstick TRACE (A) NEGATIVE   pH 5.5 5.0 - 8.0   Protein, ur NEGATIVE NEGATIVE mg/dL   Urobilinogen, UA 0.2 0.0 - 1.0 mg/dL   Nitrite NEGATIVE NEGATIVE   Leukocytes,Ua NEGATIVE NEGATIVE        Assessment & Plan:      ICD-10-CM   1. Right flank pain  R10.9 DG Chest 2 View    DG Abd 1 View    CANCELED: DG Chest 2 View    CANCELED: DG Abd 1 View   Unclear etiology no real urinary symptoms the pain is located to right back side and does seem to radiate through lower chest or upper abdomen  2. Abnormal weight gain  R63.5 Amb Referral to Bariatric Surgery  3. Morbid obesity (HCC)  E66.01 Amb Referral to Bariatric Surgery   BMI greater than 70 with 30 pounds weight gain in the past 6 months    Plan for pain is to get x-ray imaging rule out any right lower lobe pneumonia or any kidney stone, could possibly be cholelithiasis or cholestasis?  Although no worsening with eating? May be muscle skeletal the patient does note a radiation through her body from right lower back flank pain area to right upper quadrant right lower anterior rib area and notes some tightness in her chest though she does not have any wheeze or shortness of breath.  Will screen with chest x-ray two-view and KUB CT renal stone study would evaluate best for unclear pathology but would be expensive and I cannot reproduce her pain on exam-so we will hold on any CT studies today she states that she cannot do them today anyways Possibly could get a right upper quadrant ultrasound ordered?  Body habitus may limit result quality?  Could be used to rule out a gallstone but would be  unhelpful/difficult for ruling out any renal pathology?  She is given a Toradol shot yesterday which did not change her pain at all-with muscle skeletal pain I would expect to see some improvement with Toradol, she has not yet tried it the steroid taper muscle relaxers and she was given narcotic pain  medicine as well.  Steroids would help with any wheeze or bronchitis causing irritation would help with any pleurisy causing her pain or treat any musculoskeletal or radicular symptoms have encouraged her to try the steroids muscle relaxers and other medications given by urgent care and see if it helps her pain at all.  She was previously given gabapentin for similar symptoms in the past but she is not currently taking them.    Danelle Berry, [name] 12/28/19 3:46 PM

## 2020-01-01 ENCOUNTER — Other Ambulatory Visit: Payer: Self-pay | Admitting: Family Medicine

## 2020-01-01 ENCOUNTER — Encounter: Payer: Self-pay | Admitting: Family Medicine

## 2020-01-01 DIAGNOSIS — F331 Major depressive disorder, recurrent, moderate: Secondary | ICD-10-CM

## 2020-01-01 DIAGNOSIS — R519 Headache, unspecified: Secondary | ICD-10-CM

## 2020-01-01 DIAGNOSIS — R1010 Upper abdominal pain, unspecified: Secondary | ICD-10-CM

## 2020-01-01 MED ORDER — INDOMETHACIN 50 MG PO CAPS: 50.0000 mg | ORAL_CAPSULE | Freq: Three times a day (TID) | ORAL | 0 refills | Status: DC | PRN

## 2020-01-02 MED ORDER — HYDROXYZINE PAMOATE 25 MG PO CAPS: 25.0000 mg | ORAL_CAPSULE | Freq: Three times a day (TID) | ORAL | 2 refills | Status: DC | PRN

## 2020-01-02 MED ORDER — VENLAFAXINE HCL ER 75 MG PO CP24: 75.0000 mg | ORAL_CAPSULE | Freq: Every day | ORAL | 3 refills | Status: DC

## 2020-01-04 ENCOUNTER — Other Ambulatory Visit: Payer: Self-pay

## 2020-01-04 ENCOUNTER — Ambulatory Visit
Admission: RE | Admit: 2020-01-04 | Discharge: 2020-01-04 | Disposition: A | Discharge status: Home / Self Care | Payer: BC Managed Care – PPO | Source: Ambulatory Visit | Attending: Family Medicine | Admitting: Family Medicine

## 2020-01-04 DIAGNOSIS — R1010 Upper abdominal pain, unspecified: Secondary | ICD-10-CM | POA: Diagnosis not present

## 2020-01-29 DIAGNOSIS — R200 Anesthesia of skin: Secondary | ICD-10-CM | POA: Insufficient documentation

## 2020-01-29 DIAGNOSIS — R29898 Other symptoms and signs involving the musculoskeletal system: Secondary | ICD-10-CM | POA: Insufficient documentation

## 2020-03-13 DIAGNOSIS — G5622 Lesion of ulnar nerve, left upper limb: Secondary | ICD-10-CM | POA: Insufficient documentation

## 2020-03-13 DIAGNOSIS — G5602 Carpal tunnel syndrome, left upper limb: Secondary | ICD-10-CM | POA: Insufficient documentation

## 2020-03-18 DIAGNOSIS — R937 Abnormal findings on diagnostic imaging of other parts of musculoskeletal system: Secondary | ICD-10-CM | POA: Insufficient documentation

## 2020-04-02 ENCOUNTER — Encounter: Payer: Self-pay | Admitting: Family Medicine

## 2020-04-04 ENCOUNTER — Telehealth: Payer: BC Managed Care – PPO | Admitting: Physician Assistant

## 2020-04-04 DIAGNOSIS — M545 Low back pain, unspecified: Secondary | ICD-10-CM

## 2020-04-04 MED ORDER — BACLOFEN 10 MG PO TABS: 10.0000 mg | ORAL_TABLET | Freq: Three times a day (TID) | ORAL | 0 refills | Status: DC

## 2020-04-04 MED ORDER — ETODOLAC 300 MG PO CAPS: 300.0000 mg | ORAL_CAPSULE | Freq: Three times a day (TID) | ORAL | 0 refills | Status: AC

## 2020-04-04 NOTE — Progress Notes (Signed)
Hi Melinda Fox,   I am sorry you are not feeling well.  Back pain can be difficulty to treat and is often a lifelong condition.  Please be sure to follow-up with Edwena Felty, MD or your PCP for this problem if it fails to improve with the following treatment plan.  We are sorry that you are not feeling well.  Here is how we plan to help!  Based on what you have shared with me it looks like you mostly have acute back pain.  Acute back pain is defined as musculoskeletal pain that can resolve in 1-3 weeks with conservative treatment.  I have prescribed Etodolac 300 mg take one by mouth twice a day non-steroid anti-inflammatory (NSAID) as well as Baclofen 10 mg every eight hours as needed which is a muscle relaxer  Some patients experience stomach irritation or in increased heartburn with anti-inflammatory drugs.  Please keep in mind that muscle relaxer's can cause fatigue and should not be taken while at work or driving.  Back pain is very common.  The pain often gets better over time.  The cause of back pain is usually not dangerous.  Most people can learn to manage their back pain on their own.  Home Care  Stay active.  Start with short walks on flat ground if you can.  Try to walk farther each day.  Do not sit, drive or stand in one place for more than 30 minutes.  Do not stay in bed.  Do not avoid exercise or work.  Activity can help your back heal faster.  Be careful when you bend or lift an object.  Bend at your knees, keep the object close to you, and do not twist.  Sleep on a firm mattress.  Lie on your side, and bend your knees.  If you lie on your back, put a pillow under your knees.  Only take medicines as told by your doctor.  Put ice on the injured area.  Put ice in a plastic bag  Place a towel between your skin and the bag  Leave the ice on for 15-20 minutes, 3-4 times a day for the first 2-3 days. 210 After that, you can switch between ice and heat packs.  Ask your doctor  about back exercises or massage.  Avoid feeling anxious or stressed.  Find good ways to deal with stress, such as exercise.  Get Help Right Way If:  Your pain does not go away with rest or medicine.  Your pain does not go away in 1 week.  You have new problems.  You do not feel well.  The pain spreads into your legs.  You cannot control when you poop (bowel movement) or pee (urinate)  You feel sick to your stomach (nauseous) or throw up (vomit)  You have belly (abdominal) pain.  You feel like you may pass out (faint).  If you develop a fever.  Make Sure you:  Understand these instructions.  Will watch your condition  Will get help right away if you are not doing well or get worse.  Your e-visit answers were reviewed by a board certified advanced clinical practitioner to complete your personal care plan.  Depending on the condition, your plan could have included both over the counter or prescription medications.  If there is a problem please reply  once you have received a response from your provider.  Your safety is important to [place].  If you have drug allergies check your prescription carefully.  You can use MyChart to ask questions about today's visit, request a non-urgent call back, or ask for a work or school excuse for 24 hours related to this e-Visit. If it has been greater than 24 hours you will need to follow up with your provider, or enter a new e-Visit to address those concerns.  You will get an e-mail in the next two days asking about your experience.  I hope that your e-visit has been valuable and will speed your recovery. Thank you for using e-visits.  Greater than 5 minutes, yet less than 10 minutes of time have been spent researching, coordinating and implementing care for this patient today.

## 2020-05-09 ENCOUNTER — Telehealth: Payer: BC Managed Care – PPO | Admitting: Family Medicine

## 2020-07-30 ENCOUNTER — Encounter: Payer: Self-pay | Admitting: Family Medicine

## 2020-08-01 ENCOUNTER — Telehealth: Payer: BC Managed Care – PPO | Admitting: Family Medicine

## 2020-08-26 ENCOUNTER — Telehealth (INDEPENDENT_AMBULATORY_CARE_PROVIDER_SITE_OTHER): Payer: BC Managed Care – PPO | Admitting: Family Medicine

## 2020-08-26 ENCOUNTER — Encounter: Payer: Self-pay | Admitting: Family Medicine

## 2020-08-26 ENCOUNTER — Other Ambulatory Visit: Payer: Self-pay

## 2020-08-26 DIAGNOSIS — F331 Major depressive disorder, recurrent, moderate: Secondary | ICD-10-CM

## 2020-08-26 DIAGNOSIS — J410 Simple chronic bronchitis: Secondary | ICD-10-CM | POA: Diagnosis not present

## 2020-08-26 DIAGNOSIS — F419 Anxiety disorder, unspecified: Secondary | ICD-10-CM | POA: Diagnosis not present

## 2020-08-26 MED ORDER — ALBUTEROL SULFATE HFA 108 (90 BASE) MCG/ACT IN AERS: 2.0000 | INHALATION_SPRAY | RESPIRATORY_TRACT | 2 refills | Status: DC | PRN

## 2020-08-26 MED ORDER — VENLAFAXINE HCL ER 37.5 MG PO CP24: 37.5000 mg | ORAL_CAPSULE | Freq: Every day | ORAL | 0 refills | Status: DC

## 2020-08-26 MED ORDER — HYDROXYZINE PAMOATE 25 MG PO CAPS: 25.0000 mg | ORAL_CAPSULE | Freq: Three times a day (TID) | ORAL | 2 refills | Status: DC | PRN

## 2020-08-26 MED ORDER — BUDESONIDE-FORMOTEROL FUMARATE 80-4.5 MCG/ACT IN AERO: 2.0000 | INHALATION_SPRAY | Freq: Two times a day (BID) | RESPIRATORY_TRACT | 11 refills | Status: DC

## 2020-08-26 NOTE — Progress Notes (Addendum)
Name: Melinda Fox   MRN: [id number]    DOB: Jul 30, 1976   Date:08/26/2020       Progress Note  Subjective:    Chief Complaint  Chief Complaint  Patient presents with  . Medication Problem    I connected with  [name]  on 08/26/20 at 10:20 AM EST by a video enabled telemedicine application and verified that I am speaking with the correct person using two identifiers.  I discussed the limitations of evaluation and management by telemedicine and the availability of in person appointments. The patient expressed understanding and agreed to proceed. Staff also discussed with the patient that there may be a patient responsible charge related to this service.  Patient Location: home Provider Location: [facility] Additional Individuals present: none  HPI KC refused to do anything with CMA today - would not tell her what the visit was for, would not answer any screening questions about COVID exposure/travel, would not review med list. Pt states this is not what happened today  Wants to talk about venlafaxine and possible dose increase, she did take herself off of it during the summer and restarted 75 mg capsule about a month ago.      Depression screen [facility] 2/9 12/28/2019 10/10/2019 08/31/2019  Decreased Interest 2 1 0  Down, Depressed, Hopeless 2 0 3  PHQ - 2 Score 4 1 3   Altered sleeping 3 2 3   Tired, decreased energy 3 3 3   Change in appetite 2 1 3   Feeling bad or failure about yourself  1 0 1  Trouble concentrating 1 3 3   Moving slowly or fidgety/restless 1 2 0  Suicidal thoughts 0 0 1  PHQ-9 Score 15 12 17   Difficult doing work/chores Somewhat difficult Very difficult Somewhat difficult  Some recent data might be hidden  continued significant work stress "School is obnoxious", she restarted meds in the past month it did help with her mood She avoids some of the stuff at work that bothers her She restarted therapy        Patient Active Problem List    Diagnosis Date Noted  . Simple chronic bronchitis (HCC) 03/05/2019  . Paroxysmal tachycardia (HCC) 10/23/2018  . Moderate concentric left ventricular hypertrophy 06/01/2017  . Iron deficiency anemia 07/01/2015  . Migraine without aura and with status migrainosus, not intractable 07/01/2015  . Major depression, recurrent, full remission (HCC) 07/01/2015  . OSA (obstructive sleep apnea) 07/01/2015  . Subclinical hypothyroidism 07/01/2015  . Allergic rhinitis 07/01/2015  . Lumbosacral disc disease 07/01/2015  . Sciatica neuralgia 07/01/2015  . Morbid obesity with body mass index (BMI) of 60.0 to 69.9 in adult [facility]) 07/01/2015    Social History   Tobacco Use  . Smoking status: Never Smoker  . Smokeless tobacco: Never Used  Substance Use Topics  . Alcohol use: No    Alcohol/week: 0.0 standard drinks    Comment: once a year     Current Outpatient Medications:  .  albuterol (PROAIR HFA) 108 (90 Base) MCG/ACT inhaler, Inhale 2 puffs into the lungs every 4 (four) hours as needed for wheezing or shortness of breath., Disp: 18 g, Rfl: 2 .  baclofen (LIORESAL) 10 MG tablet, Take 1 tablet (10 mg total) by mouth 3 (three) times daily., Disp: 30 each, Rfl: 0 .  budesonide-formoterol (SYMBICORT) 80-4.5 MCG/ACT inhaler, Inhale 2 puffs into the lungs 2 (two) times daily., Disp: 1 Inhaler, Rfl: 11 .  hydrOXYzine (VISTARIL) 25 MG capsule, Take 1 capsule (25 mg total)  by mouth every 8 (eight) hours as needed for anxiety., Disp: 90 capsule, Rfl: 2 .  indomethacin (INDOCIN) 50 MG capsule, Take 1 capsule (50 mg total) by mouth 3 (three) times daily as needed., Disp: 40 capsule, Rfl: 0 .  levocetirizine (XYZAL) 5 MG tablet, 5 mg., Disp: , Rfl:  .  propranolol (INDERAL) 20 MG tablet, Take 1 tablet (20 mg total) by mouth 3 (three) times daily as needed., Disp: 90 tablet, Rfl: 3 .  traZODone (DESYREL) 50 MG tablet, Take 0.5-1 tablets (25-50 mg total) by mouth at bedtime as needed for sleep., Disp: 30 tablet,  Rfl: 3 .  venlafaxine XR (EFFEXOR XR) 75 MG 24 hr capsule, Take 1 capsule (75 mg total) by mouth daily with breakfast., Disp: 90 capsule, Rfl: 3 .  cyclobenzaprine (FLEXERIL) 10 MG tablet, Take 1 tablet (10 mg total) by mouth 2 (two) times daily as needed for muscle spasms. (Patient not taking: No sig reported), Disp: 20 tablet, Rfl: 0 .  gabapentin (NEURONTIN) 100 MG capsule, Take 1 capsule (100 mg total) by mouth 3 (three) times daily. (Patient not taking: No sig reported), Disp: 90 capsule, Rfl: 2 .  predniSONE (STERAPRED UNI-PAK 21 TAB) 10 MG (21) TBPK tablet, 6 tabs for 1 day, then 5 tabs for 1 das, then 4 tabs for 1 day, then 3 tabs for 1 day, 2 tabs for 1 day, then 1 tab for 1 day (Patient not taking: No sig reported), Disp: 21 tablet, Rfl: 0  Allergies  Allergen Reactions  . Sulfa Antibiotics Other (See Comments)    Was told that she had it as a child    I have reviewed the patient's medical history in detail and updated the computerized patient record.   Review of Systems  10 Systems reviewed and are negative for acute change except as noted in the HPI.   Objective:   Virtual encounter, vitals limited, only able to obtain the following There were no vitals filed for this visit. There is no height or weight on file to calculate BMI. Nursing Note and Vital Signs reviewed.  Physical Exam Vitals and nursing note reviewed.  Constitutional:      Appearance: She is obese.  Neurological:     Mental Status: She is alert.  Psychiatric:        Mood and Affect: Mood normal.        Behavior: Behavior normal.     PE limited by virtual VIDEO encounter  No results found for this or any previous visit (from the past 72 hour(s)).  Assessment and Plan:     ICD-10-CM   1. Anxiety disorder, unspecified type  F41.9    sx worsening, working with therapist - trial of effexor dose increase - pt to f/up in 1-2 months to let me know what dose is working for her for next refills  2.  Moderate episode of recurrent major depressive disorder (HCC)  F33.1    see above  3. Simple chronic bronchitis (HCC)  J41.0 budesonide-formoterol (SYMBICORT) 80-4.5 MCG/ACT inhaler    albuterol (PROAIR HFA) 108 (90 Base) MCG/ACT inhaler     -Red flags and when to present for emergency care or RTC including fever >101.57F, chest pain, shortness of breath, new/worsening/un-resolving symptoms, reviewed with patient at time of visit. Follow up and care instructions discussed and provided in AVS. - I discussed the assessment and treatment plan with the patient. The patient was provided an opportunity to ask questions and all were answered. The patient  agreed with the plan and demonstrated an understanding of the instructions.  I provided 20+ minutes of non-face-to-face time during this encounter.  Danelle Berry, [name] 08/26/20 10:34 AM

## 2020-10-12 ENCOUNTER — Other Ambulatory Visit: Payer: Self-pay

## 2020-10-12 ENCOUNTER — Ambulatory Visit
Admission: EM | Admit: 2020-10-12 | Discharge: 2020-10-12 | Disposition: A | Discharge status: Home / Self Care | Payer: BC Managed Care – PPO | Attending: Emergency Medicine | Admitting: Emergency Medicine

## 2020-10-12 ENCOUNTER — Encounter: Payer: Self-pay | Admitting: Emergency Medicine

## 2020-10-12 DIAGNOSIS — J069 Acute upper respiratory infection, unspecified: Secondary | ICD-10-CM | POA: Diagnosis not present

## 2020-10-12 DIAGNOSIS — Z1152 Encounter for screening for COVID-19: Secondary | ICD-10-CM

## 2020-10-12 MED ORDER — AMOXICILLIN 500 MG PO CAPS
500.0000 mg | ORAL_CAPSULE | Freq: Three times a day (TID) | ORAL | 0 refills | Status: DC
Start: 2020-10-12 — End: 2021-08-27

## 2020-10-12 MED ORDER — BENZONATATE 100 MG PO CAPS
100.0000 mg | ORAL_CAPSULE | Freq: Three times a day (TID) | ORAL | 0 refills | Status: DC
Start: 2020-10-12 — End: 2021-01-05

## 2020-10-12 NOTE — Discharge Instructions (Addendum)
COVID testing ordered.  It will take between 2-7 days for test results.  Someone will contact you regarding abnormal results.    Get plenty of rest and push fluids Tessalon Perles prescribed for cough Amoxicillin was prescribed Continue to use albuterol as prescribed and directed Use medications daily for symptom relief Use OTC medications like ibuprofen or tylenol as needed fever or pain Call or go to the ED if you have any new or worsening symptoms such as fever, worsening cough, shortness of

## 2020-10-12 NOTE — ED Provider Notes (Signed)
[facility]   [id number] 10/12/20 Arrival Time: 1255   CC: Congestion headache and fatigue  SUBJECTIVE: History from: patient.  Melinda Fox is a 45 y.o. female presented to urgent care for complaint of nasal congestion with yellow nasal discharge, cough, sinus pressure, headache and fatigue for past few days.  Denies sick exposure to COVID, flu or strep.  Denies recent travel.  Has tried OTC medication without relief.  Denies alleviating or aggravating factors.  Denies previous symptoms in the past.   Denies fever, chills, fatigue, sinus pain, rhinorrhea, sore throat, SOB, wheezing, chest pain, nausea, changes in bowel or bladder habits.    ROS: As per HPI.  All other pertinent ROS negative.     Past Medical History:  Diagnosis Date  . Allergy   . Anemia   . Chronic migraine without aura without status migrainosus, not intractable   . Depression   . Elevated blood-pressure reading without diagnosis of hypertension   . Moderate concentric left ventricular hypertrophy 06/01/2017   Echocardiogram October 2018  . Morbid obesity with body mass index (BMI) of 60.0 to 69.9 in adult [facility]) 07/01/2015  . Scoliosis   . Sleep apnea, obstructive   . Thyroid disease   . Vasculitis (HCC)    No past surgical history on file. Allergies  Allergen Reactions  . Sulfa Antibiotics Other (See Comments)    Was told that she had it as a child   No current facility-administered medications on file prior to encounter.   Current Outpatient Medications on File Prior to Encounter  Medication Sig Dispense Refill  . albuterol (PROAIR HFA) 108 (90 Base) MCG/ACT inhaler Inhale 2 puffs into the lungs every 4 (four) hours as needed for wheezing or shortness of breath. 18 g 2  . budesonide-formoterol (SYMBICORT) 80-4.5 MCG/ACT inhaler Inhale 2 puffs into the lungs 2 (two) times daily. 1 each 11  . hydrOXYzine (VISTARIL) 25 MG capsule Take 1 capsule (25 mg total) by mouth every 8 (eight) hours  as needed for anxiety. 90 capsule 2  . indomethacin (INDOCIN) 50 MG capsule Take 1 capsule (50 mg total) by mouth 3 (three) times daily as needed. 40 capsule 0  . venlafaxine XR (EFFEXOR XR) 75 MG 24 hr capsule Take 1 capsule (75 mg total) by mouth daily with breakfast. 90 capsule 3  . venlafaxine XR (EFFEXOR-XR) 37.5 MG 24 hr capsule Take 1 capsule (37.5 mg total) by mouth daily with breakfast. Add 37.5 mg to 75 mg capsule 60 capsule 0   Social History   Socioeconomic History  . Marital status: Single    Spouse name: Not on file  . Number of children: Not on file  . Years of education: Not on file  . Highest education level: Not on file  Occupational History  . Not on file  Tobacco Use  . Smoking status: Never Smoker  . Smokeless tobacco: Never Used  Vaping Use  . Vaping Use: Never used  Substance and Sexual Activity  . Alcohol use: No    Alcohol/week: 0.0 standard drinks    Comment: once a year  . Drug use: No  . Sexual activity: Never  Other Topics Concern  . Not on file  Social History Narrative  . Not on file   Social Determinants of Health   Financial Resource Strain: Not on file  Food Insecurity: Not on file  Transportation Needs: Not on file  Physical Activity: Not on file  Stress: Not on file  Social Connections: Not  on file  Intimate Partner Violence: Not on file   Family History  Adopted: Yes  Family history unknown: Yes    OBJECTIVE:  Vitals:   10/12/20 1304  BP: 139/77  Pulse: 77  Resp: 18  Temp: 98.1 F (36.7 C)  TempSrc: Oral  SpO2: 95%     General appearance: alert; appears fatigued, but nontoxic; speaking in full sentences and tolerating own secretions HEENT: NCAT; Ears: EACs clear, TMs pearly gray; Eyes: PERRL.  EOM grossly intact. Sinuses: nontender; Nose: nares patent without rhinorrhea, Throat: oropharynx clear, tonsils non erythematous or enlarged, uvula midline  Neck: supple without LAD Lungs: unlabored respirations, symmetrical air  entry; cough: moderate; no respiratory distress; CTAB Heart: regular rate and rhythm.  Radial pulses 2+ symmetrical bilaterally Skin: warm and dry Psychological: alert and cooperative; normal mood and affect  LABS:  No results found for this or any previous visit (from the past 24 hour(s)).   ASSESSMENT & PLAN:  1. URI with cough and congestion   2. Encounter for screening for COVID-19     Meds ordered this encounter  Medications  . amoxicillin (AMOXIL) 500 MG capsule    Sig: Take 1 capsule (500 mg total) by mouth 3 (three) times daily.    Dispense:  21 capsule    Refill:  0  . benzonatate (TESSALON) 100 MG capsule    Sig: Take 1 capsule (100 mg total) by mouth every 8 (eight) hours.    Dispense:  21 capsule    Refill:  0   Patient is stable at discharge.  In my opinion her symptoms are more likely viral in origin and doesn't require an antibiotic prescription.  She states she want amoxicillin to be prescribed as she had this symptom every year.  Amoxicillin was then prescribed  Discharge Instructions.Marland Kitchen    COVID testing ordered.  It will take between 2-7 days for test results.  Someone will contact you regarding abnormal results.    Get plenty of rest and push fluids Tessalon Perles prescribed for cough Amoxicillin was prescribed for possible bronchitis Continue to use albuterol as prescribed and directed Use medications daily for symptom relief Use OTC medications like ibuprofen or tylenol as needed fever or pain Call or go to the ED if you have any new or worsening symptoms such as fever, worsening cough, shortness of breath, chest tightness, chest pain, turning blue, changes in mental status, etc...   Reviewed expectations re: course of current medical issues. Questions answered. Outlined signs and symptoms indicating need for more acute intervention. Patient verbalized understanding. After Visit Summary given.         Durward Parcel, FNP 10/12/20 1330

## 2020-10-12 NOTE — ED Triage Notes (Signed)
Congestion, headache, fatigue.  Pt states she has bronchitis.

## 2020-10-13 LAB — SARS-COV-2, NAA 2 DAY TAT

## 2020-10-13 LAB — NOVEL CORONAVIRUS, NAA: SARS-CoV-2, NAA: NOT DETECTED

## 2020-11-13 ENCOUNTER — Encounter: Payer: Self-pay | Admitting: Family Medicine

## 2020-12-08 ENCOUNTER — Ambulatory Visit: Payer: BC Managed Care – PPO | Admitting: Internal Medicine

## 2020-12-08 NOTE — Patient Instructions (Signed)
No

## 2020-12-08 NOTE — Progress Notes (Signed)
Pt canceled her appointment. Office staff will reach out to reschedule.  

## 2021-01-02 NOTE — Progress Notes (Signed)
[facility] [address] [place], [place] [address]  Pulmonary Sleep Medicine   Office Visit Note  Patient Name: Melinda Fox DOB: 1976-05-21 MRN [id number]    Chief Complaint: Obstructive Sleep Apnea visit  Brief History:  Melinda Fox is seen today for initial sleep consult after set up CPAP @ 18 cmH2O.  The patient has a 9 month history of sleep apnea, but therapy was initiated about 3 months ago.  She states she previously had therapy, but returned it due to discomfort. Patient is using PAP nightly.  The patient feels no improvement after sleeping with PAP.  The patient reports not benefitting from PAP use due to . Reported sleepiness is  8 and the Epworth Sleepiness Score is 8 out of 24. The patient seldom take naps. The patient complains of the following: not feeling any better after therapy, Bedtime 1030 - 11pm  - wake up 630 - 7 am, later on week ends  The compliance download shows  compliance with an average use time of 6:14 hours. The AHI is 1.9  The patient  of limb movements disrupting sleep.  ROS  General: (-) fever, (-) chills, (-) night sweat Nose and Sinuses: (-) nasal stuffiness or itchiness, (-) postnasal drip, (-) nosebleeds, (-) sinus trouble. Mouth and Throat: (-) sore throat, (-) hoarseness. Neck: (-) swollen glands, (-) enlarged thyroid, (-) neck pain. Respiratory: - cough, - shortness of breath, - wheezing. Neurologic: + numbness, -+tingling.known neuropathy which is stable.  Psychiatric: + anxiety, + depression   Current Medication: Outpatient Encounter Medications as of 01/05/2021  Medication Sig  . albuterol (PROAIR HFA) 108 (90 Base) MCG/ACT inhaler Inhale 2 puffs into the lungs every 4 (four) hours as needed for wheezing or shortness of breath.  Marland Kitchen amoxicillin (AMOXIL) 500 MG capsule Take 1 capsule (500 mg total) by mouth 3 (three) times daily.  . benzonatate (TESSALON) 100 MG capsule Take 1 capsule (100 mg total) by mouth every 8 (eight)  hours.  . budesonide-formoterol (SYMBICORT) 80-4.5 MCG/ACT inhaler Inhale 2 puffs into the lungs 2 (two) times daily.  . hydrOXYzine (VISTARIL) 25 MG capsule Take 1 capsule (25 mg total) by mouth every 8 (eight) hours as needed for anxiety.  . indomethacin (INDOCIN) 50 MG capsule Take 1 capsule (50 mg total) by mouth 3 (three) times daily as needed.  . venlafaxine XR (EFFEXOR XR) 75 MG 24 hr capsule Take 1 capsule (75 mg total) by mouth daily with breakfast.  . venlafaxine XR (EFFEXOR-XR) 37.5 MG 24 hr capsule Take 1 capsule (37.5 mg total) by mouth daily with breakfast. Add 37.5 mg to 75 mg capsule   No facility-administered encounter medications on file as of 01/05/2021.    Surgical History: No past surgical history on file.  Medical History: Past Medical History:  Diagnosis Date  . Allergy   . Anemia   . Chronic migraine without aura without status migrainosus, not intractable   . Depression   . Elevated blood-pressure reading without diagnosis of hypertension   . Moderate concentric left ventricular hypertrophy 06/01/2017   Echocardiogram October 2018  . Morbid obesity with body mass index (BMI) of 60.0 to 69.9 in adult [facility]) 07/01/2015  . Scoliosis   . Sleep apnea, obstructive   . Thyroid disease   . Vasculitis (HCC)     Family History: Non contributory to the present illness  Social History: Social History   Socioeconomic History  . Marital status: Single    Spouse name: Not on file  .  Number of children: Not on file  . Years of education: Not on file  . Highest education level: Not on file  Occupational History  . Not on file  Tobacco Use  . Smoking status: Never Smoker  . Smokeless tobacco: Never Used  Vaping Use  . Vaping Use: Never used  Substance and Sexual Activity  . Alcohol use: No    Alcohol/week: 0.0 standard drinks    Comment: once a year  . Drug use: No  . Sexual activity: Never  Other Topics Concern  . Not on file  Social History Narrative   . Not on file   Social Determinants of Health   Financial Resource Strain: Not on file  Food Insecurity: Not on file  Transportation Needs: Not on file  Physical Activity: Not on file  Stress: Not on file  Social Connections: Not on file  Intimate Partner Violence: Not on file    Vital Signs: There were no vitals taken for this visit.  Examination: General Appearance: The patient is well-developed, well-nourished, and in no distress. Neck Circumference: 45 Skin: Gross inspection of skin unremarkable. Head: normocephalic, no gross deformities. Eyes: no gross deformities noted. ENT: ears appear grossly normal Neurologic: Alert and oriented. No involuntary movements.    EPWORTH SLEEPINESS SCALE:  Scale:  (0)= no chance of dozing; (1)= slight chance of dozing; (2)= moderate chance of dozing; (3)= high chance of dozing  Chance  Situtation    Sitting and reading: 1    Watching TV: 1    Sitting Inactive in public: 1    As a passenger in car: 3      Lying down to rest: 2    Sitting and talking: 0    Sitting quielty after lunch: 0    In a car, stopped in traffic: 0   TOTAL SCORE:   8 out of 24    SLEEP STUDIES:  1. PSG 04/05/20 - Overall AHI 35,  REM AHI 85.2,  Supine AHI 60.6,  low SpO2 60%   CPAP COMPLIANCE DATA:  Date Range: 11/06/20  -  12/05/20  Average Daily Use: 6:14 hours  Median Use: 6:37 hours  Compliance for > 4 Hours: 87% days  AHI: 1.9 respiratory events per hour  Days Used: 30/30 days  Mask Leak: 59.3  95th Percentile Pressure: 18 cmH2O         LABS: Recent Results (from the past 2160 hour(s))  Novel Coronavirus, NAA ([facility])     Status: None   Collection Time: 10/12/20  1:27 PM   Specimen: Nasopharyngeal Swab; Nasopharyngeal(NP) swabs in vial transport medium   Nasopharynge  Result Value Ref Range   SARS-CoV-2, NAA Not Detected Not Detected    Comment: This nucleic acid amplification test was developed and its  performance characteristics determined by [employer]. Nucleic acid amplification tests include RT-PCR and TMA. This test has not been FDA cleared or approved. This test has been authorized by FDA under an Emergency Use Authorization (EUA). This test is only authorized for the duration of time the declaration that circumstances exist justifying the authorization of the emergency use of in vitro diagnostic tests for detection of SARS-CoV-2 virus and/or diagnosis of COVID-19 infection under section 564(b)(1) of the Act, 21 U.S.C. 259DGL-8(V) (1), unless the authorization is terminated or revoked sooner. When diagnostic testing is negative, the possibility of a false negative result should be considered in the context of a patient's recent exposures and the presence of clinical signs and symptoms consistent  with COVID-19. An individual without symptoms of COVID-19 and who is not shedding SARS-CoV-2 virus wo uld expect to have a negative (not detected) result in this assay.   SARS-COV-2, NAA 2 DAY TAT     Status: None   Collection Time: 10/12/20  1:27 PM   Nasopharynge  Result Value Ref Range   SARS-CoV-2, NAA 2 DAY TAT Performed     Radiology: No results found.  No results found.  No results found.    Assessment and Plan: Patient Active Problem List   Diagnosis Date Noted  . Simple chronic bronchitis (HCC) 03/05/2019  . Paroxysmal tachycardia (HCC) 10/23/2018  . Moderate concentric left ventricular hypertrophy 06/01/2017  . Iron deficiency anemia 07/01/2015  . Migraine without aura and with status migrainosus, not intractable 07/01/2015  . Major depression, recurrent, full remission (HCC) 07/01/2015  . OSA (obstructive sleep apnea) 07/01/2015  . Subclinical hypothyroidism 07/01/2015  . Allergic rhinitis 07/01/2015  . Lumbosacral disc disease 07/01/2015  . Sciatica neuralgia 07/01/2015  . Morbid obesity with body mass index (BMI) of 60.0 to 69.9 in adult (HCC)  07/01/2015     1. OSA on CPAP The patient does  tolerate PAP and reports definite  benefit from PAP use. The patient was reminded how to clean equipment and replace supplies routinely.  The patient was also counselled on running mask fit setting. The compliance is good. The AHI is 2.6 . OSA- continue but try to improve compliance.   2. CPAP use counseling CPAP Counseling: had a lengthy discussion with the patient regarding the importance of PAP therapy in management of the sleep apnea. Patient appears to understand the risk factor reduction and also understands the risks associated with untreated sleep apnea. Patient will try to make a good faith effort to remain compliant with therapy. Also instructed the patient on proper cleaning of the device including the water must be changed daily if possible and use of distilled water is preferred. Patient understands that the machine should be regularly cleaned with appropriate recommended cleaning solutions that do not damage the PAP machine for example given white vinegar and water rinses. Other methods such as ozone treatment may not be as good as these simple methods to achieve cleaning.  3. Morbid obesity with body mass index (BMI) of 60.0 to 69.9 in adult [facility]) Obesity Counseling: Had a lengthy discussion regarding patients BMI and weight issues. Patient was instructed on portion control as well as increased activity. Also discussed caloric restrictions with trying to maintain intake less than 2000 Kcal. Discussions were made in accordance with the 5As of weight management. Simple actions such as not eating late and if able to, taking a walk is suggested. General Counseling: I have discussed the findings of the evaluation and examination with Nicholos Johns.  I have also discussed any further diagnostic evaluation thatmay be needed or ordered today. Jassica verbalizes understanding of the findings of todays visit. We also reviewed her medications today and  discussed drug interactions and side effects including but not limited excessive drowsiness and altered mental states. We also discussed that there is always a risk not just to her but also people around her. she has been encouraged to call the office with any questions or concerns that should arise related to todays visit.  No orders of the defined types were placed in this encounter.       I have personally obtained a history, examined the patient, evaluated laboratory and imaging results, formulated the assessment and plan and  placed orders.   This patient was seen today by Emmaline Kluver, [name] in collaboration with Dr. Freda Munro.    Yevonne Pax, MD [facility] Diplomate ABMS Pulmonary and Critical Care Medicine Sleep medicine

## 2021-01-05 ENCOUNTER — Ambulatory Visit (INDEPENDENT_AMBULATORY_CARE_PROVIDER_SITE_OTHER): Payer: BC Managed Care – PPO | Admitting: Internal Medicine

## 2021-01-05 VITALS — BP 139/70 | HR 74 | Resp 18 | Ht 68.0 in | Wt >= 6400 oz

## 2021-01-05 DIAGNOSIS — Z9989 Dependence on other enabling machines and devices: Secondary | ICD-10-CM

## 2021-01-05 DIAGNOSIS — G4733 Obstructive sleep apnea (adult) (pediatric): Secondary | ICD-10-CM | POA: Diagnosis not present

## 2021-01-05 DIAGNOSIS — Z7189 Other specified counseling: Secondary | ICD-10-CM | POA: Insufficient documentation

## 2021-01-05 DIAGNOSIS — Z6841 Body Mass Index (BMI) 40.0 and over, adult: Secondary | ICD-10-CM

## 2021-01-05 NOTE — Patient Instructions (Signed)

## 2021-02-27 LAB — VITAMIN D 25 HYDROXY (VIT D DEFICIENCY, FRACTURES): Vit D, 25-Hydroxy: 53.1

## 2021-03-04 ENCOUNTER — Encounter: Payer: Self-pay | Admitting: Family Medicine

## 2021-03-04 ENCOUNTER — Telehealth: Payer: BC Managed Care – PPO | Admitting: Family Medicine

## 2021-03-04 VITALS — Ht 68.0 in | Wt >= 6400 oz

## 2021-03-04 DIAGNOSIS — F419 Anxiety disorder, unspecified: Secondary | ICD-10-CM

## 2021-03-04 DIAGNOSIS — Z6841 Body Mass Index (BMI) 40.0 and over, adult: Secondary | ICD-10-CM

## 2021-03-04 DIAGNOSIS — F331 Major depressive disorder, recurrent, moderate: Secondary | ICD-10-CM

## 2021-03-04 DIAGNOSIS — J410 Simple chronic bronchitis: Secondary | ICD-10-CM

## 2021-03-04 DIAGNOSIS — D509 Iron deficiency anemia, unspecified: Secondary | ICD-10-CM

## 2021-03-04 MED ORDER — VENLAFAXINE HCL ER 75 MG PO CP24: 75.0000 mg | ORAL_CAPSULE | Freq: Every day | ORAL | 3 refills | Status: DC

## 2021-03-04 NOTE — Progress Notes (Signed)
Name: Melinda Fox   MRN: [id number]    DOB: February 18, 1976   Date:03/04/2021       Progress Note  Subjective:    Chief Complaint  Chief Complaint  Patient presents with   abnormal labs    Iron def, pt states taking iron supplement    I connected with  [name]  on 03/04/21 at  1:20 PM EDT by a video enabled telemedicine application and verified that I am speaking with the correct person using two identifiers.  I discussed the limitations of evaluation and management by telemedicine and the availability of in person appointments. The patient expressed understanding and agreed to proceed. Staff also discussed with the patient that there may be a patient responsible charge related to this service. Patient Location: home Provider Location: [facility] office Additional Individuals present: none  HPI  Iron Deficiency Anemia - labs reviewed through this EMR and through care everywhere =- pt doing bariatric surgery with [facility] Some labs sent through mychart message  Hemoglobin  Date Value Ref Range Status  08/21/2018 11.3 (L) 11.7 - 15.5 g/dL Final  40/98/1191 47.8 (L) 12.0 - 16.0 g/dL Final  29/56/2130 9.9 (L) 11.7 - 15.5 g/dL Final  86/57/8469 62.9 (L) 11.1 - 15.9 g/dL Final  52/84/1324 40.1 (L) 12.0 - 15.0 g/dL Final   Lab Results  Component Value Date   IRON 20 (L) 07/12/2017   TIBC 362 07/12/2017   FERRITIN 42 07/12/2017   Recent Labs: CBC [facility] 12/25/2020  Component 12/25/2020     WBC (White Blood Cell Count) 10.1 High     Hemoglobin 10.0 Low     Hematocrit 35.0  Platelets 381  MCV (Mean Corpuscular Volume) 73 Low     MCH (Mean Corpuscular Hemoglobin) 20.7 Low     MCHC (Mean Corpuscular Hemoglobin Concentration) 28.6 Low     RBC (Red Blood Cell Count) 4.82  RDW-CV (Red Cell Distribution Width) 16.6 High     NRBC (Nucleated Red Blood Cell Count) 0.00  NRBC % (Nucleated Red Blood Cell %) 0.0  MPV (Mean Platelet Volume) 10.2    IRON /ANEMIA PROFILE [facility] 12/25/2020  Component 12/25/2020 12/25/2020 12/25/2020 12/25/2020        Ferritin 16 -- -- --  Folate (Folic Acid) -- 12.2 -- --  Transferrin -- -- 359 --  Iron -- -- -- 19 Low     Total Iron Binding Capacity (TIBC) -- -- -- 503 High     Percent Transferrin Saturation -- -- --    Pt sent in more recent labs -  TIBC 364, UIBC 318, Iron 46, Iron Saturation 13   Anxiety/MDD - never increased effexor dose, still on 75 mg daily, working with therapist  Migraines - still using indomethicin prn  COPD/bronchitis - not currently using daily maintenance inhaler, rarely using albuterol rescue inhaler right now   Patient Active Problem List   Diagnosis Date Noted   OSA on CPAP 01/05/2021   CPAP use counseling 01/05/2021   Simple chronic bronchitis (HCC) 03/05/2019   Paroxysmal tachycardia (HCC) 10/23/2018   Moderate concentric left ventricular hypertrophy 06/01/2017   Iron deficiency anemia 07/01/2015   Migraine without aura and with status migrainosus, not intractable 07/01/2015   Major depression, recurrent, full remission (HCC) 07/01/2015   OSA (obstructive sleep apnea) 07/01/2015   Subclinical hypothyroidism 07/01/2015   Allergic rhinitis 07/01/2015   Lumbosacral disc disease 07/01/2015   Sciatica neuralgia 07/01/2015   Morbid obesity with body mass  index (BMI) of 60.0 to 69.9 in adult [facility]) 07/01/2015    Social History   Tobacco Use   Smoking status: Never   Smokeless tobacco: Never  Substance Use Topics   Alcohol use: No    Alcohol/week: 0.0 standard drinks    Comment: once a year     Current Outpatient Medications:    albuterol (PROAIR HFA) 108 (90 Base) MCG/ACT inhaler, Inhale 2 puffs into the lungs every 4 (four) hours as needed for wheezing or shortness of breath., Disp: 18 g, Rfl: 2   amoxicillin (AMOXIL) 500 MG capsule, Take 1 capsule (500 mg total) by mouth 3 (three) times daily., Disp: 21 capsule, Rfl:  0   Biotin 10 MG CAPS, Take by mouth., Disp: , Rfl:    budesonide-formoterol (SYMBICORT) 80-4.5 MCG/ACT inhaler, Inhale 2 puffs into the lungs 2 (two) times daily., Disp: 1 each, Rfl: 11   ergocalciferol (VITAMIN D2) 1.25 MG (50000 UT) capsule, Take by mouth., Disp: , Rfl:    ferrous sulfate 325 (65 FE) MG tablet, Take by mouth., Disp: , Rfl:    hydrOXYzine (VISTARIL) 25 MG capsule, Take 1 capsule (25 mg total) by mouth every 8 (eight) hours as needed for anxiety., Disp: 90 capsule, Rfl: 2   hydrOXYzine (VISTARIL) 25 MG capsule, Take by mouth., Disp: , Rfl:    indomethacin (INDOCIN) 50 MG capsule, Take 1 capsule (50 mg total) by mouth 3 (three) times daily as needed., Disp: 40 capsule, Rfl: 0   Multiple Vitamin (MULTI-VITAMIN) tablet, Take 1 tablet by mouth daily., Disp: , Rfl:    venlafaxine XR (EFFEXOR XR) 75 MG 24 hr capsule, Take 1 capsule (75 mg total) by mouth daily with breakfast., Disp: 90 capsule, Rfl: 3   venlafaxine XR (EFFEXOR-XR) 37.5 MG 24 hr capsule, Take 1 capsule (37.5 mg total) by mouth daily with breakfast. Add 37.5 mg to 75 mg capsule, Disp: 60 capsule, Rfl: 0   venlafaxine XR (EFFEXOR-XR) 75 MG 24 hr capsule, Take by mouth., Disp: , Rfl:   Allergies  Allergen Reactions   Sulfa Antibiotics Other (See Comments)    Was told that she had it as a child Other reaction(s): Unknown    I personally reviewed active problem list, medication list, allergies, family history, social history, health maintenance, notes from last encounter, lab results, imaging with the patient/caregiver today.   Review of Systems  Constitutional: Negative.   HENT: Negative.    Eyes: Negative.   Respiratory: Negative.    Cardiovascular: Negative.   Gastrointestinal: Negative.   Endocrine: Negative.   Genitourinary: Negative.   Musculoskeletal: Negative.   Skin: Negative.   Allergic/Immunologic: Negative.   Neurological: Negative.   Hematological: Negative.   Psychiatric/Behavioral: Negative.     All other systems reviewed and are negative.    Objective:   Virtual encounter, vitals limited, only able to obtain the following Today's Vitals   03/04/21 1101  Weight: (!) 450 lb (204.1 kg)  Height: 5\' 8"  (1.727 m)   Body mass index is 68.42 kg/m. Nursing Note and Vital Signs reviewed.  Physical Exam Vitals and nursing note reviewed.  Constitutional:      General: She is not in acute distress.    Appearance: She is obese. She is not ill-appearing, toxic-appearing or diaphoretic.  HENT:     Head: Normocephalic and atraumatic.  Pulmonary:     Effort: No respiratory distress.  Neurological:     Mental Status: She is alert.    PE limited by virtual encounter  No results found for this or any previous visit (from the past 72 hour(s)).  Assessment and Plan:      ICD-10-CM   1. Iron deficiency anemia, unspecified iron deficiency anemia type  D50.9    lab reviewed from [facility], on daily iron supplement, iron panel improving from May to July, increase dose as tolerated with GI effect, get f/up plan    2. Moderate episode of recurrent major depressive disorder (HCC)  F33.1 venlafaxine XR (EFFEXOR XR) 75 MG 24 hr capsule   doing well currently on 75 mg daily dose of effexor, continues with therapist    3. Anxiety disorder, unspecified type  F41.9    same as above, currently doing well and not needing hydroxyzine often - uses more when working/school in evenings    4. Simple chronic bronchitis (HCC)  J41.0    not using symbicort currently, rare use of rescue inhaler, well controlled    5. Morbid obesity with body mass index (BMI) of 60.0 to 69.9 in adult (HCC)  E66.01    Z68.44    working with [employer] - scheduled for Oct 10th for roux-en-Y surgery, discussed deficiencies, f/up plan     Lab extensively reviewed and discussed today, personally reviewed by me through 2020 and prior labs, [facility] recent labs and through [employer] with copy of labs sent by  pt Vit D def, Iron Def anemia,   -Red flags and when to present for emergency care or RTC including fever >101.2F, chest pain, shortness of breath, new/worsening/un-resolving symptoms, reviewed with patient at time of visit. Follow up and care instructions discussed and provided in AVS. - I discussed the assessment and treatment plan with the patient. The patient was provided an opportunity to ask questions and all were answered. The patient agreed with the plan and demonstrated an understanding of the instructions.  I provided 25+ minutes of non-face-to-face time during this encounter and additional 8+ to review chart and records and document  Danelle Berry, [name] 03/04/21 1:38 PM

## 2021-04-02 ENCOUNTER — Encounter: Payer: Self-pay | Admitting: Family Medicine

## 2021-04-24 NOTE — Progress Notes (Signed)
Sleep Medicine   Office Visit  Patient Name: Melinda Fox DOB: 05/11/1964 MRN [id number]    Chief Complaint: ***  Brief History:  Melinda presents for initial sleep consult with a *** history of ***. Sleep quality is ***. This is noted *** nights. The patient's bed partner reports  *** at night. The patient relates the following symptoms: *** are also present. The patient goes to sleep at *** and wakes up at ***.   Sleep quality is *** when outside home environment.  Patient has noted *** of her legs at night.  The patient  relates *** behavior during the night.  The patient *** a history of psychiatric problems. The Epworth Sleepiness Score is *** out of 24 .  The patient relates  Cardiovascular risk factors include: *** The patient reports ***    ROS  General: (-) fever, (-) chills, (-) night sweat Nose and Sinuses: (-) nasal stuffiness or itchiness, (-) postnasal drip, (-) nosebleeds, (-) sinus trouble. Mouth and Throat: (-) sore throat, (-) hoarseness. Neck: (-) swollen glands, (-) enlarged thyroid, (-) neck pain. Respiratory: *** cough, *** shortness of breath, *** wheezing. Neurologic: *** numbness, *** tingling. Psychiatric: *** anxiety, *** depression Sleep behavior: ***sleep paralysis ***hypnogogic hallucinations ***dream enactment      ***vivid dreams ***cataplexy ***night terrors ***sleep walking   Current Medication: No outpatient encounter medications on file as of 10/07/2022.   No facility-administered encounter medications on file as of 10/07/2022.    Surgical History: *** The histories are not reviewed yet. Please review them in the "History" navigator section and refresh this SmartLink.  Medical History: No past medical history on file.  Family History: Non contributory to the present illness  Social History: Social History   Socioeconomic History   Marital status: Not on file    Spouse name: Not on file   Number of children: Not on file   Years of  education: Not on file   Highest education level: Not on file  Occupational History   Not on file  Tobacco Use   Smoking status: Not on file   Smokeless tobacco: Not on file  Substance and Sexual Activity   Alcohol use: Not on file   Drug use: Not on file   Sexual activity: Not on file  Other Topics Concern   Not on file  Social History Narrative   Not on file   Social Determinants of Health   Financial Resource Strain: Not on file  Food Insecurity: Not on file  Transportation Needs: Not on file  Physical Activity: Not on file  Stress: Not on file  Social Connections: Not on file  Intimate Partner Violence: Not on file    Vital Signs: There were no vitals taken for this visit. There is no height or weight on file to calculate BMI.   Examination: General Appearance: The patient is well-developed, well-nourished, and in no distress. Neck Circumference: *** Skin: Gross inspection of skin unremarkable. Head: normocephalic, no gross deformities. Eyes: no gross deformities noted. ENT: ears appear grossly normal Neurologic: Alert and oriented. No involuntary movements.    STOP BANG RISK ASSESSMENT S (snore) Have you been told that you snore?     YES/N   T (tired) Are you often tired, fatigued, or sleepy during the day?   YES/NO  O (obstruction) Do you stop breathing, choke, or gasp during sleep? YES/NO   P (pressure) Do you have or are you being treated for high blood pressure? YES/NO   B (  BMI) Is your body index greater than 35 kg/m? YES/NO   A (age) Are you 50 years old or older? YES/NO   N (neck) Do you have a neck circumference greater than 16 inches?   YES/NO   G (gender) Are you a female? YES/NO   TOTAL STOP/BANG "YES" ANSWERS                                                                A STOP-Bang score of 2 or less is considered low risk, and a score of 5 or more is high risk for having either moderate or severe OSA. For people who score 3 or 4,  doctors may need to perform further assessment to determine how likely they are to have OSA.         EPWORTH SLEEPINESS SCALE:  Scale:  (0)= no chance of dozing; (1)= slight chance of dozing; (2)= moderate chance of dozing; (3)= high chance of dozing  Chance  Situtation    Sitting and reading: ***    Watching TV: ***    Sitting Inactive in public: ***    As a passenger in car: ***      Lying down to rest: ***    Sitting and talking: ***    Sitting quielty after lunch: ***    In a car, stopped in traffic: ***   TOTAL SCORE:   *** out of 24    SLEEP STUDIES:  ***   LABS: No results found for this or any previous visit (from the past 2160 hour(s)).  Radiology: Patient was never admitted.  No results found.  No results found.    Assessment and Plan: There are no problems to display for this patient.    PLAN OSA:   Patient evaluation suggests high risk of sleep disordered breathing due to *** Patient has comorbid cardiovascular risk factors including: *** which could be exacerbated by pathologic sleep-disordered breathing.  Suggest: *** to assess/treat the patient's sleep disordered breathing. The patient was also counselled on *** to optimize sleep health.  PLAN hypersomnia:  Patient evaluation suggests significant daytime hypersomnia.  The Epworth Sleepiness Score is elevated at *** out of 24. Patient *** drowsy driving. The patient *** MVA due to sleepiness.  The patient *** restless leg symptoms which exacerbate *** for *** nights per week. The patient *** periodic limb movements which exacerbate ***  for *** nights per week. Suggest: ***  Also suggest ***  PLAN insomnia:  Patient evaluation suggests *** insomnia. This is a chronic disorder. This has been a concern for *** and causes impaired daytime functioning. The patient exhibits comorbid ***  The history *** suggest the insomnia predates the use of hypnotic medications. The symptoms *** with the  discontinuation of these medications. There is no obvious medical, psychiatric or pharmacologic abuse issues ot account for the insomnia.  Treatment recommendations include: *** The patient should maintain a sleep log and calculate total sleep time for 1-2 weeks. Set bed and wake times for achieve 85% sleep efficiency for one week. Once this is achieved  time in bed can be gradually increased. A pharmacologic treatment approach would include a trial of *** for the next ***  months. During this time the patient is to maintain a sleep   diary to track progress.    ***  General Counseling: I have discussed the findings of the evaluation and examination with Melinda.  I have also discussed any further diagnostic evaluation thatmay be needed or ordered today. Melinda verbalizes understanding of the findings of todays visit. We also reviewed her medications today and discussed drug interactions and side effects including but not limited excessive drowsiness and altered mental states. We also discussed that there is always a risk not just to her but also people around her. she has been encouraged to call the office with any questions or concerns that should arise related to todays visit.  No orders of the defined types were placed in this encounter.       I have personally obtained a history, evaluated the patient, evaluated pertinent data, formulated the assessment and plan and placed orders.    Blessin Kanno A Amany Rando, MD FCCP Diplomate ABMS Pulmonary and Critical Care Medicine Sleep medicine  

## 2021-04-27 ENCOUNTER — Ambulatory Visit: Payer: BC Managed Care – PPO | Admitting: Internal Medicine

## 2021-05-12 ENCOUNTER — Encounter: Payer: Self-pay | Admitting: Family Medicine

## 2021-05-25 HISTORY — PX: SLEEVE GASTROPLASTY: SHX1101

## 2021-05-28 ENCOUNTER — Telehealth: Payer: Self-pay

## 2021-05-28 NOTE — Telephone Encounter (Signed)
Transition Care Management Unsuccessful Follow-up Telephone Call  Date of discharge and from where:  05/27/21 from [facility]  Attempts:  1st Attempt  Reason for unsuccessful TCM follow-up call:  Patient states she is napping and request a call back. Outreach will be attempted at another time.   Kathyrn Sheriff, RN, MSN, BSN, CCM [facility] Care Management Coordinator [phone]

## 2021-05-29 ENCOUNTER — Telehealth: Payer: Self-pay

## 2021-05-29 NOTE — Telephone Encounter (Signed)
Transition Care Management Follow-up Telephone Call Date of discharge and from where: 05/27/21 from [facility] How have you been since you were released from the hospital? "Sore and tired", but "ok". Any questions or concerns? No  Items Reviewed: Did the pt receive and understand the discharge instructions provided? Yes  Medications obtained and verified? Yes  Other? No  Any new allergies since your discharge? No  Dietary orders reviewed? Yes Do you have support at home? No   Home Care and Equipment/Supplies: Were home health services ordered? no If so, what is the name of the agency? Not applicable  Has the agency set up a time to come to the patient's home? not applicable Were any new equipment or medical supplies ordered?  No What is the name of the medical supply agency? Not applicable Were you able to get the supplies/equipment? not applicable Do you have any questions related to the use of the equipment or supplies? No  Functional Questionnaire: (I = Independent and D = Dependent) ADLs: I  Bathing/Dressing- I  Meal Prep- I  Eating- I  Maintaining continence- I  Transferring/Ambulation- I  Managing Meds- I  Follow up appointments reviewed:  PCP Hospital f/u appt confirmed?  Following up with specialist.    Specialist Hospital f/u appt confirmed? Yes  Scheduled to see [facility] clinic on 06/09/21 @ 96:75 am. Are transportation arrangements needed? No  If their condition worsens, is the pt aware to call PCP or go to the Emergency Dept.? Yes Was the patient provided with contact information for the PCP's office or ED? Yes Was pt encouraged to call back with questions or concerns? Yes

## 2021-06-04 DIAGNOSIS — Z9884 Bariatric surgery status: Secondary | ICD-10-CM | POA: Insufficient documentation

## 2021-06-08 ENCOUNTER — Encounter: Payer: Self-pay | Admitting: Family Medicine

## 2021-06-13 ENCOUNTER — Emergency Department (HOSPITAL_COMMUNITY): Payer: BC Managed Care – PPO

## 2021-06-13 ENCOUNTER — Emergency Department (HOSPITAL_COMMUNITY)
Admission: EM | Admit: 2021-06-13 | Discharge: 2021-06-13 | Disposition: A | Discharge status: Home / Self Care | Payer: BC Managed Care – PPO | Attending: Emergency Medicine | Admitting: Emergency Medicine

## 2021-06-13 ENCOUNTER — Other Ambulatory Visit: Payer: Self-pay

## 2021-06-13 ENCOUNTER — Encounter (HOSPITAL_COMMUNITY): Payer: Self-pay

## 2021-06-13 DIAGNOSIS — S4992XA Unspecified injury of left shoulder and upper arm, initial encounter: Secondary | ICD-10-CM | POA: Diagnosis present

## 2021-06-13 DIAGNOSIS — X58XXXA Exposure to other specified factors, initial encounter: Secondary | ICD-10-CM | POA: Diagnosis not present

## 2021-06-13 DIAGNOSIS — S46912A Strain of unspecified muscle, fascia and tendon at shoulder and upper arm level, left arm, initial encounter: Secondary | ICD-10-CM | POA: Insufficient documentation

## 2021-06-13 DIAGNOSIS — M542 Cervicalgia: Secondary | ICD-10-CM | POA: Insufficient documentation

## 2021-06-13 LAB — COMPREHENSIVE METABOLIC PANEL
ALT: 33 U/L (ref 0–44)
AST: 25 U/L (ref 15–41)
Albumin: 3.6 g/dL (ref 3.5–5.0)
Alkaline Phosphatase: 84 U/L (ref 38–126)
Anion gap: 6 (ref 5–15)
BUN: 11 mg/dL (ref 6–20)
CO2: 28 mmol/L (ref 22–32)
Calcium: 9.6 mg/dL (ref 8.9–10.3)
Chloride: 103 mmol/L (ref 98–111)
Creatinine, Ser: 0.62 mg/dL (ref 0.44–1.00)
GFR, Estimated: >60 mL/min (ref >60)
Glucose, Bld: 93 mg/dL (ref 70–99)
Potassium: 4.2 mmol/L (ref 3.5–5.1)
Sodium: 137 mmol/L (ref 135–145)
Total Bilirubin: 0.5 mg/dL (ref 0.3–1.2)
Total Protein: 7.2 g/dL (ref 6.5–8.1)

## 2021-06-13 LAB — CBC WITH DIFFERENTIAL/PLATELET
Abs Immature Granulocytes: 0.04 10*3/uL (ref 0.00–0.07)
Basophils Absolute: 0.1 10*3/uL (ref 0.0–0.1)
Basophils Relative: 1 %
Eosinophils Absolute: 0.3 10*3/uL (ref 0.0–0.5)
Eosinophils Relative: 4 %
HCT: 40.6 % (ref 36.0–46.0)
Hemoglobin: 12.2 g/dL (ref 12.0–15.0)
Immature Granulocytes: 1 %
Lymphocytes Relative: 25 %
Lymphs Abs: 1.8 10*3/uL (ref 0.7–4.0)
MCH: 24.7 pg — ABNORMAL LOW (ref 26.0–34.0)
MCHC: 30 g/dL (ref 30.0–36.0)
MCV: 82.4 fL (ref 80.0–100.0)
Monocytes Absolute: 0.6 10*3/uL (ref 0.1–1.0)
Monocytes Relative: 9 %
Neutro Abs: 4.3 10*3/uL (ref 1.7–7.7)
Neutrophils Relative %: 60 %
Platelets: 342 10*3/uL (ref 150–400)
RBC: 4.93 MIL/uL (ref 3.87–5.11)
RDW: 15.3 % (ref 11.5–15.5)
WBC: 7 10*3/uL (ref 4.0–10.5)
nRBC: 0 % (ref 0.0–0.2)

## 2021-06-13 LAB — MAGNESIUM: Magnesium: 2.1 mg/dL (ref 1.7–2.4)

## 2021-06-13 LAB — HCG, SERUM, QUALITATIVE: Preg, Serum: NEGATIVE

## 2021-06-13 MED ORDER — HYDROCODONE-ACETAMINOPHEN 5-325 MG PO TABS
1.0000 | ORAL_TABLET | Freq: Once | ORAL | Status: AC
  Administered 2021-06-13: 1 via ORAL
  Filled 2021-06-13: qty 1

## 2021-06-13 MED ORDER — DIPHENHYDRAMINE HCL 25 MG PO CAPS
25.0000 mg | ORAL_CAPSULE | Freq: Once | ORAL | Status: AC
  Administered 2021-06-13: 25 mg via ORAL
  Filled 2021-06-13: qty 1

## 2021-06-13 NOTE — ED Triage Notes (Signed)
Pt presents to ED with complaints of left shoulder pain since 05/25/21. Pt had bariatric surgery on 05/25/21 and has been having pain since. Pt was initially told it was gas. Pt contacted physician yesterday and had labs ordered but did not have them drawn because [facility] had closed. MD wanted CMET, Magnesium and CBC.

## 2021-06-13 NOTE — ED Notes (Signed)
Patient believes she could be having an allergic reaction to the Vicodin. Per patient warm and itching across chest with tingling to the lips. Airway patent. Denies any difficulty breathing or swallowing. Dr Juanita Craver aware and is going to room to assess patient.

## 2021-06-13 NOTE — ED Notes (Signed)
Pt reports that lips felt "tingly" and chest itching where ecg lead stickers were placed. Chest noted to be reddened and warm to touch. No swelling of tongue or difficulty breathing or swallowing.  EDP notified and at bedside. 25mg  Benadryl administered per new orders.

## 2021-06-13 NOTE — ED Provider Notes (Signed)
[facility] EMERGENCY DEPARTMENT Provider Note   CSN: [id number] Arrival date & time: 06/13/21  5188     History Chief Complaint  Patient presents with   Shoulder Pain    Melinda Fox is a 45 y.o. female.  This is a 45 y.o. female with significant medical history as below, including s/p bariatric surgery oct 10th at [facility] who presents to the ED with complaint of left shoulder pain.   Location:  left anterior shoulder Duration:  19 days Onset:  felt following surgery Timing:  intermittently worse but has constant pain in the background at 2-3 /10 level Description:  sharp, stabbing Severity:  mild Exacerbating/Alleviating Factors:  none identified to make worse, mildly better w/ tylenol Associated Symptoms:  some mild neck pain Pertinent Negatives:  no fevers, chills, n/v/d, no change bowel or bladder fxn, no numbness or tingling, no falls, no HA, no back pain Context: patient with bariatric surgery earlier in the month at [facility], felt pain upon awakening from anesthesia to left shoulder, has not improved and is becoming mildly worse. Taking APAP, talked w/ surgeon who was going to start gabapentin per pt.     The history is provided by the patient. No language interpreter was used.  Shoulder Pain Associated symptoms: no fever       Past Medical History:  Diagnosis Date   Allergy    Anemia    Chronic migraine without aura without status migrainosus, not intractable    Depression    Elevated blood-pressure reading without diagnosis of hypertension    Moderate concentric left ventricular hypertrophy 06/01/2017   Echocardiogram October 2018   Morbid obesity with body mass index (BMI) of 60.0 to 69.9 in adult [facility]) 07/01/2015   Scoliosis    Sleep apnea, obstructive    Thyroid disease    Vasculitis (HCC)     Patient Active Problem List   Diagnosis Date Noted   OSA on CPAP 01/05/2021   CPAP use counseling 01/05/2021   Simple chronic bronchitis (HCC) 03/05/2019    Paroxysmal tachycardia (HCC) 10/23/2018   Moderate concentric left ventricular hypertrophy 06/01/2017   Iron deficiency anemia 07/01/2015   Migraine without aura and with status migrainosus, not intractable 07/01/2015   Major depression, recurrent, full remission (HCC) 07/01/2015   OSA (obstructive sleep apnea) 07/01/2015   Subclinical hypothyroidism 07/01/2015   Allergic rhinitis 07/01/2015   Lumbosacral disc disease 07/01/2015   Sciatica neuralgia 07/01/2015   Morbid obesity with body mass index (BMI) of 60.0 to 69.9 in adult [facility]) 07/01/2015    Past Surgical History:  Procedure Laterality Date   BARIATRIC SURGERY       OB History   No obstetric history on file.     Family History  Adopted: Yes  Family history unknown: Yes    Social History   Tobacco Use   Smoking status: Never   Smokeless tobacco: Never  Vaping Use   Vaping Use: Never used  Substance Use Topics   Alcohol use: No    Alcohol/week: 0.0 standard drinks    Comment: once a year   Drug use: No    Home Medications Prior to Admission medications   Medication Sig Start Date End Date Taking? Authorizing Provider  albuterol (PROAIR HFA) 108 (90 Base) MCG/ACT inhaler Inhale 2 puffs into the lungs every 4 (four) hours as needed for wheezing or shortness of breath. 08/26/20   Danelle Berry, [name]  amoxicillin (AMOXIL) 500 MG capsule Take 1 capsule (500 mg total) by mouth 3 (  three) times daily. 10/12/20   Avegno, Zachery Dakins, FNP  Biotin 10 MG CAPS Take by mouth.    [provider]  budesonide-formoterol (SYMBICORT) 80-4.5 MCG/ACT inhaler Inhale 2 puffs into the lungs 2 (two) times daily. 08/26/20   Danelle Berry, [name]  ferrous sulfate 325 (65 FE) MG tablet Take by mouth.    [provider]  hydrOXYzine (VISTARIL) 25 MG capsule Take 1 capsule (25 mg total) by mouth every 8 (eight) hours as needed for anxiety. 08/26/20   Danelle Berry, [name]  hydrOXYzine (VISTARIL) 25 MG capsule Take by mouth. 01/02/20    [provider]  indomethacin (INDOCIN) 50 MG capsule Take 1 capsule (50 mg total) by mouth 3 (three) times daily as needed. 01/01/20   Danelle Berry, [name]  Multiple Vitamin (MULTI-VITAMIN) tablet Take 1 tablet by mouth daily.    [provider]  venlafaxine XR (EFFEXOR XR) 75 MG 24 hr capsule Take 1 capsule (75 mg total) by mouth daily with breakfast. 03/04/21   Danelle Berry, [name]    Allergies    Sulfa antibiotics and Spearmint flavor  Review of Systems   Review of Systems  Constitutional:  Negative for chills and fever.  HENT:  Negative for facial swelling and trouble swallowing.   Eyes:  Negative for photophobia and visual disturbance.  Respiratory:  Negative for cough and shortness of breath.   Cardiovascular:  Negative for chest pain and palpitations.  Gastrointestinal:  Negative for abdominal pain, nausea and vomiting.  Endocrine: Negative for polydipsia and polyuria.  Genitourinary:  Negative for difficulty urinating and hematuria.  Musculoskeletal:  Positive for arthralgias. Negative for gait problem and joint swelling.  Skin:  Negative for pallor and rash.  Neurological:  Negative for syncope and headaches.  Psychiatric/Behavioral:  Negative for agitation and confusion.    Physical Exam Updated Vital Signs BP 112/70   Pulse (!) 56   Temp 98.6 F (37 C) (Oral)   Resp 16   Ht 5\' 8"  (1.727 m)   Wt (!) 186 kg   LMP 05/11/2021   SpO2 99%   BMI 62.34 kg/m   Physical Exam Vitals and nursing note reviewed.  Constitutional:      General: She is not in acute distress.    Appearance: Normal appearance. She is obese.  HENT:     Head: Normocephalic and atraumatic.     Right Ear: External ear normal.     Left Ear: External ear normal.     Nose: Nose normal.     Mouth/Throat:     Mouth: Mucous membranes are moist.  Eyes:     General: No scleral icterus.       Right eye: No discharge.        Left eye: No discharge.  Cardiovascular:     Rate and Rhythm:  Normal rate and regular rhythm.     Pulses: Normal pulses.     Heart sounds: Normal heart sounds.  Pulmonary:     Effort: Pulmonary effort is normal. No respiratory distress.     Breath sounds: Normal breath sounds.  Abdominal:     General: Abdomen is flat.     Palpations: Abdomen is soft.     Tenderness: There is no abdominal tenderness.  Musculoskeletal:        General: Normal range of motion.     Cervical back: Normal range of motion.     Right lower leg: No edema.     Left lower leg: No edema.  Comments: 2+ radial pulse b/l, no pain with passive or active ROM to left UE.   Skin:    General: Skin is warm and dry.     Capillary Refill: Capillary refill takes less than 2 seconds.     Comments: Well healed surgical wounds to abdomen, no cellulitic changes, no erythema or drainage, abdomen is not ttp  Neurological:     Mental Status: She is alert and oriented to person, place, and time.     GCS: GCS eye subscore is 4. GCS verbal subscore is 5. GCS motor subscore is 6.  Psychiatric:        Mood and Affect: Mood normal.        Behavior: Behavior normal.    ED Results / Procedures / Treatments   Labs (all labs ordered are listed, but only abnormal results are displayed) Labs Reviewed  CBC WITH DIFFERENTIAL/PLATELET - Abnormal; Notable for the following components:      Result Value   MCH 24.7 (*)    All other components within normal limits  MAGNESIUM  COMPREHENSIVE METABOLIC PANEL  HCG, SERUM, QUALITATIVE    EKG EKG Interpretation  Date/Time:  Saturday June 13 2021 07:35:21 EDT Ventricular Rate:  60 PR Interval:  167 QRS Duration: 107 QT Interval:  422 QTC Calculation: 422 R Axis:   -4 Text Interpretation: Sinus rhythm Low voltage, precordial leads Consider anterior infarct No stemi Confirmed by Tanda Rockers ([id number]) on 06/13/2021 7:44:54 AM  Radiology DG Shoulder Left  Result Date: 06/13/2021 CLINICAL DATA:  Anterior shoulder pain after surgery 05/25/2021  EXAM: LEFT SHOULDER - 2 VIEW COMPARISON:  None. FINDINGS: There is no evidence of fracture or dislocation. There is no evidence of arthropathy or other focal bone abnormality. Soft tissues are unremarkable. IMPRESSION: Negative. Electronically Signed   By: Tiburcio Pea M.D.   On: 06/13/2021 08:58    Procedures Procedures   Medications Ordered in ED Medications  HYDROcodone-acetaminophen (NORCO/VICODIN) 5-325 MG per tablet 1 tablet (1 tablet Oral Given 06/13/21 0837)  diphenhydrAMINE (BENADRYL) capsule 25 mg (25 mg Oral Given 06/13/21 3338)    ED Course  I have reviewed the triage vital signs and the nursing notes.  Pertinent labs & imaging results that were available during my care of the patient were reviewed by me and considered in my medical decision making (see chart for details).  Clinical Course as of 06/13/21 1055  Sat Jun 13, 2021  3291 Per pt, surgeon number is [phone] [SG]  [phone] Paged surgical at [facility] x2, awaiting callback. Labs reviewed and are stable. XR negative. Pain improved with norco.  [SG]    Clinical Course User Index [SG] Sloan Leiter, DO   MDM Rules/Calculators/A&P                          CC: left shoulder pain  This patient complains of symptoms as above; this involves an extensive number of treatment options and is a complaint that carries with it a high risk of complications and morbidity. Vital signs were reviewed. Serious etiologies considered.  Record review:  Previous records obtained and reviewed   Additional history obtained from surgeon Dr Rhona Leavens   Work up as above, notable for:  Labs & imaging results that were available during my care of the patient were reviewed by me and considered in my medical decision making.   I ordered imaging studies which included shoulder XR and I independently visualized and interpreted imaging which  showed no acute process  Management: Give norco, patient initially reported no allergies to analgesics  however after receiving Norco she began to experience urticaria on her chest.  Patient was reassessed, she reports that she has had a similar reaction with oxycodone in the past.  Reassessment:  D/w  patient's surgeon, Dr Rhona Leavens @ [facility], reviewed labs and imaging, vital signs.  Feel etiology of pain is most likely MSK they will start analgesics. I agree with this plan.   The patient improved significantly and was discharged in stable condition. Detailed discussions were had with the patient regarding current findings, and need for close f/u with PCP or on call doctor. The patient has been instructed to return immediately if the symptoms worsen in any way for re-evaluation. Patient verbalized understanding and is in agreement with current care plan. All questions answered prior to discharge.           This chart was dictated using voice recognition software.  Despite best efforts to proofread,  errors can occur which can change the documentation meaning.  Final Clinical Impression(s) / ED Diagnoses Final diagnoses:  Strain of left shoulder, initial encounter    Rx / DC Orders ED Discharge Orders     None        Sloan Leiter, DO 06/13/21 1055

## 2021-08-21 ENCOUNTER — Ambulatory Visit: Payer: Self-pay | Admitting: *Deleted

## 2021-08-21 ENCOUNTER — Telehealth: Payer: BC Managed Care – PPO | Admitting: Emergency Medicine

## 2021-08-21 DIAGNOSIS — J069 Acute upper respiratory infection, unspecified: Secondary | ICD-10-CM | POA: Diagnosis not present

## 2021-08-21 MED ORDER — BENZONATATE 100 MG PO CAPS: 100.0000 mg | ORAL_CAPSULE | Freq: Two times a day (BID) | ORAL | 0 refills | Status: DC | PRN

## 2021-08-21 MED ORDER — FLUTICASONE PROPIONATE 50 MCG/ACT NA SUSP: 2.0000 | Freq: Every day | NASAL | 0 refills | Status: DC

## 2021-08-21 NOTE — Progress Notes (Signed)
E-Visit for Upper Respiratory Infection   We are sorry you are not feeling well.  Here is how we plan to help!  Based on what you have shared with me, it looks like you may have a viral upper respiratory infection.  Upper respiratory infections are caused by a large number of viruses; however, rhinovirus is the most common cause.   I wouldn't start you on antibiotics or steroids at this point.  If still having symptoms after 10 days, we may change treatment.  Symptoms vary from person to person, with common symptoms including sore throat, cough, fatigue or lack of energy and feeling of general discomfort.  A low-grade fever of up to 100.4 may present, but is often uncommon.  Symptoms vary however, and are closely related to a person's age or underlying illnesses.  The most common symptoms associated with an upper respiratory infection are nasal discharge or congestion, cough, sneezing, headache and pressure in the ears and face.  These symptoms usually persist for about 3 to 10 days, but can last up to 2 weeks.  It is important to know that upper respiratory infections do not cause serious illness or complications in most cases.    Upper respiratory infections can be transmitted from person to person, with the most common method of transmission being a person's hands.  The virus is able to live on the skin and can infect other persons for up to 2 hours after direct contact.  Also, these can be transmitted when someone coughs or sneezes; thus, it is important to cover the mouth to reduce this risk.  To keep the spread of the illness at bay, good hand hygiene is very important.  This is an infection that is most likely caused by a virus. There are no specific treatments other than to help you with the symptoms until the infection runs its course.  We are sorry you are not feeling well.  Here is how we plan to help!   For nasal congestion, you may use an oral decongestants such as Mucinex D or if you  have glaucoma or high blood pressure use plain Mucinex.  Saline nasal spray or nasal drops can help and can safely be used as often as needed for congestion.  For your congestion, I have prescribed Fluticasone nasal spray one spray in each nostril twice a day  If you do not have a history of heart disease, hypertension, diabetes or thyroid disease, prostate/bladder issues or glaucoma, you may also use Sudafed to treat nasal congestion.  It is highly recommended that you consult with a pharmacist or your primary care physician to ensure this medication is safe for you to take.     If you have a cough, you may use cough suppressants such as Delsym and Robitussin.  If you have glaucoma or high blood pressure, you can also use Coricidin HBP.   For cough I have prescribed for you A prescription cough medication called Tessalon Perles 100 mg. You may take 1-2 capsules every 8 hours as needed for cough  If you have a sore or scratchy throat, use a saltwater gargle-  to  teaspoon of salt dissolved in a 4-ounce to 8-ounce glass of warm water.  Gargle the solution for approximately 15-30 seconds and then spit.  It is important not to swallow the solution.  You can also use throat lozenges/cough drops and Chloraseptic spray to help with throat pain or discomfort.  Warm or cold liquids can also be helpful  in relieving throat pain.  For headache, pain or general discomfort, you can use Ibuprofen or Tylenol as directed.   Some authorities believe that zinc sprays or the use of Echinacea may shorten the course of your symptoms.   HOME CARE Only take medications as instructed by your medical team. Be sure to drink plenty of fluids. Water is fine as well as fruit juices, sodas and electrolyte beverages. You may want to stay away from caffeine or alcohol. If you are nauseated, try taking small sips of liquids. How do you know if you are getting enough fluid? Your urine should be a pale yellow or almost  colorless. Get rest. Taking a steamy shower or using a humidifier may help nasal congestion and ease sore throat pain. You can place a towel over your head and breathe in the steam from hot water coming from a faucet. Using a saline nasal spray works much the same way. Cough drops, hard candies and sore throat lozenges may ease your cough. Avoid close contacts especially the very young and the elderly Cover your mouth if you cough or sneeze Always remember to wash your hands.   GET HELP RIGHT AWAY IF: You develop worsening fever. If your symptoms do not improve within 10 days You develop yellow or green discharge from your nose over 3 days. You have coughing fits You develop a severe head ache or visual changes. You develop shortness of breath, difficulty breathing or start having chest pain Your symptoms persist after you have completed your treatment plan  MAKE SURE YOU  Understand these instructions. Will watch your condition. Will get help right away if you are not doing well or get worse.  Thank you for choosing an e-visit.  Your e-visit answers were reviewed by a board certified advanced clinical practitioner to complete your personal care plan. Depending upon the condition, your plan could have included both over the counter or prescription medications.  Please review your pharmacy choice. Make sure the pharmacy is open so you can pick up prescription now. If there is a problem, you may contact your provider through [employer] and have the prescription routed to another pharmacy.  Your safety is important to [place]. If you have drug allergies check your prescription carefully.   For the next 24 hours you can use MyChart to ask questions about today's visit, request a non-urgent call back, or ask for a work or school excuse. You will get an email in the next two days asking about your experience. I hope that your e-visit has been valuable and will speed your  recovery.    Approximately 5 minutes was used in reviewing the patient's chart, questionnaire, prescribing medications, and documentation.

## 2021-08-21 NOTE — Telephone Encounter (Signed)
°  Chief Complaint:  hx bronchitis requesting medication for cold sx  Symptoms: headache, non productive cough, sore throat, chest tightness upper chest from cough, runny nose , bloody drainage from "dry heat" Frequency: since 08/18/21 Pertinent Negatives: Patient denies fever, chest pain, difficulty breathing  Disposition: [] ED /[] Urgent Care (no appt availability in office) / [] Appointment(In office/virtual)/ [x]  [facility]/ [] Home Care/ [] Refused Recommended Disposition /[] [facility]/ []  Follow-up with PCP Additional Notes:  Requesting a call back if any available provider can see her today. Covid test at home yesterday negative .       Reason for Disposition  Cough has been present for > 3 weeks    Cough x 3 days  Answer Assessment - Initial Assessment Questions 1. ONSET: "When did the cough begin?"      Wednesday 08/19/21 2. SEVERITY: "How bad is the cough today?"      Getting worse  3. SPUTUM: "Describe the color of your sputum" (none, dry cough; clear, white, yellow, green)     none 4. HEMOPTYSIS: "Are you coughing up any blood?" If so ask: "How much?" (flecks, streaks, tablespoons, etc.)     No blowing nose and noted blood , "dry heat" 5. DIFFICULTY BREATHING: "Are you having difficulty breathing?" If Yes, ask: "How bad is it?" (e.g., mild, moderate, severe)    - MILD: No SOB at rest, mild SOB with walking, speaks normally in sentences, can lie down, no retractions, pulse < 100.    - MODERATE: SOB at rest, SOB with minimal exertion and prefers to sit, cannot lie down flat, speaks in phrases, mild retractions, audible wheezing, pulse 100-120.    - SEVERE: Very SOB at rest, speaks in single words, struggling to breathe, sitting hunched forward, retractions, pulse > 120      No  6. FEVER: "Do you have a fever?" If Yes, ask: "What is your temperature, how was it measured, and when did it start?"     na 7. CARDIAC HISTORY: "Do you have any history of heart  disease?" (e.g., heart attack, congestive heart failure)      no 8. LUNG HISTORY: "Do you have any history of lung disease?"  (e.g., pulmonary embolus, asthma, emphysema)     no 9. PE RISK FACTORS: "Do you have a history of blood clots?" (or: recent major surgery, recent prolonged travel, bedridden)     na 10. OTHER SYMPTOMS: "Do you have any other symptoms?" (e.g., runny nose, wheezing, chest pain)       Runny nose  11. PREGNANCY: "Is there any chance you are pregnant?" "When was your last menstrual period?"       na 12. TRAVEL: "Have you traveled out of the country in the last month?" (e.g., travel history, exposures)       na  Protocols used: Cough - Acute Non-Productive-A-AH

## 2021-08-27 ENCOUNTER — Ambulatory Visit (INDEPENDENT_AMBULATORY_CARE_PROVIDER_SITE_OTHER): Payer: BC Managed Care – PPO

## 2021-08-27 ENCOUNTER — Other Ambulatory Visit: Payer: Self-pay

## 2021-08-27 ENCOUNTER — Ambulatory Visit
Admission: EM | Admit: 2021-08-27 | Discharge: 2021-08-27 | Disposition: A | Discharge status: Home / Self Care | Payer: BC Managed Care – PPO | Attending: Urgent Care | Admitting: Urgent Care

## 2021-08-27 ENCOUNTER — Encounter: Payer: Self-pay | Admitting: Emergency Medicine

## 2021-08-27 DIAGNOSIS — R0602 Shortness of breath: Secondary | ICD-10-CM | POA: Diagnosis not present

## 2021-08-27 DIAGNOSIS — R0989 Other specified symptoms and signs involving the circulatory and respiratory systems: Secondary | ICD-10-CM

## 2021-08-27 DIAGNOSIS — Z9189 Other specified personal risk factors, not elsewhere classified: Secondary | ICD-10-CM | POA: Diagnosis not present

## 2021-08-27 DIAGNOSIS — R059 Cough, unspecified: Secondary | ICD-10-CM | POA: Diagnosis not present

## 2021-08-27 DIAGNOSIS — J42 Unspecified chronic bronchitis: Secondary | ICD-10-CM

## 2021-08-27 DIAGNOSIS — B349 Viral infection, unspecified: Secondary | ICD-10-CM

## 2021-08-27 DIAGNOSIS — R053 Chronic cough: Secondary | ICD-10-CM

## 2021-08-27 MED ORDER — PROMETHAZINE-DM 6.25-15 MG/5ML PO SYRP: 5.0000 mL | ORAL_SOLUTION | Freq: Every evening | ORAL | 0 refills | Status: DC | PRN

## 2021-08-27 MED ORDER — PREDNISONE 20 MG PO TABS: ORAL_TABLET | ORAL | 0 refills | Status: DC

## 2021-08-27 NOTE — ED Triage Notes (Signed)
Chest congestion, sore throat, productive cough with green sputum.  Feels tired, runny nose, headache.

## 2021-08-27 NOTE — ED Provider Notes (Signed)
[facility]   MRN: [id number] DOB: [birth year]  Subjective:   Melinda Fox is a 46 y.o. female presenting for 7 to 9-day history of acute onset recurrent productive cough, chest congestion, runny nose, fatigue, malaise.  Has a history of chronic bronchitis but is not using her Symbicort daily.  States that she uses it as needed.  She did a COVID test at home and was negative.  Is not opposed to testing today.  Plans on traveling this weekend and would like amoxicillin so she can travel well.  No current facility-administered medications for this encounter.  Current Outpatient Medications:    fluticasone (FLONASE) 50 MCG/ACT nasal spray, Place 2 sprays into both nostrils daily., Disp: 9.9 mL, Rfl: 0   albuterol (PROAIR HFA) 108 (90 Base) MCG/ACT inhaler, Inhale 2 puffs into the lungs every 4 (four) hours as needed for wheezing or shortness of breath., Disp: 18 g, Rfl: 2   Biotin 10 MG CAPS, Take by mouth., Disp: , Rfl:    budesonide-formoterol (SYMBICORT) 80-4.5 MCG/ACT inhaler, Inhale 2 puffs into the lungs 2 (two) times daily., Disp: 1 each, Rfl: 11   ferrous sulfate 325 (65 FE) MG tablet, Take by mouth., Disp: , Rfl:    hydrOXYzine (VISTARIL) 25 MG capsule, Take 1 capsule (25 mg total) by mouth every 8 (eight) hours as needed for anxiety., Disp: 90 capsule, Rfl: 2   hydrOXYzine (VISTARIL) 25 MG capsule, Take by mouth., Disp: , Rfl:    indomethacin (INDOCIN) 50 MG capsule, Take 1 capsule (50 mg total) by mouth 3 (three) times daily as needed., Disp: 40 capsule, Rfl: 0   Multiple Vitamin (MULTI-VITAMIN) tablet, Take 1 tablet by mouth daily., Disp: , Rfl:    venlafaxine XR (EFFEXOR XR) 75 MG 24 hr capsule, Take 1 capsule (75 mg total) by mouth daily with breakfast., Disp: 90 capsule, Rfl: 3   Allergies  Allergen Reactions   Sulfa Antibiotics Other (See Comments)    Was told that she had it as a child Other reaction(s): Unknown   Spearmint Flavor    Oxycodone Hives     Past Medical History:  Diagnosis Date   Allergy    Anemia    Chronic migraine without aura without status migrainosus, not intractable    Depression    Elevated blood-pressure reading without diagnosis of hypertension    Moderate concentric left ventricular hypertrophy 06/01/2017   Echocardiogram October 2018   Morbid obesity with body mass index (BMI) of 60.0 to 69.9 in adult [facility]) 07/01/2015   Scoliosis    Sleep apnea, obstructive    Thyroid disease    Vasculitis ([place])      Past Surgical History:  Procedure Laterality Date   BARIATRIC SURGERY      Family History  Adopted: Yes  Family history unknown: Yes    Social History   Tobacco Use   Smoking status: Never   Smokeless tobacco: Never  Vaping Use   Vaping Use: Never used  Substance Use Topics   Alcohol use: No    Alcohol/week: 0.0 standard drinks    Comment: once a year   Drug use: No    ROS   Objective:   Vitals: BP 121/80 (BP Location: Right Arm)    Pulse 67    Temp 98.2 F (36.8 C) (Oral)    Resp 18    LMP 08/23/2021 (Exact Date)    SpO2 98%   Physical Exam Constitutional:      General: She is not in  acute distress.    Appearance: Normal appearance. She is well-developed. She is obese. She is not ill-appearing, toxic-appearing or diaphoretic.  HENT:     Head: Normocephalic and atraumatic.     Right Ear: External ear normal.     Left Ear: External ear normal.     Nose: Nose normal.     Mouth/Throat:     Mouth: Mucous membranes are moist.  Eyes:     General: No scleral icterus.       Right eye: No discharge.        Left eye: No discharge.     Extraocular Movements: Extraocular movements intact.     Conjunctiva/sclera: Conjunctivae normal.  Cardiovascular:     Rate and Rhythm: Normal rate and regular rhythm.     Pulses: Normal pulses.     Heart sounds: Normal heart sounds. No murmur heard.   No friction rub. No gallop.  Pulmonary:     Effort: Pulmonary effort is normal. No respiratory  distress.     Breath sounds: Normal breath sounds. No stridor. No wheezing, rhonchi or rales.  Skin:    General: Skin is warm and dry.     Findings: No rash.  Neurological:     Mental Status: She is alert and oriented to person, place, and time.  Psychiatric:        Mood and Affect: Mood normal.        Behavior: Behavior normal.        Thought Content: Thought content normal.        Judgment: Judgment normal.   DG Chest 2 View  Result Date: 08/27/2021 CLINICAL DATA:  cough, shob EXAM: CHEST - 2 VIEW COMPARISON:  May 2021 FINDINGS: The cardiomediastinal silhouette is within normal limits. No pleural effusion. No pneumothorax. No mass or consolidation. No acute osseous abnormality. IMPRESSION: No acute findings in the chest.  No consolidative pneumonia. Electronically Signed   By: Albin Felling M.D.   On: 08/27/2021 09:59    Assessment and Plan :   PDMP not reviewed this encounter.  1. Acute viral syndrome   2. At increased risk of exposure to COVID-19 virus   3. Chronic bronchitis, unspecified chronic bronchitis type ([place])   4. Chronic cough   5. Chest congestion    No signs of an infectious process on the chest x-ray.  With a history of chronic bronchitis recommended an oral prednisone course.  Continue supportive care otherwise.  COVID and flu testing pending. Counseled patient on potential for adverse effects with medications prescribed/recommended today, ER and return-to-clinic precautions discussed, patient verbalized understanding.    Jaynee Eagles, [name] 08/27/21 1006

## 2021-08-28 LAB — COVID-19, FLU A+B NAA
Influenza A, NAA: NOT DETECTED
Influenza B, NAA: NOT DETECTED
SARS-CoV-2, NAA: NOT DETECTED

## 2021-09-16 ENCOUNTER — Encounter: Payer: Self-pay | Admitting: Family Medicine

## 2021-09-18 ENCOUNTER — Ambulatory Visit: Payer: BC Managed Care – PPO | Admitting: Family Medicine

## 2021-09-18 ENCOUNTER — Encounter: Payer: Self-pay | Admitting: Family Medicine

## 2021-09-18 VITALS — BP 130/74 | HR 96 | Temp 98.1°F | Resp 16 | Ht 68.0 in | Wt 380.0 lb

## 2021-09-18 DIAGNOSIS — R052 Subacute cough: Secondary | ICD-10-CM | POA: Diagnosis not present

## 2021-09-18 MED ORDER — AMOXICILLIN-POT CLAVULANATE 875-125 MG PO TABS: 1.0000 | ORAL_TABLET | Freq: Two times a day (BID) | ORAL | 0 refills | Status: DC

## 2021-09-18 NOTE — Telephone Encounter (Signed)
Sorry we have you already scheduled for 09-18-2021 at 1:40. Thank You

## 2021-09-18 NOTE — Progress Notes (Signed)
Name: Melinda Fox   MRN: [id number]    DOB: Mar 23, 1976   Date:09/18/2021       Progress Note  Subjective  Chief Complaint  Cough/Headache  HPI  Cough: she states symptoms started about one month ago, initially post-nasal drainage and sore throat, followed by headaches, fatigue, a productive cough. She had a virtual visit early January and was given tessalon perles , advised to use flonase , she did not improve so she went to urgent care and was given prednisone for bronchitis and cough syrup. She states prednisone did not help, she continues to have a productive cough that yellowish / green in color. She has some wheezing and has a diagnosis of chronic bronchitis on her chart. She states never a smoker, never seen by pulmonologist, but was given a lot of inhalers in the past but states nothing seems to help with symptoms. She states she is tired of coughing and when symptoms lingers the only thing that makes her feel better is antibiotics.   She had leucocytosis on CBC done at [facility] 09/07/21  Patient Active Problem List   Diagnosis Date Noted   OSA on CPAP 01/05/2021   Simple chronic bronchitis ([place]) 03/05/2019   Paroxysmal tachycardia ([facility]) 10/23/2018   Moderate concentric left ventricular hypertrophy 06/01/2017   Iron deficiency anemia 07/01/2015   Migraine without aura and with status migrainosus, not intractable 07/01/2015   Major depression, recurrent, full remission ([place]) 07/01/2015   Subclinical hypothyroidism 07/01/2015   Allergic rhinitis 07/01/2015   Lumbosacral disc disease 07/01/2015   Sciatica neuralgia 07/01/2015   Morbid obesity with body mass index (BMI) of 60.0 to 69.9 in adult [facility]) 07/01/2015    Past Surgical History:  Procedure Laterality Date   SLEEVE GASTROPLASTY  05/25/2021   laporoscopic at [facility] History  Adopted: Yes  Problem Relation Age of Onset   Cancer Mother        breast cancer   Heart failure Father     Social History    Tobacco Use   Smoking status: Never   Smokeless tobacco: Never  Substance Use Topics   Alcohol use: No    Alcohol/week: 0.0 standard drinks    Comment: once a year     Current Outpatient Medications:    amoxicillin-clavulanate (AUGMENTIN) 875-125 MG tablet, Take 1 tablet by mouth 2 (two) times daily., Disp: 14 tablet, Rfl: 0   Biotin 10 MG CAPS, Take by mouth., Disp: , Rfl:    ferrous sulfate 325 (65 FE) MG tablet, Take by mouth., Disp: , Rfl:    hydrOXYzine (VISTARIL) 25 MG capsule, Take 1 capsule (25 mg total) by mouth every 8 (eight) hours as needed for anxiety., Disp: 90 capsule, Rfl: 2   indomethacin (INDOCIN) 50 MG capsule, Take 1 capsule (50 mg total) by mouth 3 (three) times daily as needed., Disp: 40 capsule, Rfl: 0   Multiple Vitamin (MULTI-VITAMIN) tablet, Take 1 tablet by mouth daily., Disp: , Rfl:    venlafaxine XR (EFFEXOR XR) 75 MG 24 hr capsule, Take 1 capsule (75 mg total) by mouth daily with breakfast., Disp: 90 capsule, Rfl: 3  Allergies  Allergen Reactions   Sulfa Antibiotics Other (See Comments)    Was told that she had it as a child Other reaction(s): Unknown   Spearmint Flavor     Smell- causes migraines   Oxycodone Hives    I personally reviewed active problem list, medication list, allergies, family history, social history, health maintenance with the  patient/caregiver today.   ROS  Ten systems reviewed and is negative except as mentioned in HPI  Weight loss, had bariatric surgery 10/22   Objective  Vitals:   09/18/21 1334  BP: 130/74  Pulse: 96  Resp: 16  Temp: 98.1 F (36.7 C)  SpO2: 97%  Weight: (!) 380 lb (172.4 kg)  Height: 5\' 8"  (1.727 m)    Body mass index is 57.78 kg/m.  Physical Exam  Constitutional: Patient appears well-developed and well-nourished. Obese  No distress.  HEENT: head atraumatic, normocephalic, pupils equal and reactive to light, ear normal TM bilaterally , neck supple, throat within normal limits, mild  tenderness on frontal and maxillary sinus Cardiovascular: Normal rate, regular rhythm and normal heart sounds.  No murmur heard. No BLE edema. Pulmonary/Chest: Effort normal and breath sounds normal. No respiratory distress. Abdominal: Soft.  There is no tenderness. Psychiatric: Patient has a normal mood and affect. behavior is normal. Judgment and thought content normal.   Recent Results (from the past 2160 hour(s))  Covid-19, Flu A+B ([facility])     Status: None   Collection Time: 08/27/21  9:28 AM   Specimen: Nasopharyngeal   Naso  Result Value Ref Range   SARS-CoV-2, NAA Not Detected Not Detected   Influenza A, NAA Not Detected Not Detected   Influenza B, NAA Not Detected Not Detected   Test Information: Comment     Comment: This nucleic acid amplification test was developed and its performance characteristics determined by [employer]. Nucleic acid amplification tests include RT-PCR and TMA. This test has not been FDA cleared or approved. This test has been authorized by FDA under an Emergency Use Authorization (EUA). This test is only authorized for the duration of time the declaration that circumstances exist justifying the authorization of the emergency use of in vitro diagnostic tests for detection of SARS-CoV-2 virus and/or diagnosis of COVID-19 infection under section 564(b)(1) of the Act, 21 U.S.C. PT:2852782) (1), unless the authorization is terminated or revoked sooner. When diagnostic testing is negative, the possibility of a false negative result should be considered in the context of a patient's recent exposures and the presence of clinical signs and symptoms consistent with COVID-19. An individual without symptoms of COVID-19 and who is not shedding SARS-CoV-2 virus wo uld expect to have a negative (not detected) result in this assay.      PHQ2/9: Depression screen [facility] 2/9 09/18/2021 03/04/2021 12/28/2019 10/10/2019 08/31/2019  Decreased Interest 0 0 2 1 0   Down, Depressed, Hopeless 0 0 2 0 3  PHQ - 2 Score 0 0 4 1 3   Altered sleeping 0 0 3 2 3   Tired, decreased energy 0 0 3 3 3   Change in appetite 0 0 2 1 3   Feeling bad or failure about yourself  0 0 1 0 1  Trouble concentrating 0 0 1 3 3   Moving slowly or fidgety/restless 0 0 1 2 0  Suicidal thoughts 0 0 0 0 1  PHQ-9 Score 0 0 15 12 17   Difficult doing work/chores - Not difficult at all Somewhat difficult Very difficult Somewhat difficult  Some recent data might be hidden    phq 9 is negative   Fall Risk: Fall Risk  09/18/2021 03/04/2021 12/28/2019 10/10/2019 08/31/2019  Falls in the past year? 0 0 0 0 0  Number falls in past yr: 0 0 0 0 0  Injury with Fall? 0 0 0 0 0  Risk for fall due to : No Fall Risks - - - -  Follow up Falls prevention discussed - Falls evaluation completed - -      Functional Status Survey: Is the patient deaf or have difficulty hearing?: No Does the patient have difficulty seeing, even when wearing glasses/contacts?: No Does the patient have difficulty concentrating, remembering, or making decisions?: No Does the patient have difficulty walking or climbing stairs?: No Does the patient have difficulty dressing or bathing?: No Does the patient have difficulty doing errands alone such as visiting a doctor's office or shopping?: No    Assessment & Plan  1. Subacute cough  - amoxicillin-clavulanate (AUGMENTIN) 875-125 MG tablet; Take 1 tablet by mouth 2 (two) times daily.  Dispense: 14 tablet; Refill: 0

## 2021-10-03 ENCOUNTER — Emergency Department (HOSPITAL_COMMUNITY): Payer: BC Managed Care – PPO

## 2021-10-03 ENCOUNTER — Emergency Department (HOSPITAL_COMMUNITY)
Admission: EM | Admit: 2021-10-03 | Discharge: 2021-10-03 | Disposition: A | Discharge status: Other Acute Inpatient Hospital | Payer: BC Managed Care – PPO | Attending: Emergency Medicine | Admitting: Emergency Medicine

## 2021-10-03 ENCOUNTER — Other Ambulatory Visit: Payer: Self-pay

## 2021-10-03 ENCOUNTER — Encounter (HOSPITAL_COMMUNITY): Payer: Self-pay

## 2021-10-03 DIAGNOSIS — R1011 Right upper quadrant pain: Secondary | ICD-10-CM | POA: Insufficient documentation

## 2021-10-03 DIAGNOSIS — Z20822 Contact with and (suspected) exposure to covid-19: Secondary | ICD-10-CM | POA: Diagnosis not present

## 2021-10-03 DIAGNOSIS — R1010 Upper abdominal pain, unspecified: Secondary | ICD-10-CM

## 2021-10-03 DIAGNOSIS — T8149XA Infection following a procedure, other surgical site, initial encounter: Secondary | ICD-10-CM | POA: Diagnosis not present

## 2021-10-03 DIAGNOSIS — E871 Hypo-osmolality and hyponatremia: Secondary | ICD-10-CM

## 2021-10-03 DIAGNOSIS — R0602 Shortness of breath: Secondary | ICD-10-CM | POA: Diagnosis not present

## 2021-10-03 DIAGNOSIS — R1013 Epigastric pain: Secondary | ICD-10-CM | POA: Insufficient documentation

## 2021-10-03 DIAGNOSIS — K659 Peritonitis, unspecified: Secondary | ICD-10-CM

## 2021-10-03 DIAGNOSIS — T8140XA Infection following a procedure, unspecified, initial encounter: Secondary | ICD-10-CM

## 2021-10-03 DIAGNOSIS — R059 Cough, unspecified: Secondary | ICD-10-CM | POA: Diagnosis not present

## 2021-10-03 DIAGNOSIS — R188 Other ascites: Secondary | ICD-10-CM | POA: Insufficient documentation

## 2021-10-03 DIAGNOSIS — R509 Fever, unspecified: Secondary | ICD-10-CM | POA: Diagnosis not present

## 2021-10-03 DIAGNOSIS — D72829 Elevated white blood cell count, unspecified: Secondary | ICD-10-CM | POA: Diagnosis not present

## 2021-10-03 LAB — RESP PANEL BY RT-PCR (FLU A&B, COVID) ARPGX2
Influenza A by PCR: NEGATIVE
Influenza B by PCR: NEGATIVE
SARS Coronavirus 2 by RT PCR: NEGATIVE

## 2021-10-03 LAB — COMPREHENSIVE METABOLIC PANEL
ALT: 18 U/L (ref 0–44)
AST: 20 U/L (ref 15–41)
Albumin: 3.7 g/dL (ref 3.5–5.0)
Alkaline Phosphatase: 89 U/L (ref 38–126)
Anion gap: 9 (ref 5–15)
BUN: 11 mg/dL (ref 6–20)
CO2: 21 mmol/L — ABNORMAL LOW (ref 22–32)
Calcium: 9.3 mg/dL (ref 8.9–10.3)
Chloride: 104 mmol/L (ref 98–111)
Creatinine, Ser: 0.65 mg/dL (ref 0.44–1.00)
GFR, Estimated: >60 mL/min (ref >60)
Glucose, Bld: 113 mg/dL — ABNORMAL HIGH (ref 70–99)
Potassium: 3.9 mmol/L (ref 3.5–5.1)
Sodium: 134 mmol/L — ABNORMAL LOW (ref 135–145)
Total Bilirubin: 0.3 mg/dL (ref 0.3–1.2)
Total Protein: 7 g/dL (ref 6.5–8.1)

## 2021-10-03 LAB — URINALYSIS, ROUTINE W REFLEX MICROSCOPIC
Bilirubin Urine: NEGATIVE
Glucose, UA: NEGATIVE mg/dL
Ketones, ur: NEGATIVE mg/dL
Leukocytes,Ua: NEGATIVE
Nitrite: NEGATIVE
Protein, ur: NEGATIVE mg/dL
Specific Gravity, Urine: 1.016 (ref 1.005–1.030)
pH: 8 (ref 5.0–8.0)

## 2021-10-03 LAB — CBC WITH DIFFERENTIAL/PLATELET
Abs Immature Granulocytes: 0.09 10*3/uL — ABNORMAL HIGH (ref 0.00–0.07)
Basophils Absolute: 0.1 10*3/uL (ref 0.0–0.1)
Basophils Relative: 0 %
Eosinophils Absolute: 0 10*3/uL (ref 0.0–0.5)
Eosinophils Relative: 0 %
HCT: 40.5 % (ref 36.0–46.0)
Hemoglobin: 12.8 g/dL (ref 12.0–15.0)
Immature Granulocytes: 1 %
Lymphocytes Relative: 7 %
Lymphs Abs: 1.4 10*3/uL (ref 0.7–4.0)
MCH: 26.8 pg (ref 26.0–34.0)
MCHC: 31.6 g/dL (ref 30.0–36.0)
MCV: 84.7 fL (ref 80.0–100.0)
Monocytes Absolute: 1.5 10*3/uL — ABNORMAL HIGH (ref 0.1–1.0)
Monocytes Relative: 8 %
Neutro Abs: 15.4 10*3/uL — ABNORMAL HIGH (ref 1.7–7.7)
Neutrophils Relative %: 84 %
Platelets: 317 10*3/uL (ref 150–400)
RBC: 4.78 MIL/uL (ref 3.87–5.11)
RDW: 15.6 % — ABNORMAL HIGH (ref 11.5–15.5)
WBC: 18.4 10*3/uL — ABNORMAL HIGH (ref 4.0–10.5)
nRBC: 0 % (ref 0.0–0.2)

## 2021-10-03 LAB — LACTIC ACID, PLASMA
Lactic Acid, Venous: 1.1 mmol/L (ref 0.5–1.9)
Lactic Acid, Venous: 1.4 mmol/L (ref 0.5–1.9)

## 2021-10-03 LAB — LIPASE, BLOOD: Lipase: 24 U/L (ref 11–51)

## 2021-10-03 MED ORDER — LACTATED RINGERS IV BOLUS
1000.0000 mL | Freq: Once | INTRAVENOUS | Status: AC
  Administered 2021-10-03: 1000 mL via INTRAVENOUS

## 2021-10-03 MED ORDER — PIPERACILLIN-TAZOBACTAM 3.375 G IVPB
3.3750 g | Freq: Three times a day (TID) | INTRAVENOUS | Status: DC
  Administered 2021-10-03: 3.375 g via INTRAVENOUS
  Filled 2021-10-03: qty 50

## 2021-10-03 MED ORDER — IOHEXOL 300 MG/ML  SOLN
75.0000 mL | Freq: Once | INTRAMUSCULAR | Status: AC | PRN
  Administered 2021-10-03: 75 mL via INTRAVENOUS

## 2021-10-03 MED ORDER — KETOROLAC TROMETHAMINE 15 MG/ML IJ SOLN
15.0000 mg | Freq: Once | INTRAMUSCULAR | Status: AC
  Administered 2021-10-03: 15 mg via INTRAVENOUS
  Filled 2021-10-03: qty 1

## 2021-10-03 MED ORDER — ONDANSETRON HCL 4 MG/2ML IJ SOLN
4.0000 mg | Freq: Once | INTRAMUSCULAR | Status: AC
  Administered 2021-10-03: 4 mg via INTRAVENOUS
  Filled 2021-10-03: qty 2

## 2021-10-03 MED ORDER — ACETAMINOPHEN 325 MG PO TABS
650.0000 mg | ORAL_TABLET | Freq: Once | ORAL | Status: AC
  Administered 2021-10-03: 650 mg via ORAL
  Filled 2021-10-03: qty 2

## 2021-10-03 MED ORDER — LACTATED RINGERS IV BOLUS: 1000.0000 mL | Freq: Once | INTRAVENOUS | Status: DC

## 2021-10-03 NOTE — ED Provider Notes (Signed)
[facility] EMERGENCY DEPARTMENT Provider Note   CSN: [id number] Arrival date & time: 10/03/21  0123     History  Chief Complaint  Patient presents with   Shortness of Breath    Melinda Fox is a 46 y.o. female.  The history is provided by the patient.  Shortness of Breath She has history of depression, morbid obesity status post gastric sleeve surgery and comes in with epigastric pain which started yesterday.  Pain radiates through to the back.  It is moderately severe and she rates it at 8/10.  There is associated nausea but no vomiting.  She also had chills at home with fever to 102.  She did take a dose of acetaminophen and temperature has come down.  She denies rhinorrhea, sore throat, cough.  She denies any diarrhea.  She has had urinary urgency but no frequency and no dysuria.  She denies any sick contacts.  She is concerned about possible biliary tract disease.   Home Medications Prior to Admission medications   Medication Sig Start Date End Date Taking? Authorizing Provider  amoxicillin-clavulanate (AUGMENTIN) 875-125 MG tablet Take 1 tablet by mouth 2 (two) times daily. 09/18/21   Alba Cory, MD  Biotin 10 MG CAPS Take by mouth.    [provider]  ferrous sulfate 325 (65 FE) MG tablet Take by mouth.    [provider]  hydrOXYzine (VISTARIL) 25 MG capsule Take 1 capsule (25 mg total) by mouth every 8 (eight) hours as needed for anxiety. 08/26/20   Danelle Berry, [name]  indomethacin (INDOCIN) 50 MG capsule Take 1 capsule (50 mg total) by mouth 3 (three) times daily as needed. 01/01/20   Danelle Berry, [name]  Multiple Vitamin (MULTI-VITAMIN) tablet Take 1 tablet by mouth daily.    [provider]  venlafaxine XR (EFFEXOR XR) 75 MG 24 hr capsule Take 1 capsule (75 mg total) by mouth daily with breakfast. 03/04/21   Danelle Berry, [name]      Allergies    Sulfa antibiotics, Spearmint flavor, and Oxycodone    Review of Systems   Review of Systems   Respiratory:  Positive for shortness of breath.    Physical Exam Updated Vital Signs BP 139/66 (BP Location: Right Arm)    Pulse 96    Temp (!) 100.6 F (38.1 C) (Oral)    Resp 18    Ht 5\' 8"  (1.727 m)    Wt (!) 170.1 kg    LMP 09/28/2021    SpO2 97%    BMI 57.02 kg/m  Physical Exam Vitals and nursing note reviewed.  46 year old female, resting comfortably and in no acute distress. Vital signs are significant for elevated temperature. Oxygen saturation is 97%, which is normal. Head is normocephalic and atraumatic. PERRLA, EOMI. Oropharynx is clear. Neck is nontender and supple without adenopathy or JVD. Back is nontender and there is no CVA tenderness. Lungs are clear without rales, wheezes, or rhonchi. Chest is nontender. Heart has regular rate and rhythm without murmur. Abdomen is soft, flat, with moderate epigastric and right upper quadrant tenderness.  There is no rebound or guarding.  There are no masses or hepatosplenomegaly and peristalsis is hypoactive. Extremities have no cyanosis or edema, full range of motion is present. Skin is warm and dry without rash. Neurologic: Mental status is normal, cranial nerves are intact, there are no motor or sensory deficits.  ED Results / Procedures / Treatments   Labs (all labs ordered are listed, but only abnormal results  are displayed) Labs Reviewed  CULTURE, BLOOD (ROUTINE X 2)  CULTURE, BLOOD (ROUTINE X 2)  RESP PANEL BY RT-PCR (FLU A&B, COVID) ARPGX2  COMPREHENSIVE METABOLIC PANEL  CBC WITH DIFFERENTIAL/PLATELET  LIPASE, BLOOD  LACTIC ACID, PLASMA  LACTIC ACID, PLASMA  URINALYSIS, ROUTINE W REFLEX MICROSCOPIC    EKG None  Radiology No results found.  Procedures Procedures  Cardiac monitor, per my interpretation, shows normal sinus rhythm without ectopy.  Medications Ordered in ED Medications  ondansetron (ZOFRAN) injection 4 mg (has no administration in time range)  acetaminophen (TYLENOL) tablet 650 mg (has no  administration in time range)  lactated ringers bolus 1,000 mL (has no administration in time range)    ED Course/ Medical Decision Making/ A&P                           Medical Decision Making Amount and/or Complexity of Data Reviewed Labs: ordered. Radiology: ordered.  Risk OTC drugs. Prescription drug management.   Epigastric and right upper quadrant pain concerning for biliary colic.  Consider pancreatitis, GERD, peptic ulcer disease, diverticulitis.  In the setting of fever, I am much more concerned about cholecystitis.  Old records are reviewed, showing laparoscopic gastric sleeve surgery on 05/25/2021 at [facility].  Labs show mild hyponatremia, not felt to be clinically significant, and moderate leukocytosis with left shift.  CT of abdomen and pelvis shows some inflammatory changes with fluid collection in the region of her sleeve gastrectomy.  I have independently viewed the images, and personally discussed the findings with the radiologist with concern for possible dehiscence of the suture line.  Radiologist is recommending repeat CT scan following oral contrast to look for extravasation of contrast.  Case is signed out to Dr. Wallace Cullens.        Final Clinical Impression(s) / ED Diagnoses Final diagnoses:  Upper abdominal pain  Hyponatremia    Rx / DC Orders ED Discharge Orders     None         Dione Booze, MD 10/03/21 [phone]

## 2021-10-03 NOTE — ED Provider Notes (Signed)
[facility] EMERGENCY DEPARTMENT Provider Note   CSN: [id number] Arrival date & time: 10/03/21  0123     History  Chief Complaint  Patient presents with   Shortness of Breath    Melinda Fox is a 46 y.o. female.  This is a 46 y.o. female  with significant medical history as below, including  s/p gastric sleeve with  Emelda Brothers, MD ([facility]) 05/25/21, obesity, depression, sleep apnea who presents to the ED with complaint of abdominal pain.  Onset of pain yesterday.  Midepigastric/right upper quadrant.  Sharp, stabbing, aching.  Associated with fever.  Mild, nonproductive cough.  Nausea without emesis.  Reduced p.o. intake.  Feeling lightheaded intermittently, generalized malaise.  Febrile at home Tmax 10 2-1 03.  Improved with p.o. Tylenol.  Associated with chills.  No rigors.  No sick contacts, recent travel or suspicious oral intake.    Past Medical History: No date: Allergy No date: Anemia No date: Chronic migraine without aura without status migrainosus,  not intractable No date: Depression No date: Elevated blood-pressure reading without diagnosis of  hypertension 06/01/2017: Moderate concentric left ventricular hypertrophy     Comment:  Echocardiogram October 2018 07/01/2015: Morbid obesity with body mass index (BMI) of 60.0 to 69.9  in adult [name]) No date: Scoliosis No date: Sleep apnea, obstructive No date: Thyroid disease No date: Vasculitis [facility])  Past Surgical History: 05/25/2021: SLEEVE GASTROPLASTY     Comment:  laporoscopic at [facility]    The history is provided by the patient. No language interpreter was used.  Shortness of Breath Associated symptoms: abdominal pain, cough and fever   Associated symptoms: no chest pain, no headaches and no rash       Home Medications Prior to Admission medications   Medication Sig Start Date End Date Taking? Authorizing Provider  amoxicillin-clavulanate (AUGMENTIN) 875-125 MG tablet Take 1 tablet  by mouth 2 (two) times daily. 09/18/21   Steele Sizer, MD  Biotin 10 MG CAPS Take by mouth.    [provider]  ferrous sulfate 325 (65 FE) MG tablet Take by mouth.    [provider]  hydrOXYzine (VISTARIL) 25 MG capsule Take 1 capsule (25 mg total) by mouth every 8 (eight) hours as needed for anxiety. 08/26/20   Delsa Grana, [name]  indomethacin (INDOCIN) 50 MG capsule Take 1 capsule (50 mg total) by mouth 3 (three) times daily as needed. 01/01/20   Delsa Grana, [name]  Multiple Vitamin (MULTI-VITAMIN) tablet Take 1 tablet by mouth daily.    [provider]  venlafaxine XR (EFFEXOR XR) 75 MG 24 hr capsule Take 1 capsule (75 mg total) by mouth daily with breakfast. 03/04/21   Delsa Grana, [name]      Allergies    Sulfa antibiotics, Spearmint flavor, and Oxycodone    Review of Systems   Review of Systems  Constitutional:  Positive for chills and fever. Negative for activity change.  HENT:  Negative for facial swelling and trouble swallowing.   Eyes:  Negative for discharge and redness.  Respiratory:  Positive for cough.   Cardiovascular:  Negative for chest pain and palpitations.  Gastrointestinal:  Positive for abdominal pain and nausea.  Genitourinary:  Negative for dysuria and flank pain.  Musculoskeletal:  Positive for arthralgias. Negative for back pain and gait problem.  Skin:  Negative for pallor and rash.  Neurological:  Negative for syncope and headaches.   Physical Exam Updated Vital Signs BP 108/67    Pulse 86    Temp  98.8 F (37.1 C) (Oral)    Resp 18    Ht 5\' 8"  (1.727 m)    Wt (!) 170.1 kg    LMP 09/28/2021    SpO2 94%    BMI 57.02 kg/m  Physical Exam Vitals and nursing note reviewed.  Constitutional:      General: She is not in acute distress.    Appearance: Normal appearance. She is well-developed. She is obese. She is not ill-appearing.  HENT:     Head: Normocephalic and atraumatic.     Right Ear: External ear normal.     Left Ear: External  ear normal.     Nose: Nose normal.     Mouth/Throat:     Mouth: Mucous membranes are moist.  Eyes:     General: No scleral icterus.       Right eye: No discharge.        Left eye: No discharge.  Cardiovascular:     Rate and Rhythm: Normal rate and regular rhythm.     Pulses: Normal pulses.     Heart sounds: Normal heart sounds.  Pulmonary:     Effort: Pulmonary effort is normal. No respiratory distress.     Breath sounds: Normal breath sounds.  Abdominal:     General: Abdomen is flat.     Palpations: Abdomen is soft.     Tenderness: There is abdominal tenderness in the right upper quadrant and epigastric area. There is no guarding or rebound.       Comments: Not peritoneal  Musculoskeletal:        General: Normal range of motion.     Cervical back: Normal range of motion.     Right lower leg: No edema.     Left lower leg: No edema.  Skin:    General: Skin is warm and dry.     Capillary Refill: Capillary refill takes less than 2 seconds.  Neurological:     Mental Status: She is alert and oriented to person, place, and time.     GCS: GCS eye subscore is 4. GCS verbal subscore is 5. GCS motor subscore is 6.  Psychiatric:        Mood and Affect: Mood normal.        Behavior: Behavior normal.    ED Results / Procedures / Treatments   Labs (all labs ordered are listed, but only abnormal results are displayed) Labs Reviewed  COMPREHENSIVE METABOLIC PANEL - Abnormal; Notable for the following components:      Result Value   Sodium 134 (*)    CO2 21 (*)    Glucose, Bld 113 (*)    All other components within normal limits  CBC WITH DIFFERENTIAL/PLATELET - Abnormal; Notable for the following components:   WBC 18.4 (*)    RDW 15.6 (*)    Neutro Abs 15.4 (*)    Monocytes Absolute 1.5 (*)    Abs Immature Granulocytes 0.09 (*)    All other components within normal limits  URINALYSIS, ROUTINE W REFLEX MICROSCOPIC - Abnormal; Notable for the following components:   Hgb urine  dipstick MODERATE (*)    Bacteria, UA RARE (*)    All other components within normal limits  CULTURE, BLOOD (ROUTINE X 2)  CULTURE, BLOOD (ROUTINE X 2)  RESP PANEL BY RT-PCR (FLU A&B, COVID) ARPGX2  LIPASE, BLOOD  LACTIC ACID, PLASMA  LACTIC ACID, PLASMA    EKG None  Radiology CT ABDOMEN WO CONTRAST  Result Date: 10/03/2021 CLINICAL DATA:  Evaluate for suture line dehiscence status post sleeve gastrectomy. EXAM: CT ABDOMEN WITHOUT CONTRAST TECHNIQUE: Multidetector CT imaging of the abdomen was performed following the standard protocol without IV contrast. RADIATION DOSE REDUCTION: This exam was performed according to the departmental dose-optimization program which includes automated exposure control, adjustment of the mA and/or kV according to patient size and/or use of iterative reconstruction technique. COMPARISON:  CT AP from earlier today. FINDINGS: Lower chest: Again seen is a left pleural effusion with overlying soft tissue atelectasis. Hepatobiliary: No focal liver abnormality is seen. No gallstones, gallbladder wall thickening, or biliary dilatation. Pancreas: No main duct dilatation, inflammation or mass. Spleen: Mild splenomegaly. Adrenals/Urinary Tract: Normal adrenal glands. No kidney mass or hydronephrosis identified. Stomach/Bowel: Postoperative changes from sleeve gastrectomy are again noted. Adjacent to the suture line, just below the GE junction there is a focal fluid density structure measuring 4.0 x 2.7 by 2.8 cm. There is surrounding inflammatory fat stranding identified which extends into the left adrenal bed and up to the anteromedial margin of the spleen. No frank extravasation of oral contrast material identified from the suture line. No dilated loops of large or small bowel. Vascular/Lymphatic: No significant vascular findings are present. No enlarged abdominal or pelvic lymph nodes. Other: None Musculoskeletal: No acute or significant osseous findings. IMPRESSION: 1.  Postoperative changes from sleeve gastrectomy are again noted. 2. Adjacent to the suture line, just below the GE junction there is a focal fluid collection with surrounding inflammatory fat stranding measuring 4.0 x 2.7 x 2.8 cm. No frank extravasation of oral contrast material identified at this time. Findings remain worrisome for inflammatory/infectious process perhaps secondary to suture line dehiscence. Surgical consultation is recommended. 3. Persistent left pleural effusion with overlying soft tissue atelectasis. Electronically Signed   By: Kerby Moors M.D.   On: 10/03/2021 09:35   CT ABDOMEN PELVIS W CONTRAST  Result Date: 10/03/2021 CLINICAL DATA:  Evaluate for pancreatitis. Complains of pressure and epigastric region resulting in shortness of breath. EXAM: CT ABDOMEN AND PELVIS WITH CONTRAST TECHNIQUE: Multidetector CT imaging of the abdomen and pelvis was performed using the standard protocol following bolus administration of intravenous contrast. RADIATION DOSE REDUCTION: This exam was performed according to the departmental dose-optimization program which includes automated exposure control, adjustment of the mA and/or kV according to patient size and/or use of iterative reconstruction technique. CONTRAST:  41mL OMNIPAQUE IOHEXOL 300 MG/ML  SOLN COMPARISON:  None. FINDINGS: Lower chest: There is a small left pleural effusion. Overlying atelectasis identified. Hepatobiliary: No focal liver abnormality is seen. No gallstones, gallbladder wall thickening, or biliary dilatation. Pancreas: Unremarkable. No pancreatic ductal dilatation or surrounding inflammatory changes. Spleen: The spleen is enlarged measuring 13.2 cm in length. Adrenals/Urinary Tract: Normal adrenal glands. No mass, nephrolithiasis or hydronephrosis identified. No hydroureter or ureteral lithiasis. Urinary bladder is unremarkable. Stomach/Bowel: Postoperative changes from previous left previous sleeve gastrectomy identified, image  17/2 through image 34/2. Adjacent to the anastomotic suture chain, just below the GE junction and along the undersurface of the left hemidiaphragm there is a focal fluid collection with surrounding soft tissue stranding measuring 3.7 x 4.6 by 3.6 cm (volume = 32 cm^3). This extends up to the anteromedial surface of the spleen. There is soft tissue stranding which extends up to the left adrenal gland, image 26/2. No pathologic dilatation of the large or small bowel loops. No bowel wall thickening, inflammation, or distension. Vascular/Lymphatic: No significant vascular findings are present. No enlarged abdominal or pelvic lymph nodes. Reproductive: Uterus and bilateral  adnexa are unremarkable. Other: No free fluid or fluid collections identified within the remaining portions of the abdomen or pelvis. No signs of pneumoperitoneum. Musculoskeletal: No acute or significant osseous findings. IMPRESSION: 1. Postoperative changes from previous sleeve gastrectomy identified. 2. There is a focal fluid collection within the left upper quadrant of the abdomen along the gastric suture chain which measures approximately 32 cc. There is surrounding soft tissue stranding. Without prior imaging for comparison this is indeterminate. However, in a patient presenting with acute epigastric pain findings are suspicious for anastomotic dehiscence and/or abscess formation. Recommend further evaluation with repeat CT of the abdomen following ingestion of oral contrast material to assess for extraluminal contrast extravasation. 3. Splenomegaly. 4. Small left pleural effusion with overlying atelectasis, this may be reactive secondary to left upper quadrant inflammatory change. Electronically Signed   By: Kerby Moors M.D.   On: 10/03/2021 07:32    Procedures .Critical Care Performed by: Jeanell Sparrow, DO Authorized by: Jeanell Sparrow, DO   Critical care provider statement:    Critical care time (minutes):  70   Critical care time  was exclusive of:  Separately billable procedures and treating other patients   Critical care was necessary to treat or prevent imminent or life-threatening deterioration of the following conditions:  Sepsis   Critical care was time spent personally by me on the following activities:  Development of treatment plan with patient or surrogate, discussions with consultants, evaluation of patient's response to treatment, examination of patient, ordering and review of laboratory studies, ordering and review of radiographic studies, ordering and performing treatments and interventions, pulse oximetry, re-evaluation of patient's condition, review of old charts and obtaining history from patient or surrogate   Care discussed with comment:  Admitted team at [facility]    Medications Ordered in ED Medications  piperacillin-tazobactam (ZOSYN) IVPB 3.375 g (0 g Intravenous Stopped 10/03/21 1150)  ondansetron (ZOFRAN) injection 4 mg (4 mg Intravenous Given 10/03/21 0517)  acetaminophen (TYLENOL) tablet 650 mg (650 mg Oral Given 10/03/21 0517)  lactated ringers bolus 1,000 mL (0 mLs Intravenous Stopped 10/03/21 0747)  iohexol (OMNIPAQUE) 300 MG/ML solution 75 mL (75 mLs Intravenous Contrast Given 10/03/21 0654)  lactated ringers bolus 1,000 mL (1,000 mLs Intravenous Bolus 10/03/21 0808)  ketorolac (TORADOL) 15 MG/ML injection 15 mg (15 mg Intravenous Given 10/03/21 1150)    ED Course/ Medical Decision Making/ A&P Clinical Course as of 10/03/21 1403  Sat Oct 03, 2021  1219 D/w gen surg Dr Kenn File who recommends transfer to [facility] for continuity of care, patient likely acquired IR drain; IR not available at this facility.  [SG]  1230 Discussed with transfer center nurse at [facility], they will review images and d/w with surgery, awaiting callback [SG]  [place] [SG]    Clinical Course User Index [SG] Jeanell Sparrow, DO                           Medical Decision Making Amount and/or  Complexity of Data Reviewed External Data Reviewed: labs, radiology and notes. Labs: ordered. Decision-making details documented in ED Course. Radiology: ordered. Decision-making details documented in ED Course.  Risk OTC drugs. Prescription drug management. Decision regarding hospitalization.  Critical Care Total time providing critical care: 30-74 minutes   CC: Abdominal pain, nausea  This patient presents to the Emergency Department for the above complaint. This involves an extensive number of treatment options and is a complaint that  carries with it a high risk of complications and morbidity. Vital signs were reviewed. Serious etiologies considered.  Record review:  Previous records obtained and reviewed   Medical and surgical history as noted above.   Work up as above, notable for:  Labs & imaging results that were available during my care of the patient were reviewed by me and considered in my medical decision making.   CT imaging reviewed, demonstrates possible fluid collection versus abscess adjacent to surgical site at GE junction.  No noted extravasation of oral contrast  Cardiac monitoring reviewed and interpreted personally which shows normal sinus rhythm  Social determinants of health include - N/a  Management: Broad-spectrum antibiotics started after obtaining blood cultures.  Zosyn.  She has leukocytosis 18.4, febrile.  Likely infection intra-abdominal.  Hemodynamically stable.  Patient meets criteria for sepsis.  IV fluids in process.  Code Sepsis paged.  Personally discussed patient care with consultant; see above.  Discussed with general surgery AP. Recommends txfr for continuity of care, may require IR drain.  Discussed with [facility] nurse navigator, they have discussed with surgery.  Recommend transfer to their facility for continuity of care, surgical management. Dr Thomasena Edis accepting.   Plan to transfer patient to [facility] for further evaluation and  management of postop infection, likely intra-abdominal abscess. Reason for transfer: Continuity of care. Discussed findings and plan with patient or guardian. Patient or guardian agrees with plan and transfer. Discussed the patient with accepting facility transfer team and with accepting physician, Dr. Edison Nasuti (colorectal surgery), updated on patient condition. EMTALA form completed and signed. Patient will be sent with paperwork and imaging, if done, from today's visit.    This chart was dictated using voice recognition software.  Despite best efforts to proofread,  errors can occur which can change the documentation meaning.          Final Clinical Impression(s) / ED Diagnoses Final diagnoses:  Upper abdominal pain  Hyponatremia  Abdominal infection ([place])  Postoperative infection, unspecified type, initial encounter    Rx / DC Orders ED Discharge Orders     None         Jeanell Sparrow, DO 10/03/21 1315

## 2021-10-03 NOTE — ED Notes (Signed)
Repost given to [facility]. ETA 15-20 minutes, patient made aware.

## 2021-10-03 NOTE — Progress Notes (Signed)
Pharmacy Antibiotic Note  Melinda Fox is a 46 y.o. female admitted on 10/03/2021 with IAI.  Pharmacy has been consulted for Zosyn dosing.  Plan: Zosyn 3.375g IV q8h (4 hour infusion).  Height: 5\' 8"  (172.7 cm) Weight: (!) 170.1 kg (375 lb) IBW/kg (Calculated) : 63.9  Temp (24hrs), Avg:99.7 F (37.6 C), Min:98.8 F (37.1 C), Max:100.6 F (38.1 C)  Recent Labs  Lab 10/03/21 0428 10/03/21 0615 10/03/21 0856  WBC 18.4*  --   --   CREATININE 0.65  --   --   LATICACIDVEN  --  1.1 1.4    Estimated Creatinine Clearance: 150.7 mL/min (by C-G formula based on SCr of 0.65 mg/dL).    Allergies  Allergen Reactions   Sulfa Antibiotics Other (See Comments)    Was told that she had it as a child Other reaction(s): Unknown   Spearmint Flavor     Smell- causes migraines   Oxycodone Hives    Antimicrobials this admission: Zosyn  2/18 >>    Dose adjustments this admission:   Microbiology results: 2/18 BCx: pending  UCx:    Sputum:    MRSA PCR:   Thank you for allowing pharmacy to be a part of this patients care.  Hart Robinsons A 10/03/2021 11:11 AM

## 2021-10-03 NOTE — ED Notes (Signed)
Pt BP low the past two cycles, last reading was 95/63. MD made aware, bolus of LR ordered

## 2021-10-03 NOTE — Progress Notes (Signed)
[facility] following for sepsis protocol. Lactic acids within protocol limits not compelling full 20ml/Kg resuscitation. Pt to tx to [facility] & protocol halted.

## 2021-10-03 NOTE — ED Triage Notes (Signed)
Started today at 12 after eating lunch, pressure in the epigastric region that caused her to be short of breath. Does have a little cough and has been running temperature. She is concerned with gallbladder

## 2021-10-08 LAB — CULTURE, BLOOD (ROUTINE X 2)
Culture: NO GROWTH
Culture: NO GROWTH
Special Requests: ADEQUATE

## 2021-10-13 ENCOUNTER — Encounter: Payer: Self-pay | Admitting: Oncology

## 2021-12-11 LAB — COMPREHENSIVE METABOLIC PANEL
Albumin: 4.2 (ref 3.5–5.0)
Calcium: 10.1 (ref 8.7–10.7)
Globulin: 2.6

## 2021-12-11 LAB — CBC: RBC: 4.84 (ref 3.87–5.11)

## 2021-12-11 LAB — VITAMIN D 25 HYDROXY (VIT D DEFICIENCY, FRACTURES): Vit D, 25-Hydroxy: 46.1

## 2021-12-11 LAB — BASIC METABOLIC PANEL
BUN: 12 (ref 4–21)
CO2: 25 — AB (ref 13–22)
Chloride: 101 (ref 99–108)
Glucose: 109
Potassium: 4.8 mEq/L (ref 3.5–5.1)
Sodium: 138 (ref 137–147)

## 2021-12-11 LAB — IRON,TIBC AND FERRITIN PANEL
%SAT: 7
Ferritin: 28
Iron: 27
TIBC: 369
UIBC: 342

## 2021-12-11 LAB — HEPATIC FUNCTION PANEL
ALT: 19 U/L (ref 7–35)
AST: 17 (ref 13–35)
Alkaline Phosphatase: 119 (ref 25–125)
Bilirubin, Total: 0.2

## 2021-12-11 LAB — CBC AND DIFFERENTIAL
HCT: 40 (ref 36–46)
Hemoglobin: 12.8 (ref 12.0–16.0)
Neutrophils Absolute: 7.6
Platelets: 381 10*3/uL (ref 150–400)
WBC: 10.9

## 2021-12-11 LAB — HEMOGLOBIN A1C: Hemoglobin A1C: 5.2

## 2021-12-11 LAB — VITAMIN B12: Vitamin B-12: 995

## 2021-12-29 ENCOUNTER — Ambulatory Visit: Payer: BC Managed Care – PPO | Admitting: Family Medicine

## 2021-12-29 ENCOUNTER — Encounter: Payer: Self-pay | Admitting: Family Medicine

## 2021-12-29 VITALS — BP 122/66 | HR 97 | Resp 16 | Ht 68.0 in | Wt 361.0 lb

## 2021-12-29 DIAGNOSIS — D509 Iron deficiency anemia, unspecified: Secondary | ICD-10-CM

## 2021-12-29 DIAGNOSIS — R61 Generalized hyperhidrosis: Secondary | ICD-10-CM | POA: Diagnosis not present

## 2021-12-29 DIAGNOSIS — Z6841 Body Mass Index (BMI) 40.0 and over, adult: Secondary | ICD-10-CM

## 2021-12-29 DIAGNOSIS — J329 Chronic sinusitis, unspecified: Secondary | ICD-10-CM

## 2021-12-29 DIAGNOSIS — E038 Other specified hypothyroidism: Secondary | ICD-10-CM

## 2021-12-29 DIAGNOSIS — R41840 Attention and concentration deficit: Secondary | ICD-10-CM

## 2021-12-29 DIAGNOSIS — M25531 Pain in right wrist: Secondary | ICD-10-CM | POA: Diagnosis not present

## 2021-12-29 DIAGNOSIS — H9190 Unspecified hearing loss, unspecified ear: Secondary | ICD-10-CM

## 2021-12-29 DIAGNOSIS — R161 Splenomegaly, not elsewhere classified: Secondary | ICD-10-CM

## 2021-12-29 DIAGNOSIS — G43001 Migraine without aura, not intractable, with status migrainosus: Secondary | ICD-10-CM

## 2021-12-29 DIAGNOSIS — K909 Intestinal malabsorption, unspecified: Secondary | ICD-10-CM

## 2021-12-29 DIAGNOSIS — D72829 Elevated white blood cell count, unspecified: Secondary | ICD-10-CM | POA: Diagnosis not present

## 2021-12-29 DIAGNOSIS — J310 Chronic rhinitis: Secondary | ICD-10-CM

## 2021-12-29 NOTE — Progress Notes (Signed)
? ?Name: Melinda Fox Hurtado   MRN: [id number]    DOB: 1976/06/14   Date:12/29/2021 ? ?     Progress Note ? ?Chief Complaint  ?Patient presents with  ? Cyst  ?  R wrist, x1 month. Pain w/pressure  ? Poor concentration  ? Night Sweats  ?  X2 weeks  ? ? ? ?Subjective:  ? ?Melinda Fox is a 46 y.o. female, presents to clinic for acute visit ? ?S/p sleeve gastrectomy with post op infection - admitted to [facility] in Feb. ? ?Hx of subclinical hypothyroid on meds many years ago, not currently  ?Last in chart was 2021 ?She reports a prior interaction with Paxil and levothyroxine, she does endorse large amounts of hair loss, itching and scalp irritation and dryness and eyebrow symptoms as well she is also plateaued with her weight loss with bariatrics, and is having trouble concentrating ? ?Presents for poor concentration, night sweats and wrist pain/cyst ? ?Lab Results  ?Component Value Date  ? TSH 2.99 10/18/2019  ? ?Night sweats for 2 weeks happening almost every night she wakes up about 2 times but this is not associated with any palpitations or hot flashes she is not changing her close she only has a little bit of sweat on her back and then she goes back to sleep she has not had any change to her menstrual cycles and is not having any symptoms during the day.  She denies any lymphadenopathy, unintentional weight loss, chest pain, shortness of breath. ? ?She has a bump on her right wrist volar radial aspect that only bothers her if she presses on it ? ?She wants her ears checked she seems to be having some difficulty hearing things ? ? ?Current Outpatient Medications:  ?  Biotin 10 MG CAPS, Take by mouth., Disp: , Rfl:  ?  ferrous sulfate 325 (65 FE) MG tablet, Take by mouth., Disp: , Rfl:  ?  hydrOXYzine (VISTARIL) 25 MG capsule, Take 1 capsule (25 mg total) by mouth every 8 (eight) hours as needed for anxiety., Disp: 90 capsule, Rfl: 2 ?  indomethacin (INDOCIN) 50 MG capsule, Take 1 capsule (50 mg total) by mouth 3  (three) times daily as needed., Disp: 40 capsule, Rfl: 0 ?  Multiple Vitamin (MULTI-VITAMIN) tablet, Take 1 tablet by mouth daily., Disp: , Rfl:  ?  phentermine (ADIPEX-P) 37.5 MG tablet, Take by mouth., Disp: , Rfl:  ?  venlafaxine XR (EFFEXOR XR) 75 MG 24 hr capsule, Take 1 capsule (75 mg total) by mouth daily with breakfast., Disp: 90 capsule, Rfl: 3 ? ?Patient Active Problem List  ? Diagnosis Date Noted  ? OSA on CPAP 01/05/2021  ? Simple chronic bronchitis (HCC) 03/05/2019  ? Paroxysmal tachycardia (HCC) 10/23/2018  ? Moderate concentric left ventricular hypertrophy 06/01/2017  ? Iron deficiency anemia 07/01/2015  ? Migraine without aura and with status migrainosus, not intractable 07/01/2015  ? Major depression, recurrent, full remission (HCC) 07/01/2015  ? Subclinical hypothyroidism 07/01/2015  ? Allergic rhinitis 07/01/2015  ? Lumbosacral disc disease 07/01/2015  ? Sciatica neuralgia 07/01/2015  ? Morbid obesity with body mass index (BMI) of 60.0 to 69.9 in adult [facility](HCC) 07/01/2015  ? ? ?Past Surgical History:  ?Procedure Laterality Date  ? SLEEVE GASTROPLASTY  05/25/2021  ? [facility]  ? ? ?Family History  ?Adopted: Yes  ?Problem Relation Age of Onset  ? Cancer Mother   ?     breast cancer  ? Heart failure Father   ? ? ?Social  History  ? ?Tobacco Use  ? Smoking status: Never  ? Smokeless tobacco: Never  ?Vaping Use  ? Vaping Use: Never used  ?Substance Use Topics  ? Alcohol use: No  ?  Alcohol/week: 0.0 standard drinks  ?  Comment: once a year  ? Drug use: No  ?  ? ?Allergies  ?Allergen Reactions  ? Sulfa Antibiotics Other (See Comments)  ?  Was told that she had it as a child ?Other reaction(s): Unknown  ? Spearmint Flavor   ?  Smell- causes migraines  ? Oxycodone Hives  ? ? ?Health Maintenance  ?Topic Date Due  ? HIV Screening  Never done  ? Hepatitis C Screening  Never done  ? PAP SMEAR-Modifier  09/25/2017  ? COVID-19 Vaccine (3 - Booster for Moderna series) 01/19/2020  ? INFLUENZA VACCINE   03/16/2022  ? TETANUS/TDAP  07/30/2023  ? HPV VACCINES  Aged Out  ? ? ?Chart Review Today: ?I personally reviewed active problem list, medication list, allergies, family history, social history, health maintenance, notes from last encounter, lab results, imaging with the patient/caregiver today. ? ? ?Review of Systems  ?Constitutional: Negative.   ?HENT: Negative.    ?Eyes: Negative.   ?Respiratory: Negative.    ?Cardiovascular: Negative.   ?Gastrointestinal: Negative.   ?Endocrine: Negative.   ?Genitourinary: Negative.   ?Musculoskeletal: Negative.   ?Skin: Negative.   ?Allergic/Immunologic: Negative.   ?Neurological: Negative.   ?Hematological: Negative.   ?Psychiatric/Behavioral: Negative.    ?All other systems reviewed and are negative.  ? ?Objective:  ? ?Vitals:  ? 12/29/21 1420  ?BP: 122/66  ?Pulse: 97  ?Resp: 16  ?SpO2: 98%  ?Weight: (!) 361 lb (163.7 kg)  ?Height: 5\' 8"  (1.727 m)  ? ? ?Body mass index is 54.89 kg/m?. ? ?Physical Exam ?Vitals and nursing note reviewed.  ?Constitutional:   ?   General: She is not in acute distress. ?   Appearance: Normal appearance. She is well-developed and well-groomed. She is morbidly obese. She is not ill-appearing, toxic-appearing or diaphoretic.  ?HENT:  ?   Head: Normocephalic and atraumatic.  ?   Right Ear: Tympanic membrane, ear canal and external ear normal. There is no impacted cerumen.  ?   Left Ear: Tympanic membrane, ear canal and external ear normal. There is no impacted cerumen.  ?   Nose: Congestion present.  ?   Mouth/Throat:  ?   Mouth: Mucous membranes are moist.  ?   Pharynx: Oropharynx is clear. No oropharyngeal exudate or posterior oropharyngeal erythema.  ?Eyes:  ?   General:     ?   Right eye: No discharge.     ?   Left eye: No discharge.  ?   Conjunctiva/sclera: Conjunctivae normal.  ?Cardiovascular:  ?   Rate and Rhythm: Normal rate and regular rhythm.  ?   Pulses: Normal pulses.  ?   Heart sounds: Normal heart sounds.  ?Pulmonary:  ?   Effort:  Pulmonary effort is normal. No respiratory distress.  ?   Breath sounds: Normal breath sounds. No stridor. No wheezing, rhonchi or rales.  ?Chest:  ?   Chest wall: No tenderness.  ?Musculoskeletal:  ?   Comments: Right volar radial wrist round nodule, roughly 12 mm in diameter, non-mobile, firm with possible slight fluctuance, mildly ttp, no erythema ?Normal right wrist and hand ROM  ?Neurological:  ?   Mental Status: She is alert.  ?Psychiatric:     ?   Behavior: Behavior is cooperative.  ?  ? ? ? ? ?  Assessment & Plan:  ? ?  ICD-10-CM   ?1. Night sweats  R61 CANCELED: CBC with Differential/Platelet  ? mild, x2 weeks, no palpitations, hot flashes, just some back sweating, no cough, SOB, lymphadenopathy  ?  ?2. Splenomegaly  R16.1 CANCELED: CBC with Differential/Platelet  ? reviewed CT scan and pt likely in high end of normal - probably normal for her with height  ?  ?3. Leukocytosis, unspecified type  D72.829 CANCELED: CBC with Differential/Platelet  ? improved with last labs, only mildly elevated WBC  ?  ?4. Right wrist pain  M25.531   ? cyst on radial wrist volar aspect about 1.2 cm in diameter tender with palpation otherwise not sx  ?  ?5. Poor concentration  R41.840 Thyroid Panel With TSH  ?  CANCELED: TSH  ?  CANCELED: CBC with Differential/Platelet  ?  CANCELED: COMPLETE METABOLIC PANEL WITH GFR  ?  ?6. Subclinical hypothyroidism  E03.8 Thyroid Panel With TSH  ? recheck labs, having several sx that could be consistent with hypothyroid  ?  ?7. Iron deficiency anemia, unspecified iron deficiency anemia type  D50.9 CANCELED: CBC with Differential/Platelet  ? on supplement, normal H/H and cell indices but still low iron  ?  ?8. Migraine without aura and with status migrainosus, not intractable  G43.001   ?  ?9. Intestinal malabsorption, unspecified type  K90.9 CANCELED: COMPLETE METABOLIC PANEL WITH GFR  ? per bariatrics, it has gotten better, labs recently done - pt will send through mychart  ?  ?10. Chronic  rhinosinusitis  J31.0   ? J32.9   ? right > L  ?  ?11. Decreased hearing, unspecified laterality  H91.90   ? TM normal, no cerumen obstruction, offered hearing screen or referral to audiology - declined, also offered

## 2021-12-31 ENCOUNTER — Encounter: Payer: Self-pay | Admitting: Family Medicine

## 2022-01-04 ENCOUNTER — Encounter: Payer: Self-pay | Admitting: Family Medicine

## 2022-01-05 ENCOUNTER — Encounter: Payer: Self-pay | Admitting: Family Medicine

## 2022-01-05 LAB — THYROID PANEL WITH TSH
Free Thyroxine Index: 2 (ref 1.2–4.9)
T3 Uptake Ratio: 29 % (ref 24–39)
T4, Total: 6.9 ug/dL (ref 4.5–12.0)
TSH: 3.54 u[IU]/mL (ref 0.450–4.500)

## 2022-01-07 ENCOUNTER — Encounter: Payer: Self-pay | Admitting: Family Medicine

## 2022-01-07 ENCOUNTER — Telehealth (INDEPENDENT_AMBULATORY_CARE_PROVIDER_SITE_OTHER): Payer: BC Managed Care – PPO | Admitting: Family Medicine

## 2022-01-07 VITALS — Ht 68.0 in | Wt 361.0 lb

## 2022-01-07 DIAGNOSIS — J329 Chronic sinusitis, unspecified: Secondary | ICD-10-CM

## 2022-01-07 DIAGNOSIS — J410 Simple chronic bronchitis: Secondary | ICD-10-CM | POA: Diagnosis not present

## 2022-01-07 MED ORDER — PROMETHAZINE-DM 6.25-15 MG/5ML PO SYRP: 5.0000 mL | ORAL_SOLUTION | Freq: Every evening | ORAL | 0 refills | Status: DC | PRN

## 2022-01-07 MED ORDER — PREDNISONE 20 MG PO TABS: 40.0000 mg | ORAL_TABLET | Freq: Every day | ORAL | 0 refills | Status: AC

## 2022-01-07 MED ORDER — DOXYCYCLINE HYCLATE 100 MG PO TABS: 100.0000 mg | ORAL_TABLET | Freq: Two times a day (BID) | ORAL | 0 refills | Status: AC

## 2022-01-07 NOTE — Progress Notes (Signed)
Name: Melinda Fox   MRN: [id number]    DOB: [birth year]   Date:01/07/2022       Progress Note  Subjective:    Chief Complaint  Chief Complaint  Patient presents with   URI    I connected with  Melinda Fox  on 01/07/22 at  2:40 PM EDT by a video enabled telemedicine application and verified that I am speaking with the correct person using two identifiers.  I discussed the limitations of evaluation and management by telemedicine and the availability of in person appointments. The patient expressed understanding and agreed to proceed. Staff also discussed with the patient that there may be a patient responsible charge related to this service. Patient Location: work Advertising account planner Location: [facility] office Additional Individuals present: none  URI  This is a recurrent problem. The current episode started in the past 7 days. The problem has been gradually worsening. There has been no fever. Associated symptoms include congestion, coughing, headaches, a plugged ear sensation, rhinorrhea, sinus pain, sneezing and a sore throat. Pertinent negatives include no abdominal pain, chest pain, diarrhea, dysuria, ear pain, joint pain, joint swelling, nausea, neck pain, rash, swollen glands, vomiting or wheezing. She has tried decongestant and sleep for the symptoms. The treatment provided no relief.    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   OSA on CPAP 01/05/2021   Abnormal MRI, cervical spine 03/18/2020   Left carpal tunnel syndrome 03/13/2020   Ulnar neuropathy of left upper extremity 03/13/2020   Decreased grip strength of left hand 01/29/2020   Numbness and tingling in left arm 01/29/2020    Simple chronic bronchitis (HCC) 03/05/2019   Paroxysmal tachycardia (HCC) 10/23/2018   Moderate concentric left ventricular hypertrophy 06/01/2017   Iron deficiency anemia 07/01/2015   Migraine without aura and with status migrainosus, not intractable 07/01/2015   Major depression, recurrent, full remission (HCC) 07/01/2015   Subclinical hypothyroidism 07/01/2015   Allergic rhinitis 07/01/2015   Lumbosacral disc disease 07/01/2015   Sciatica neuralgia 07/01/2015   Class 3 severe obesity with body mass index (BMI) of 50.0 to 59.9 in adult [facility]) 07/01/2015    Social History   Tobacco Use   Smoking status: Never   Smokeless tobacco: Never  Substance Use Topics   Alcohol use: No    Alcohol/week: 0.0 standard drinks    Comment: once a year     Current Outpatient Medications:    Biotin 10 MG CAPS, Take by mouth., Disp: , Rfl:    ferrous sulfate 325 (65 FE) MG tablet, Take by mouth., Disp: , Rfl:    hydrOXYzine (VISTARIL) 25 MG capsule, Take 1 capsule (25 mg total) by mouth every 8 (eight) hours as needed for anxiety., Disp: 90 capsule, Rfl: 2   indomethacin (INDOCIN) 50 MG capsule, Take 1 capsule (50 mg total) by mouth 3 (three) times daily as needed., Disp: 40 capsule, Rfl: 0   Multiple Vitamin (MULTI-VITAMIN) tablet, Take 1 tablet by mouth daily., Disp: , Rfl:    phentermine (ADIPEX-P) 37.5 MG tablet, Take by mouth., Disp: , Rfl:    venlafaxine XR (EFFEXOR XR) 75 MG 24 hr capsule, Take 1 capsule (75 mg total) by mouth daily  with breakfast., Disp: 90 capsule, Rfl: 3  Allergies  Allergen Reactions   Sulfa Antibiotics Other (See Comments)    Was told that she had it as a child Other reaction(s): Unknown   Spearmint Flavor     Smell- causes migraines   Oxycodone Hives      Review of Systems  Constitutional: Negative.   HENT:  Positive for congestion, rhinorrhea, sinus pain, sneezing and sore throat. Negative for ear pain.   Eyes: Negative.   Respiratory:  Positive for  cough. Negative for wheezing.   Cardiovascular: Negative.  Negative for chest pain.  Gastrointestinal: Negative.  Negative for abdominal pain, diarrhea, nausea and vomiting.  Endocrine: Negative.   Genitourinary: Negative.  Negative for dysuria.  Musculoskeletal: Negative.  Negative for joint pain and neck pain.  Skin: Negative.  Negative for rash.  Allergic/Immunologic: Negative.   Neurological:  Positive for headaches.  Hematological: Negative.   Psychiatric/Behavioral: Negative.    All other systems reviewed and are negative.   Objective:   Virtual encounter, vitals limited, only able to obtain the following Today's Vitals   01/07/22 1421  Weight: (!) 361 lb (163.7 kg)  Height: 5\' 8"  (1.727 m)  PainSc: 0-No pain   Body mass index is 54.89 kg/m. Nursing Note and Vital Signs reviewed.  Physical Exam Vitals reviewed.  Constitutional:      General: She is not in acute distress.    Appearance: Normal appearance. She is obese. She is not ill-appearing, toxic-appearing or diaphoretic.  Pulmonary:     Effort: No respiratory distress.  Neurological:     Mental Status: She is alert. Mental status is at baseline.    PE limited by virtual encounter  No results found for this or any previous visit (from the past 72 hour(s)).  Assessment and Plan:   1. Simple chronic bronchitis (HCC) Can continue OTC cough meds, mucinex She notes tessalon perles in the past did not help I have recommended steroids and inhaler if coughing/respiratory sx worsened - predniSONE (DELTASONE) 20 MG tablet; Take 2 tablets (40 mg total) by mouth daily with breakfast for 5 days.  Dispense: 10 tablet; Refill: 0 - promethazine-dextromethorphan (PROMETHAZINE-DM) 6.25-15 MG/5ML syrup; Take 5 mLs by mouth at bedtime as needed for cough.  Dispense: 100 mL; Refill: 0  2. Recurrent sinusitis She has hx of recurrent sinusitis that only responds to abx - per pt Antihistamines "knock her out"  Encouraged her to  get on and try antihistamine and nasal steroid Antibiotics may not be helpful w/o examining her its hard to say if she has acute bacterial sinusitis - but with her hx and with waiting for 7 days with worsening sx will send them in  If she has another sinus infection I have told her she needs to f/up with ENT again - doxycycline (VIBRA-TABS) 100 MG tablet; Take 1 tablet (100 mg total) by mouth 2 (two) times daily for 7 days.  Dispense: 14 tablet; Refill: 0   -Red flags and when to present for emergency care or RTC including fever >101.25F, chest pain, shortness of breath, new/worsening/un-resolving symptoms, reviewed with patient at time of visit. Follow up and care instructions discussed and provided in AVS. - I discussed the assessment and treatment plan with the patient. The patient was provided an opportunity to ask questions and all were answered. The patient agreed with the plan and demonstrated an understanding of the instructions.  I provided 20 minutes of non-face-to-face time during this encounter.  10-18-1989, [name]  01/07/22 2:47 PM

## 2022-02-02 NOTE — Patient Instructions (Addendum)
[phone] [facility]?  Preventive Care 1-45 Years Old, Female Preventive care refers to lifestyle choices and visits with your health care provider that can promote health and wellness. Preventive care visits are also called wellness exams. What can I expect for my preventive care visit? Counseling Your health care provider may ask you questions about your: Medical history, including: Past medical problems. Family medical history. Pregnancy history. Current health, including: Menstrual cycle. Method of birth control. Emotional well-being. Home life and relationship well-being. Sexual activity and sexual health. Lifestyle, including: Alcohol, nicotine or tobacco, and drug use. Access to firearms. Diet, exercise, and sleep habits. Work and work Statistician. Sunscreen use. Safety issues such as seatbelt and bike helmet use. Physical exam Your health care provider will check your: Height and weight. These may be used to calculate your BMI (body mass index). BMI is a measurement that tells if you are at a healthy weight. Waist circumference. This measures the distance around your waistline. This measurement also tells if you are at a healthy weight and may help predict your risk of certain diseases, such as type 2 diabetes and high blood pressure. Heart rate and blood pressure. Body temperature. Skin for abnormal spots. What immunizations do I need?  Vaccines are usually given at various ages, according to a schedule. Your health care provider will recommend vaccines for you based on your age, medical history, and lifestyle or other factors, such as travel or where you work. What tests do I need? Screening Your health care provider may recommend screening tests for certain conditions. This may include: Lipid and cholesterol levels. Diabetes screening. This is done by checking your blood sugar (glucose) after you have not eaten for a while  (fasting). Pelvic exam and Pap test. Hepatitis B test. Hepatitis C test. HIV (human immunodeficiency virus) test. STI (sexually transmitted infection) testing, if you are at risk. Lung cancer screening. Colorectal cancer screening. Mammogram. Talk with your health care provider about when you should start having regular mammograms. This may depend on whether you have a family history of breast cancer. BRCA-related cancer screening. This may be done if you have a family history of breast, ovarian, tubal, or peritoneal cancers. Bone density scan. This is done to screen for osteoporosis. Talk with your health care provider about your test results, treatment options, and if necessary, the need for more tests. Follow these instructions at home: Eating and drinking  Eat a diet that includes fresh fruits and vegetables, whole grains, lean protein, and low-fat dairy products. Take vitamin and mineral supplements as recommended by your health care provider. Do not drink alcohol if: Your health care provider tells you not to drink. You are pregnant, may be pregnant, or are planning to become pregnant. If you drink alcohol: Limit how much you have to 0-1 drink a day. Know how much alcohol is in your drink. In the [place] one drink equals one 12 oz bottle of beer (355 mL), one 5 oz glass of wine (148 mL), or one 1 oz glass of hard liquor (44 mL). Lifestyle Brush your teeth every morning and night with fluoride toothpaste. Floss one time each day. Exercise for at least 30 minutes 5 or more days each week. Do not use any products that contain nicotine or tobacco. These products include cigarettes, chewing tobacco, and vaping devices, such as e-cigarettes. If you need help quitting, ask your health care provider. Do not use drugs. If you are sexually active, practice safe sex. Use a condom  or other form of protection to prevent STIs. If you do not wish to become pregnant, use a form of birth control. If  you plan to become pregnant, see your health care provider for a prepregnancy visit. Take aspirin only as told by your health care provider. Make sure that you understand how much to take and what form to take. Work with your health care provider to find out whether it is safe and beneficial for you to take aspirin daily. Find healthy ways to manage stress, such as: Meditation, yoga, or listening to music. Journaling. Talking to a trusted person. Spending time with friends and family. Minimize exposure to UV radiation to reduce your risk of skin cancer. Safety Always wear your seat belt while driving or riding in a vehicle. Do not drive: If you have been drinking alcohol. Do not ride with someone who has been drinking. When you are tired or distracted. While texting. If you have been using any mind-altering substances or drugs. Wear a helmet and other protective equipment during sports activities. If you have firearms in your house, make sure you follow all gun safety procedures. Seek help if you have been physically or sexually abused. What's next? Visit your health care provider once a year for an annual wellness visit. Ask your health care provider how often you should have your eyes and teeth checked. Stay up to date on all vaccines. This information is not intended to replace advice given to you by your health care provider. Make sure you discuss any questions you have with your health care provider. Document Revised: 01/28/2021 Document Reviewed: 01/28/2021 Elsevier Patient Education  [employer] [place].

## 2022-02-04 ENCOUNTER — Encounter: Payer: Self-pay | Admitting: Family Medicine

## 2022-02-04 ENCOUNTER — Ambulatory Visit (INDEPENDENT_AMBULATORY_CARE_PROVIDER_SITE_OTHER): Payer: BC Managed Care – PPO | Admitting: Family Medicine

## 2022-02-04 VITALS — BP 106/60 | HR 74 | Temp 98.1°F | Resp 16 | Ht 68.0 in | Wt 353.5 lb

## 2022-02-04 DIAGNOSIS — Z1159 Encounter for screening for other viral diseases: Secondary | ICD-10-CM | POA: Diagnosis not present

## 2022-02-04 DIAGNOSIS — Z1231 Encounter for screening mammogram for malignant neoplasm of breast: Secondary | ICD-10-CM

## 2022-02-04 DIAGNOSIS — Z114 Encounter for screening for human immunodeficiency virus [HIV]: Secondary | ICD-10-CM

## 2022-02-04 DIAGNOSIS — F331 Major depressive disorder, recurrent, moderate: Secondary | ICD-10-CM

## 2022-02-04 DIAGNOSIS — Z124 Encounter for screening for malignant neoplasm of cervix: Secondary | ICD-10-CM

## 2022-02-04 DIAGNOSIS — Z0000 Encounter for general adult medical examination without abnormal findings: Secondary | ICD-10-CM | POA: Diagnosis not present

## 2022-02-04 DIAGNOSIS — E039 Hypothyroidism, unspecified: Secondary | ICD-10-CM

## 2022-02-04 DIAGNOSIS — Z6841 Body Mass Index (BMI) 40.0 and over, adult: Secondary | ICD-10-CM

## 2022-02-04 DIAGNOSIS — G4700 Insomnia, unspecified: Secondary | ICD-10-CM

## 2022-02-04 DIAGNOSIS — F419 Anxiety disorder, unspecified: Secondary | ICD-10-CM

## 2022-02-04 MED ORDER — TRAZODONE HCL 50 MG PO TABS: 25.0000 mg | ORAL_TABLET | Freq: Every evening | ORAL | 3 refills | Status: DC | PRN

## 2022-02-04 MED ORDER — LEVOTHYROXINE SODIUM 25 MCG PO TABS: 25.0000 ug | ORAL_TABLET | Freq: Every day | ORAL | 3 refills | Status: DC

## 2022-02-04 MED ORDER — HYDROXYZINE PAMOATE 25 MG PO CAPS: 25.0000 mg | ORAL_CAPSULE | Freq: Three times a day (TID) | ORAL | 0 refills | Status: DC | PRN

## 2022-02-24 ENCOUNTER — Encounter: Payer: Self-pay | Admitting: Family Medicine

## 2022-03-11 ENCOUNTER — Other Ambulatory Visit: Payer: Self-pay | Admitting: Family Medicine

## 2022-03-11 DIAGNOSIS — F331 Major depressive disorder, recurrent, moderate: Secondary | ICD-10-CM

## 2022-03-29 ENCOUNTER — Ambulatory Visit (HOSPITAL_COMMUNITY)
Admission: RE | Admit: 2022-03-29 | Discharge: 2022-03-29 | Disposition: A | Discharge status: Home / Self Care | Payer: BC Managed Care – PPO | Source: Ambulatory Visit | Attending: Family Medicine | Admitting: Family Medicine

## 2022-03-29 DIAGNOSIS — Z1231 Encounter for screening mammogram for malignant neoplasm of breast: Secondary | ICD-10-CM | POA: Insufficient documentation

## 2022-03-31 ENCOUNTER — Other Ambulatory Visit (HOSPITAL_COMMUNITY): Payer: Self-pay | Admitting: Family Medicine

## 2022-03-31 DIAGNOSIS — R928 Other abnormal and inconclusive findings on diagnostic imaging of breast: Secondary | ICD-10-CM

## 2022-04-07 DIAGNOSIS — R001 Bradycardia, unspecified: Secondary | ICD-10-CM | POA: Insufficient documentation

## 2022-05-04 ENCOUNTER — Encounter (HOSPITAL_COMMUNITY): Payer: Self-pay

## 2022-05-04 ENCOUNTER — Ambulatory Visit (HOSPITAL_COMMUNITY)
Admission: RE | Admit: 2022-05-04 | Discharge: 2022-05-04 | Disposition: A | Discharge status: Home / Self Care | Payer: BC Managed Care – PPO | Source: Ambulatory Visit | Attending: Family Medicine | Admitting: Family Medicine

## 2022-05-04 DIAGNOSIS — R928 Other abnormal and inconclusive findings on diagnostic imaging of breast: Secondary | ICD-10-CM | POA: Insufficient documentation

## 2022-06-04 DIAGNOSIS — K219 Gastro-esophageal reflux disease without esophagitis: Secondary | ICD-10-CM | POA: Insufficient documentation

## 2022-06-04 DIAGNOSIS — K913 Postprocedural intestinal obstruction, unspecified as to partial versus complete: Secondary | ICD-10-CM | POA: Insufficient documentation

## 2022-06-09 ENCOUNTER — Telehealth (INDEPENDENT_AMBULATORY_CARE_PROVIDER_SITE_OTHER): Payer: BC Managed Care – PPO | Admitting: Family Medicine

## 2022-06-09 DIAGNOSIS — F321 Major depressive disorder, single episode, moderate: Secondary | ICD-10-CM

## 2022-06-09 DIAGNOSIS — F331 Major depressive disorder, recurrent, moderate: Secondary | ICD-10-CM

## 2022-06-09 MED ORDER — VENLAFAXINE HCL ER 37.5 MG PO CP24: ORAL_CAPSULE | ORAL | 1 refills | Status: DC

## 2022-06-09 MED ORDER — VENLAFAXINE HCL ER 75 MG PO CP24: ORAL_CAPSULE | ORAL | 1 refills | Status: DC

## 2022-06-09 NOTE — Progress Notes (Signed)
Name: Melinda Fox   MRN: [id number]    DOB: 08/13/1976   Date:06/09/2022       Progress Note  Subjective:    Chief Complaint  Chief Complaint  Patient presents with   Follow-up    Pt would like to discuss about her health    I connected with  [name]  on 06/09/22 at  2:20 PM EDT by a video enabled telemedicine application and verified that I am speaking with the correct person using two identifiers.  I discussed the limitations of evaluation and management by telemedicine and the availability of in person appointments. The patient expressed understanding and agreed to proceed. Staff also discussed with the patient that there may be a patient responsible charge related to this service. Patient Location: home Provider Location: [facility] clinic Additional Individuals present: none   concern is dose change for effexor, she is on 75 mg, would like to increase  She is working with psych/therapist every week, moving to every week Her recent surgeries have had severe post op complications with N/V she was just admitted and in patient for 10 d     Patient Active Problem List   Diagnosis Date Noted   Intra-abdominal fluid collection 10/03/2021   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   OSA on CPAP 01/05/2021   Abnormal MRI, cervical spine 03/18/2020   Left carpal tunnel syndrome 03/13/2020   Ulnar neuropathy of left upper extremity 03/13/2020   Decreased grip strength of left hand 01/29/2020   Numbness and tingling in left arm 01/29/2020   Simple chronic bronchitis (HCC) 03/05/2019   Paroxysmal tachycardia (HCC) 10/23/2018   Moderate concentric left ventricular hypertrophy 06/01/2017   Iron deficiency anemia 07/01/2015   Migraine without aura and with status migrainosus, not intractable 07/01/2015   Major depression, recurrent, full remission (HCC) 07/01/2015   Subclinical hypothyroidism 07/01/2015   Allergic rhinitis 07/01/2015   Lumbosacral disc disease  07/01/2015   Sciatica neuralgia 07/01/2015   Class 3 severe obesity with body mass index (BMI) of 50.0 to 59.9 in adult [facility]) 07/01/2015    Social History   Tobacco Use   Smoking status: Never   Smokeless tobacco: Never  Substance Use Topics   Alcohol use: No    Alcohol/week: 0.0 standard drinks of alcohol    Comment: once a year     Current Outpatient Medications:    Biotin 10 MG CAPS, Take by mouth., Disp: , Rfl:    butalbital-acetaminophen-caffeine (FIORICET) 50-325-40 MG tablet, Take 1 tablet by mouth every 8 (eight) hours as needed., Disp: , Rfl:    famotidine (PEPCID) 20 MG tablet, Take 20 mg by mouth 2 (two) times daily., Disp: , Rfl:    ferrous sulfate 325 (65 FE) MG tablet, Take by mouth., Disp: , Rfl:    hydrOXYzine (VISTARIL) 25 MG capsule, Take 1 capsule (25 mg total) by mouth every 8 (eight) hours as needed for anxiety., Disp: 90 capsule, Rfl: 2   hydrOXYzine (VISTARIL) 25 MG capsule, Take 1 capsule (25 mg total) by mouth every 8 (eight) hours as needed., Disp: 30 capsule, Rfl: 0   hyoscyamine (LEVSIN SL) 0.125 MG SL tablet, SMARTSIG:2 Sublingual Twice Daily, Disp: , Rfl:    indomethacin (INDOCIN) 50 MG capsule, Take 1 capsule (50 mg total) by mouth 3 (three) times daily as needed., Disp: 40 capsule, Rfl: 0   levothyroxine (SYNTHROID) 25 MCG tablet, Take 1 tablet (25 mcg total) by mouth daily., Disp: 90 tablet, Rfl: 3   metoCLOPramide (REGLAN)  5 MG tablet, Take 5 mg by mouth 3 (three) times daily., Disp: , Rfl:    misoprostol (CYTOTEC) 200 MCG tablet, Take 200 mcg by mouth 4 (four) times daily., Disp: , Rfl:    Multiple Vitamin (MULTI-VITAMIN) tablet, Take 1 tablet by mouth daily., Disp: , Rfl:    Multiple Vitamins-Minerals (ONE DAILY CALCIUM/IRON) TABS, Take by mouth., Disp: , Rfl:    ondansetron (ZOFRAN) 4 MG tablet, Take 4 mg by mouth every 8 (eight) hours as needed., Disp: , Rfl:    pantoprazole (PROTONIX) 40 MG tablet, Take 40 mg by mouth 2 (two) times daily., Disp:  , Rfl:    phentermine (ADIPEX-P) 37.5 MG tablet, Take by mouth., Disp: , Rfl:    promethazine (PHENERGAN) 12.5 MG tablet, Take by mouth., Disp: , Rfl:    promethazine-dextromethorphan (PROMETHAZINE-DM) 6.25-15 MG/5ML syrup, Take 5 mLs by mouth at bedtime as needed for cough., Disp: 100 mL, Rfl: 0   sucralfate (CARAFATE) 1 g tablet, Take 1 g by mouth 4 (four) times daily., Disp: , Rfl:    traZODone (DESYREL) 50 MG tablet, Take 0.5-1 tablets (25-50 mg total) by mouth at bedtime as needed for sleep., Disp: 30 tablet, Rfl: 3   venlafaxine XR (EFFEXOR-XR) 75 MG 24 hr capsule, TAKE 1 CAPSULE(75 MG) BY MOUTH DAILY WITH BREAKFAST, Disp: 90 capsule, Rfl: 3  Allergies  Allergen Reactions   Sulfa Antibiotics Other (See Comments)    Was told that she had it as a child Other reaction(s): Unknown   Spearmint Flavor     Smell- causes migraines   Oxycodone Hives   I personally reviewed active problem list, medication list, allergies, family history, social history, health maintenance, notes from last encounter, lab results, imaging with the patient/caregiver today.    Review of Systems  Constitutional: Negative.   HENT: Negative.    Eyes: Negative.   Respiratory: Negative.    Cardiovascular: Negative.   Gastrointestinal: Negative.   Endocrine: Negative.   Genitourinary: Negative.   Musculoskeletal: Negative.   Skin: Negative.   Allergic/Immunologic: Negative.   Neurological: Negative.   Hematological: Negative.   Psychiatric/Behavioral: Negative.    All other systems reviewed and are negative.     Objective:   Virtual encounter, vitals limited, only able to obtain the following There were no vitals filed for this visit. There is no height or weight on file to calculate BMI. Nursing Note and Vital Signs reviewed.  Physical Exam Vitals and nursing note reviewed.  Constitutional:      General: She is not in acute distress.    Appearance: Normal appearance. She is not ill-appearing,  toxic-appearing or diaphoretic.  Neurological:     Mental Status: She is alert.  Psychiatric:        Mood and Affect: Mood normal.        Behavior: Behavior normal.     PE limited by virtual encounter  No results found for this or any previous visit (from the past 72 hour(s)).  Assessment and Plan:   1. Current moderate episode of major depressive disorder, unspecified whether recurrent (HCC) Mood worse with recent postop complications/health changes She is working with therapist 2x a week they both think she should increase effexor dose for worse depressive sx Her thyroid levels were checked recently at normal  1 month f/up if dose needs to be adjusted again Explained we could increase to 150 or add something to it.  - venlafaxine XR (EFFEXOR-XR) 75 MG 24 hr capsule; Take 75 mg capsule  po qam with the 37.5 mg capsule for total 112.5 mg daily  Dispense: 90 capsule; Refill: 1 - venlafaxine XR (EFFEXOR XR) 37.5 MG 24 hr capsule; Take 37.5 mg capsule po qam with the 75 mg capsule for total 112.5 mg daily  Dispense: 90 capsule; Refill: 1    - I discussed the assessment and treatment plan with the patient. The patient was provided an opportunity to ask questions and all were answered. The patient agreed with the plan and demonstrated an understanding of the instructions.  I provided 20+ minutes of non-face-to-face time during this encounter.  Delsa Grana, [name] 06/09/22 3:06 PM

## 2022-07-28 ENCOUNTER — Ambulatory Visit (INDEPENDENT_AMBULATORY_CARE_PROVIDER_SITE_OTHER): Payer: BC Managed Care – PPO | Admitting: Family Medicine

## 2022-07-28 ENCOUNTER — Encounter: Payer: Self-pay | Admitting: Family Medicine

## 2022-07-28 VITALS — BP 122/70 | HR 87 | Temp 98.6°F | Resp 16 | Ht 68.0 in | Wt 295.5 lb

## 2022-07-28 DIAGNOSIS — E038 Other specified hypothyroidism: Secondary | ICD-10-CM

## 2022-07-28 DIAGNOSIS — R5383 Other fatigue: Secondary | ICD-10-CM | POA: Diagnosis not present

## 2022-07-28 DIAGNOSIS — E039 Hypothyroidism, unspecified: Secondary | ICD-10-CM

## 2022-07-28 DIAGNOSIS — J329 Chronic sinusitis, unspecified: Secondary | ICD-10-CM | POA: Diagnosis not present

## 2022-07-28 DIAGNOSIS — D509 Iron deficiency anemia, unspecified: Secondary | ICD-10-CM | POA: Diagnosis not present

## 2022-07-28 MED ORDER — IPRATROPIUM BROMIDE 0.03 % NA SOLN: 2.0000 | Freq: Two times a day (BID) | NASAL | 12 refills | Status: DC

## 2022-07-28 MED ORDER — RYALTRIS 665-25 MCG/ACT NA SUSP: 2.0000 | Freq: Two times a day (BID) | NASAL | 2 refills | Status: DC

## 2022-07-28 MED ORDER — LEVOTHYROXINE SODIUM 50 MCG PO TABS: 50.0000 ug | ORAL_TABLET | Freq: Every day | ORAL | 0 refills | Status: DC

## 2022-07-28 MED ORDER — AMOXICILLIN-POT CLAVULANATE 875-125 MG PO TABS: 1.0000 | ORAL_TABLET | Freq: Two times a day (BID) | ORAL | 0 refills | Status: AC

## 2022-07-28 MED ORDER — LEVOCETIRIZINE DIHYDROCHLORIDE 5 MG PO TABS: 5.0000 mg | ORAL_TABLET | Freq: Every evening | ORAL | 1 refills | Status: DC

## 2022-07-28 NOTE — Progress Notes (Signed)
Patient ID: Melinda Fox, female    DOB: 08/19/1975, 46 y.o.   MRN: [id number]  PCP: Danelle Berry, [name]  Chief Complaint  Patient presents with   URI    Onset 7 days symptoms include: headache, chills, sore throat and fatigue    Subjective:   Melinda Fox is a 46 y.o. female, presents to clinic with CC of the following:  HPI  Sinus pain pressure congestion onset over a week ago, worsening despite waiting and tx with otc meds, she's tried Sudafed, advil cold and sinus  Sx for over a week really fatigued, stuffy congested nose, a lot of running and dry cough Facial pressure/sinus headaches She has hx of recurrent sinus infections, 2-3 earlier this year Her ears also feel blocked  She also reports worsesning labs/anemia/deficiencies - going to f/up with hematology in Jan  Severe fatigue, she would like to adjust her thryoid meds - recently started tx for subclinical hypothyroid - was previously on meds, last labs through care everywhere TSH ~ 3  Moods - she feels like the effexor dose increase has helped a little bit - will stay on the same dose    Patient Active Problem List   Diagnosis Date Noted   Intra-abdominal fluid collection 10/03/2021   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   OSA on CPAP 01/05/2021   Abnormal MRI, cervical spine 03/18/2020   Left carpal tunnel syndrome 03/13/2020   Ulnar neuropathy of left upper extremity 03/13/2020   Decreased grip strength of left hand 01/29/2020   Numbness and tingling in left arm 01/29/2020   Simple chronic bronchitis (HCC) 03/05/2019   Paroxysmal tachycardia (HCC) 10/23/2018   Moderate concentric left ventricular hypertrophy 06/01/2017   Iron deficiency anemia 07/01/2015   Migraine without aura and with status migrainosus, not intractable 07/01/2015   Major depression, recurrent, full remission (HCC) 07/01/2015   Subclinical hypothyroidism 07/01/2015   Allergic rhinitis 07/01/2015   Lumbosacral disc  disease 07/01/2015   Sciatica neuralgia 07/01/2015   Class 3 severe obesity with body mass index (BMI) of 50.0 to 59.9 in adult (HCC) 07/01/2015      Current Outpatient Medications:    Biotin 10 MG CAPS, Take by mouth., Disp: , Rfl:    butalbital-acetaminophen-caffeine (FIORICET) 50-325-40 MG tablet, Take 1 tablet by mouth every 8 (eight) hours as needed., Disp: , Rfl:    famotidine (PEPCID) 20 MG tablet, Take 20 mg by mouth 2 (two) times daily., Disp: , Rfl:    ferrous sulfate 325 (65 FE) MG tablet, Take by mouth., Disp: , Rfl:    hydrOXYzine (VISTARIL) 25 MG capsule, Take 1 capsule (25 mg total) by mouth every 8 (eight) hours as needed for anxiety., Disp: 90 capsule, Rfl: 2   hydrOXYzine (VISTARIL) 25 MG capsule, Take 1 capsule (25 mg total) by mouth every 8 (eight) hours as needed., Disp: 30 capsule, Rfl: 0   hyoscyamine (LEVSIN SL) 0.125 MG SL tablet, SMARTSIG:2 Sublingual Twice Daily, Disp: , Rfl:    indomethacin (INDOCIN) 50 MG capsule, Take 1 capsule (50 mg total) by mouth 3 (three) times daily as needed., Disp: 40 capsule, Rfl: 0   levothyroxine (SYNTHROID) 25 MCG tablet, Take 1 tablet (25 mcg total) by mouth daily., Disp: 90 tablet, Rfl: 3   metoCLOPramide (REGLAN) 5 MG tablet, Take 5 mg by mouth 3 (three) times daily., Disp: , Rfl:    misoprostol (CYTOTEC) 200 MCG tablet, Take 200 mcg by mouth 4 (four) times daily., Disp: , Rfl:  Multiple Vitamin (MULTI-VITAMIN) tablet, Take 1 tablet by mouth daily., Disp: , Rfl:    Multiple Vitamins-Minerals (ONE DAILY CALCIUM/IRON) TABS, Take by mouth., Disp: , Rfl:    ondansetron (ZOFRAN) 4 MG tablet, Take 4 mg by mouth every 8 (eight) hours as needed., Disp: , Rfl:    pantoprazole (PROTONIX) 40 MG tablet, Take 40 mg by mouth 2 (two) times daily., Disp: , Rfl:    phentermine (ADIPEX-P) 37.5 MG tablet, Take by mouth., Disp: , Rfl:    promethazine (PHENERGAN) 12.5 MG tablet, Take by mouth., Disp: , Rfl:    promethazine-dextromethorphan  (PROMETHAZINE-DM) 6.25-15 MG/5ML syrup, Take 5 mLs by mouth at bedtime as needed for cough., Disp: 100 mL, Rfl: 0   sucralfate (CARAFATE) 1 g tablet, Take 1 g by mouth 4 (four) times daily., Disp: , Rfl:    traZODone (DESYREL) 50 MG tablet, Take 0.5-1 tablets (25-50 mg total) by mouth at bedtime as needed for sleep., Disp: 30 tablet, Rfl: 3   venlafaxine XR (EFFEXOR XR) 37.5 MG 24 hr capsule, Take 37.5 mg capsule po qam with the 75 mg capsule for total 112.5 mg daily, Disp: 90 capsule, Rfl: 1   venlafaxine XR (EFFEXOR-XR) 75 MG 24 hr capsule, Take 75 mg capsule po qam with the 37.5 mg capsule for total 112.5 mg daily, Disp: 90 capsule, Rfl: 1   Allergies  Allergen Reactions   Sulfa Antibiotics Other (See Comments)    Was told that she had it as a child Other reaction(s): Unknown   Spearmint Flavor     Smell- causes migraines   Oxycodone Hives     Social History   Tobacco Use   Smoking status: Never   Smokeless tobacco: Never  Vaping Use   Vaping Use: Never used  Substance Use Topics   Alcohol use: No    Alcohol/week: 0.0 standard drinks of alcohol    Comment: once a year   Drug use: No      Chart Review Today: I personally reviewed active problem list, medication list, allergies, family history, social history, health maintenance, notes from last encounter, lab results, imaging with the patient/caregiver today.    Review of Systems  Constitutional: Negative.   HENT: Negative.    Eyes: Negative.   Respiratory: Negative.    Cardiovascular: Negative.   Gastrointestinal: Negative.   Endocrine: Negative.   Genitourinary: Negative.   Musculoskeletal: Negative.   Skin: Negative.   Allergic/Immunologic: Negative.   Neurological: Negative.   Hematological: Negative.   Psychiatric/Behavioral: Negative.    All other systems reviewed and are negative.      Objective:   Vitals:   07/28/22 1450  BP: 122/70  Pulse: 87  Resp: 16  Temp: 98.6 F (37 C)  SpO2: 99%   Weight: 295 lb 8 oz (134 kg)  Height: 5\' 8"  (1.727 m)    Body mass index is 44.93 kg/m.  Physical Exam Vitals and nursing note reviewed.  Constitutional:      General: She is not in acute distress.    Appearance: Normal appearance. She is well-developed and well-groomed. She is ill-appearing (mildly ill appearing - looks tired). She is not toxic-appearing or diaphoretic.  HENT:     Head: Normocephalic and atraumatic.     Right Ear: Tympanic membrane, ear canal and external ear normal. There is no impacted cerumen.     Left Ear: Tympanic membrane, ear canal and external ear normal. There is no impacted cerumen.     Nose: Mucosal edema, congestion  and rhinorrhea present. Rhinorrhea is clear and purulent.     Right Turbinates: Enlarged and swollen. Not pale.     Left Turbinates: Enlarged and swollen. Not pale.     Right Sinus: Maxillary sinus tenderness and frontal sinus tenderness present.     Left Sinus: Maxillary sinus tenderness and frontal sinus tenderness present.     Mouth/Throat:     Mouth: Mucous membranes are moist.     Pharynx: Oropharynx is clear. Uvula midline. No pharyngeal swelling, oropharyngeal exudate or posterior oropharyngeal erythema.     Tonsils: No tonsillar exudate or tonsillar abscesses. 0 on the right. 0 on the left.  Eyes:     General: No scleral icterus.       Right eye: No discharge.        Left eye: No discharge.     Conjunctiva/sclera: Conjunctivae normal.  Cardiovascular:     Rate and Rhythm: Normal rate and regular rhythm.  Pulmonary:     Effort: Pulmonary effort is normal.     Breath sounds: Normal breath sounds.  Abdominal:     General: Bowel sounds are normal.     Palpations: Abdomen is soft.  Skin:    Coloration: Skin is not jaundiced or pale.  Neurological:     Mental Status: Mental status is at baseline.  Psychiatric:        Behavior: Behavior is cooperative.      Results for orders placed or performed in visit on 12/31/21  Iron,  TIBC and Ferritin Panel  Result Value Ref Range   Ferritin 28    Iron 27    TIBC 369    UIBC 342    %SAT 7   CBC and differential  Result Value Ref Range   Hemoglobin 12.8 12.0 - 16.0   HCT 40 36 - 46   Neutrophils Absolute 7.60    Platelets 381 150 - 400 K/uL   WBC 10.9   CBC  Result Value Ref Range   RBC 4.84 3.87 - 5.11  VITAMIN D 25 Hydroxy (Vit-D Deficiency, Fractures)  Result Value Ref Range   Vit D, 25-Hydroxy 46.1   Basic metabolic panel  Result Value Ref Range   Glucose 109    BUN 12 4 - 21   CO2 25 (A) 13 - 22   Potassium 4.8 3.5 - 5.1 mEq/L   Sodium 138 137 - 147   Chloride 101 99 - 108  Comprehensive metabolic panel  Result Value Ref Range   Globulin 2.6    Calcium 10.1 8.7 - 10.7   Albumin 4.2 3.5 - 5.0  Hepatic function panel  Result Value Ref Range   Alkaline Phosphatase 119 25 - 125   ALT 19 7 - 35 U/L   AST 17 13 - 35   Bilirubin, Total 0.2   Vitamin B12  Result Value Ref Range   Vitamin B-12 995   Hemoglobin A1c  Result Value Ref Range   Hemoglobin A1C 5.2        Assessment & Plan:   Problem List Items Addressed This Visit       Endocrine   Subclinical hypothyroidism    She endorses severe fatigue and would like to increase the levothyroxine dose, recently started 25 mcg, labs reviewed thorugh care everywhere, can increase to 50 mcg and we will recheck sx and labs again in 6-8 weeks      Relevant Medications   levothyroxine (SYNTHROID) 50 MCG tablet     Other  Iron deficiency anemia    Pt has failed oral supplementation with iron working with weight loss surgical specialists, they have followed labs which recently worsened, she has f/up next month with hem/onc likely to start infusions      Other Visit Diagnoses     Recurrent sinusitis    -  Primary   7+ days of worsening sx, edema, erythema a copious discharge with sinus TTP Ha and constitutional sx, add antihistamine, nose spray, if any worsening start abx   Relevant  Medications   amoxicillin-clavulanate (AUGMENTIN) 875-125 MG tablet   levocetirizine (XYZAL) 5 MG tablet   ipratropium (ATROVENT) 0.03 % nasal spray   Olopatadine-Mometasone (RYALTRIS) 665-25 MCG/ACT SUSP   Fatigue, unspecified type       likely multifactoria - multiple deficiencies, worse MDD, hypothyroid, recurrent sinus infections, prolonged stress   Hypothyroidism, unspecified type       see below for plan   Relevant Medications   levothyroxine (SYNTHROID) 50 MCG tablet   Other Relevant Orders   TSH        F/up visit and labs in about 6-8 weeks   Pt should f/up if sinus sx do not improve -she was urged to start and maintain daily antihistamines and intranasal steroid sprays and add a saline spray, she can try the Atrovent nasal spray to help dry out the mucosa which has profuse nasal discharge or she could try the combo nasal spray that was sent in instead of the Flonase or Nasonex.  I encouraged her to at least try 1 nasal spray for a week or 2 and to stick with it if it is effective to prevent recurrent sinus infections requiring antibiotics since she has needed several this year.  If she continues to have recurrent sinus infections I did did encourage her to do a consult with ENT -she does not want to this right now.    Danelle Berry, [name] 07/28/22 3:16 PM

## 2022-07-28 NOTE — Assessment & Plan Note (Signed)
She endorses severe fatigue and would like to increase the levothyroxine dose, recently started 25 mcg, labs reviewed thorugh care everywhere, can increase to 50 mcg and we will recheck sx and labs again in 6-8 weeks

## 2022-07-28 NOTE — Assessment & Plan Note (Signed)
Pt has failed oral supplementation with iron working with weight loss surgical specialists, they have followed labs which recently worsened, she has f/up next month with hem/onc likely to start infusions

## 2022-08-24 ENCOUNTER — Other Ambulatory Visit: Payer: Self-pay | Admitting: Family Medicine

## 2022-08-24 DIAGNOSIS — R519 Headache, unspecified: Secondary | ICD-10-CM

## 2022-08-25 MED ORDER — INDOMETHACIN 50 MG PO CAPS: 50.0000 mg | ORAL_CAPSULE | Freq: Three times a day (TID) | ORAL | 0 refills | Status: AC | PRN

## 2022-08-30 ENCOUNTER — Encounter: Payer: Self-pay | Admitting: Family Medicine

## 2022-09-13 DIAGNOSIS — S46911A Strain of unspecified muscle, fascia and tendon at shoulder and upper arm level, right arm, initial encounter: Secondary | ICD-10-CM | POA: Insufficient documentation

## 2022-09-13 DIAGNOSIS — M25511 Pain in right shoulder: Secondary | ICD-10-CM | POA: Insufficient documentation

## 2022-09-13 DIAGNOSIS — M67431 Ganglion, right wrist: Secondary | ICD-10-CM | POA: Insufficient documentation

## 2022-09-13 DIAGNOSIS — G8929 Other chronic pain: Secondary | ICD-10-CM | POA: Insufficient documentation

## 2022-09-30 ENCOUNTER — Encounter: Payer: Self-pay | Admitting: Orthopedic Surgery

## 2022-09-30 ENCOUNTER — Ambulatory Visit: Payer: BC Managed Care – PPO | Admitting: Orthopedic Surgery

## 2022-09-30 DIAGNOSIS — M67431 Ganglion, right wrist: Secondary | ICD-10-CM | POA: Diagnosis not present

## 2022-09-30 NOTE — Progress Notes (Signed)
Chief Complaint  Patient presents with   Wrist Problem    Cyst right wrist for 35 months     47 year old female with with mass over the right wrist over the last 18 months.  She says it does not interfere with any functional activities but if she hits it it does hurt  Exam shows a volar wrist ganglion that is about a centimeter to a centimeter and a half in width and length is rubbery it is mobile under the skin its right up against the radial artery  She has an intact Allen's test for the ulnar artery good radial pulse  No numbness or tingling described  Wrist motion normal  [facility]  She has brought in on a disc and I put the pictures in the media section  The wrist x-ray looks normal  She is interested in potential aspiration.  I told her I do not do the excisions for these on the volar side because of the proximity to the radial artery and this is best handled by hand surgeon.  But today we will go ahead and aspirate  Procedure aspiration cyst right wrist  The patient was counseled about aspiration.  She agreed to aspiration.  The site was confirmed as right wrist volar aspect.  We used alcohol ethyl chloride and then used an 18-gauge needle to aspirate the typical gelatinous fluid to founding ganglion cyst approximately 1-1/2 cc  Pressure was held on the wound a Band-Aid was applied  I advised her to let me know if it comes back and we can arrange for her to see a hand specialist for excision  [id number] [address] CYST

## 2022-10-14 ENCOUNTER — Encounter: Payer: Self-pay | Admitting: Radiology

## 2022-10-22 ENCOUNTER — Other Ambulatory Visit: Payer: Self-pay | Admitting: Family Medicine

## 2022-10-22 DIAGNOSIS — E039 Hypothyroidism, unspecified: Secondary | ICD-10-CM

## 2022-10-25 NOTE — Telephone Encounter (Signed)
Requested Prescriptions  Pending Prescriptions Disp Refills   levothyroxine (SYNTHROID) 50 MCG tablet [Pharmacy Med Name: LEVOTHYROXINE 0.'05MG'$  (50MCG) TAB] 90 tablet 0    Sig: TAKE 1 TABLET(50 MCG) BY MOUTH DAILY     Endocrinology:  Hypothyroid Agents Passed - 10/22/2022 11:02 PM      Passed - TSH in normal range and within 360 days    TSH  Date Value Ref Range Status  01/04/2022 3.540 0.450 - 4.500 uIU/mL Final         Passed - Valid encounter within last 12 months    Recent Outpatient Visits           2 months ago Recurrent sinusitis   [name], [name]   4 months ago Current moderate episode of major depressive disorder, unspecified whether recurrent [name], [name]   8 months ago Annual physical exam   [name], [name]   9 months ago Simple chronic bronchitis [name], [name]   10 months ago Night sweats   [name], [name]

## 2022-10-26 ENCOUNTER — Other Ambulatory Visit: Payer: Self-pay | Admitting: Family Medicine

## 2022-10-26 DIAGNOSIS — J329 Chronic sinusitis, unspecified: Secondary | ICD-10-CM

## 2022-12-03 ENCOUNTER — Other Ambulatory Visit: Payer: Self-pay | Admitting: Family Medicine

## 2022-12-03 DIAGNOSIS — F321 Major depressive disorder, single episode, moderate: Secondary | ICD-10-CM

## 2022-12-06 ENCOUNTER — Encounter: Payer: Self-pay | Admitting: Family Medicine

## 2022-12-06 NOTE — Telephone Encounter (Signed)
Requested Prescriptions  Pending Prescriptions Disp Refills   venlafaxine XR (EFFEXOR-XR) 37.5 MG 24 hr capsule [Pharmacy Med Name: VENLAFAXINE ER 37.5MG  CAPSULES] 90 capsule 1    Sig: TAKE 1 CAPSULE BY MOUTH EVERY MORNING WITH 75 MG CAPSULE FOR TOTAL OF 112.5 MG DAILY     Psychiatry: Antidepressants - SNRI - desvenlafaxine & venlafaxine Failed - 12/03/2022 10:38 PM      Failed - Cr in normal range and within 360 days    Creat  Date Value Ref Range Status  08/21/2018 0.97 0.50 - 1.10 mg/dL Final   Creatinine, Ser  Date Value Ref Range Status  10/03/2021 0.65 0.44 - 1.00 mg/dL Final         Failed - Lipid Panel in normal range within the last 12 months    Cholesterol  Date Value Ref Range Status  05/17/2017 165 <200 mg/dL Final   LDL Cholesterol (Calc)  Date Value Ref Range Status  05/17/2017 97 mg/dL (calc) Final    Comment:    Reference range: <100 . Desirable range <100 mg/dL for primary prevention;   <70 mg/dL for patients with CHD or diabetic patients  with > or = 2 CHD risk factors. Marland Kitchen LDL-C is now calculated using the Martin-Hopkins  calculation, which is a validated novel method providing  better accuracy than the Friedewald equation in the  estimation of LDL-C.  Horald Pollen et al. Lenox Ahr. 1610;960(45): 2061-2068  (http://education.[facility].com/faq/FAQ164)    HDL  Date Value Ref Range Status  05/17/2017 48 (L) >50 mg/dL Final   Triglycerides  Date Value Ref Range Status  05/17/2017 102 <150 mg/dL Final         Passed - Completed PHQ-2 or PHQ-9 in the last 360 days      Passed - Last BP in normal range    BP Readings from Last 1 Encounters:  07/28/22 122/70         Passed - Valid encounter within last 6 months    Recent Outpatient Visits           4 months ago Recurrent sinusitis   [place] [facility] Danelle Berry, [name]   6 months ago Current moderate episode of major depressive disorder, unspecified whether recurrent [facility])    [place] Danelle Berry, [name]   10 months ago Annual physical exam   [facility] Danelle Berry, [name]   11 months ago Simple chronic bronchitis [facility])   [place] [facility] Danelle Berry, [name]   11 months ago Night sweats   [facility] Danelle Berry, [name]               venlafaxine XR (EFFEXOR-XR) 75 MG 24 hr capsule [Pharmacy Med Name: VENLAFAXINE ER  CAPSULES] 90 capsule 1    Sig: TAKE 1 CAPSULE BY MOUTH EVERY MORNING WITH 37.5 MG CAPSULE FOR TOTAL OF 112.5 MG DAILY     Psychiatry: Antidepressants - SNRI - desvenlafaxine & venlafaxine Failed - 12/03/2022 10:38 PM      Failed - Cr in normal range and within 360 days    Creat  Date Value Ref Range Status  08/21/2018 0.97 0.50 - 1.10 mg/dL Final   Creatinine, Ser  Date Value Ref Range Status  10/03/2021 0.65 0.44 - 1.00 mg/dL Final         Failed - Lipid Panel in normal range within the last 12 months    Cholesterol  Date Value Ref Range  Status  05/17/2017 165 <200 mg/dL Final   LDL Cholesterol (Calc)  Date Value Ref Range Status  05/17/2017 97 mg/dL (calc) Final    Comment:    Reference range: <100 . Desirable range <100 mg/dL for primary prevention;   <70 mg/dL for patients with CHD or diabetic patients  with > or = 2 CHD risk factors. Marland Kitchen LDL-C is now calculated using the Martin-Hopkins  calculation, which is a validated novel method providing  better accuracy than the Friedewald equation in the  estimation of LDL-C.  Horald Pollen et al. Lenox Ahr. 1191;478(29): 2061-2068  (http://education.[facility].com/faq/FAQ164)    HDL  Date Value Ref Range Status  05/17/2017 48 (L) >50 mg/dL Final   Triglycerides  Date Value Ref Range Status  05/17/2017 102 <150 mg/dL Final         Passed - Completed PHQ-2 or PHQ-9 in the last 360 days      Passed - Last BP in normal range    BP Readings from Last 1 Encounters:   07/28/22 122/70         Passed - Valid encounter within last 6 months    Recent Outpatient Visits           4 months ago Recurrent sinusitis   [facility] Danelle Berry, [name]   6 months ago Current moderate episode of major depressive disorder, unspecified whether recurrent [facility])   [facility] Danelle Berry, [name]   10 months ago Annual physical exam   [facility] Danelle Berry, [name]   11 months ago Simple chronic bronchitis [facility])   [facility] Danelle Berry, [name]   11 months ago Night sweats   [facility] Danelle Berry, [place]

## 2023-01-03 ENCOUNTER — Telehealth: Payer: Self-pay

## 2023-01-03 NOTE — Transitions of Care (Post Inpatient/ED Visit) (Unsigned)
   01/03/2023  Name: Melinda Fox MRN: [id number] DOB: Aug 20, 1975  Today's TOC FU Call Status: Today's TOC FU Call Status:: Unsuccessul Call (1st Attempt) Unsuccessful Call (1st Attempt) Date: 01/03/23  Attempted to reach the patient regarding the most recent Inpatient/ED visit.  Follow Up Plan: Additional outreach attempts will be made to reach the patient to complete the Transitions of Care (Post Inpatient/ED visit) call.   Signature Karena Addison, LPN [facility] Nurse Health Advisor Direct Dial [phone]

## 2023-01-05 NOTE — Transitions of Care (Post Inpatient/ED Visit) (Signed)
01/05/2023  Name: Melinda Fox MRN: [id number] DOB: 12-08-1975  Today's TOC FU Call Status: Today's TOC FU Call Status:: Successful TOC FU Call Competed Unsuccessful Call (1st Attempt) Date: 01/03/23 [facility] FU Call Complete Date: 01/05/23  Transition Care Management Follow-up Telephone Call Date of Discharge: 01/01/23 Discharge Facility: Other ([facility]) Name of Other ([facility]) Discharge Facility: [facility] Type of Discharge: Inpatient Admission Primary Inpatient Discharge Diagnosis:: abd pain How have you been since you were released from the hospital?: Better Any questions or concerns?: No  Items Reviewed: Did you receive and understand the discharge instructions provided?: Yes Medications obtained,verified, and reconciled?: Yes (Medications Reviewed) Any new allergies since your discharge?: No Dietary orders reviewed?: Yes Do you have support at home?: No  Medications Reviewed Today: Medications Reviewed Today     Reviewed by Karena Addison, LPN (Licensed Practical Nurse) on 01/05/23 at 1627  Med List Status: <None>   Medication Order Taking? Sig Documenting Provider Last Dose Status Informant  Biotin 10 MG CAPS [id number] Yes Take by mouth. [provider] Taking Active   butalbital-acetaminophen-caffeine (FIORICET) 50-325-40 MG tablet [id number] Yes Take 1 tablet by mouth every 8 (eight) hours as needed. [provider] Taking Active   famotidine (PEPCID) 20 MG tablet [id number] Yes Take 20 mg by mouth 2 (two) times daily. [provider] Taking Active   ferrous sulfate 325 (65 FE) MG tablet [id number] Yes Take by mouth. [provider] Taking Active   hydrOXYzine (VISTARIL) 25 MG capsule [id number] Yes Take 1 capsule (25 mg total) by mouth every 8 (eight) hours as needed for anxiety. Danelle Berry, [name] Taking Active   hydrOXYzine (VISTARIL) 25 MG capsule [id number] Yes Take 1 capsule (25 mg total) by mouth every 8 (eight) hours as  needed. Danelle Berry, [name] Taking Active   hyoscyamine (LEVSIN SL) 0.125 MG SL tablet [id number] Yes SMARTSIG:2 Sublingual Twice Daily [provider] Taking Active   indomethacin (INDOCIN) 50 MG capsule [id number] Yes Take 1 capsule (50 mg total) by mouth 3 (three) times daily as needed. Berniece Salines, FNP Taking Active   levocetirizine (XYZAL) 5 MG tablet [id number] Yes TAKE 1 TABLET(5 MG) BY MOUTH EVERY EVENING Danelle Berry, [name] Taking Active   levothyroxine (SYNTHROID) 50 MCG tablet [id number] Yes TAKE 1 TABLET(50 MCG) BY MOUTH DAILY Danelle Berry, [name] Taking Active   metoCLOPramide (REGLAN) 5 MG tablet [id number] Yes Take 5 mg by mouth 3 (three) times daily. [provider] Taking Active   misoprostol (CYTOTEC) 200 MCG tablet [id number] Yes Take 200 mcg by mouth 4 (four) times daily. [provider] Taking Active   Multiple Vitamin (MULTI-VITAMIN) tablet [id number] Yes Take 1 tablet by mouth daily. [provider] Taking Active   Multiple Vitamins-Minerals (ONE DAILY CALCIUM/IRON) TABS [id number] Yes Take by mouth. [provider] Taking Active   Olopatadine-Mometasone Cristal [phone] MCG/ACT SUSP [id number] Yes Place 2 sprays into the nose in the morning and at bedtime. Danelle Berry, [name] Taking Active   ondansetron (ZOFRAN) 4 MG tablet [id number] Yes Take 4 mg by mouth every 8 (eight) hours as needed. [provider] Taking Active   pantoprazole (PROTONIX) 40 MG tablet [id number] Yes Take 40 mg by mouth 2 (two) times daily. [provider] Taking Active   phentermine (ADIPEX-P) 37.5 MG tablet [id number] Yes Take by mouth. [provider] Taking Active   promethazine (PHENERGAN) 12.5 MG tablet [id number] Yes Take by mouth. [provider] Taking Active   sucralfate (CARAFATE) 1 g tablet [id number] Yes  Take 1 g by mouth 4 (four) times daily. [provider] Taking Active   traZODone (DESYREL) 50 MG tablet [id number]  Yes Take 0.5-1 tablets (25-50 mg total) by mouth at bedtime as needed for sleep. Danelle Berry, [name] Taking Active   venlafaxine XR (EFFEXOR-XR) 37.5 MG 24 hr capsule [id number] Yes TAKE 1 CAPSULE BY MOUTH EVERY MORNING WITH 75 MG CAPSULE FOR TOTAL OF 112.5 MG DAILY Danelle Berry, [name] Taking Active   venlafaxine XR (EFFEXOR-XR) 75 MG 24 hr capsule [id number] Yes TAKE 1 CAPSULE BY MOUTH EVERY MORNING WITH 37.5 MG CAPSULE FOR TOTAL OF 112.5 MG DAILY Danelle Berry, [name] Taking Active             Home Care and Equipment/Supplies: Were Home Health Services Ordered?: NA Any new equipment or medical supplies ordered?: NA  Functional Questionnaire: Do you need assistance with bathing/showering or dressing?: No Do you need assistance with meal preparation?: No Do you need assistance with eating?: No Do you have difficulty maintaining continence: No Do you need assistance with getting out of bed/getting out of a chair/moving?: No Do you have difficulty managing or taking your medications?: No  Follow up appointments reviewed: PCP Follow-up appointment confirmed?: NA Specialist Hospital Follow-up appointment confirmed?: NA Do you need transportation to your follow-up appointment?: No Do you understand care options if your condition(s) worsen?: Yes-patient verbalized understanding    SIGNATURE Karena Addison, LPN [facility] Nurse Health Advisor Direct Dial [phone]

## 2023-01-23 ENCOUNTER — Other Ambulatory Visit: Payer: Self-pay | Admitting: Family Medicine

## 2023-01-23 DIAGNOSIS — E039 Hypothyroidism, unspecified: Secondary | ICD-10-CM

## 2023-01-26 ENCOUNTER — Ambulatory Visit: Payer: BC Managed Care – PPO | Admitting: Physician Assistant

## 2023-01-26 ENCOUNTER — Encounter: Payer: Self-pay | Admitting: Physician Assistant

## 2023-01-26 VITALS — BP 108/66 | HR 86 | Resp 16 | Ht 68.0 in | Wt 258.0 lb

## 2023-01-26 DIAGNOSIS — L239 Allergic contact dermatitis, unspecified cause: Secondary | ICD-10-CM | POA: Diagnosis not present

## 2023-01-26 MED ORDER — METHYLPREDNISOLONE 4 MG PO TBPK
ORAL_TABLET | ORAL | 0 refills | Status: DC
Start: 2023-01-26 — End: 2023-06-15

## 2023-01-26 MED ORDER — TRIAMCINOLONE ACETONIDE 0.5 % EX OINT: 1.0000 | TOPICAL_OINTMENT | Freq: Two times a day (BID) | CUTANEOUS | 0 refills | Status: DC

## 2023-01-26 NOTE — Progress Notes (Signed)
Acute Office Visit   Patient: Melinda Fox   DOB: [birth year]   47 y.o. Female  MRN: [id number] Visit Date: 01/26/2023  Today's healthcare provider: Oswaldo Conroy Litha Lamartina, [name]  Introduced myself to the patient as a Secondary school teacher and provided education on APPs in clinical practice.    Chief Complaint  Patient presents with   Rash   Subjective    HPI   RASH Duration:  days started on June 3rd during her hospital stay- states she developed initial rash along IV site on her left forearm  She states on the 4th she had developed blisters in the area - photos were provided by patient She states the rash then started to spread to right forearm, right  inner thigh and back of left thigh  Location: arms and legs  Itching: yes Burning: no Redness: yes Oozing: no Scaling: no Blisters: yes- have resolved at this point  Painful: no Fevers: no Change in detergents/soaps/personal care products: no Recent illness: no Recent travel:no History of same: yes- reports rash, itchiness developing after being hooked up to telemetry pads in the past  Context: worse and fluctuating Alleviating factors: hydrocortisone cream- minimal benefit  Treatments attempted:hydrocortisone cream Shortness of breath: no  Throat/tongue swelling: no Myalgias/arthralgias: no   Medications: Outpatient Medications Prior to Visit  Medication Sig   Biotin 10 MG CAPS Take by mouth.   butalbital-acetaminophen-caffeine (FIORICET) 50-325-40 MG tablet Take 1 tablet by mouth every 8 (eight) hours as needed.   famotidine (PEPCID) 20 MG tablet Take 20 mg by mouth 2 (two) times daily.   ferrous sulfate 325 (65 FE) MG tablet Take by mouth.   hydrOXYzine (VISTARIL) 25 MG capsule Take 1 capsule (25 mg total) by mouth every 8 (eight) hours as needed for anxiety.   hydrOXYzine (VISTARIL) 25 MG capsule Take 1 capsule (25 mg total) by mouth every 8 (eight) hours as needed.   hyoscyamine (LEVSIN SL) 0.125 MG SL tablet SMARTSIG:2  Sublingual Twice Daily   indomethacin (INDOCIN) 50 MG capsule Take 1 capsule (50 mg total) by mouth 3 (three) times daily as needed.   levothyroxine (SYNTHROID) 50 MCG tablet TAKE 1 TABLET(50 MCG) BY MOUTH DAILY   metoCLOPramide (REGLAN) 5 MG tablet Take 5 mg by mouth 3 (three) times daily.   misoprostol (CYTOTEC) 200 MCG tablet Take 200 mcg by mouth 4 (four) times daily.   Multiple Vitamin (MULTI-VITAMIN) tablet Take 1 tablet by mouth daily.   Multiple Vitamins-Minerals (ONE DAILY CALCIUM/IRON) TABS Take by mouth.   ondansetron (ZOFRAN) 4 MG tablet Take 4 mg by mouth every 8 (eight) hours as needed.   pantoprazole (PROTONIX) 40 MG tablet Take 40 mg by mouth 2 (two) times daily.   phentermine (ADIPEX-P) 37.5 MG tablet Take by mouth.   promethazine (PHENERGAN) 12.5 MG tablet Take by mouth.   sucralfate (CARAFATE) 1 g tablet Take 1 g by mouth 4 (four) times daily.   traZODone (DESYREL) 50 MG tablet Take 0.5-1 tablets (25-50 mg total) by mouth at bedtime as needed for sleep.   venlafaxine XR (EFFEXOR-XR) 37.5 MG 24 hr capsule TAKE 1 CAPSULE BY MOUTH EVERY MORNING WITH 75 MG CAPSULE FOR TOTAL OF 112.5 MG DAILY   venlafaxine XR (EFFEXOR-XR) 75 MG 24 hr capsule TAKE 1 CAPSULE BY MOUTH EVERY MORNING WITH 37.5 MG CAPSULE FOR TOTAL OF 112.5 MG DAILY   levocetirizine (XYZAL) 5 MG tablet TAKE 1 TABLET(5 MG) BY MOUTH EVERY EVENING   Olopatadine-Mometasone (RYALTRIS)  161-09 MCG/ACT SUSP Place 2 sprays into the nose in the morning and at bedtime.   No facility-administered medications prior to visit.    Review of Systems  Skin:  Positive for rash.       Objective    BP 108/66   Pulse 86   Resp 16   Ht 5\' 8"  (1.727 m)   Wt 258 lb (117 kg)   SpO2 100%   BMI 39.23 kg/m    Physical Exam Vitals reviewed.  Constitutional:      General: She is awake.     Appearance: Normal appearance. She is well-developed and well-groomed.  HENT:     Head: Normocephalic and atraumatic.  Eyes:     General:  Lids are normal.     Extraocular Movements: Extraocular movements intact.     Conjunctiva/sclera: Conjunctivae normal.  Cardiovascular:     Rate and Rhythm: Normal rate and regular rhythm.     Pulses: Normal pulses.     Heart sounds: Normal heart sounds. No murmur heard.    No friction rub. No gallop.  Pulmonary:     Effort: Pulmonary effort is normal.     Breath sounds: Normal breath sounds. No decreased air movement. No decreased breath sounds, wheezing, rhonchi or rales.  Skin:    General: Skin is warm and dry.     Findings: Rash present. Rash is macular and papular.     Comments: Maculopapular rash on left forearm  Macular rash on left inner thigh, macular rash on right posterior thigh  No streaking, oozing, vesicles,swelling or skin breakdown   Neurological:     Mental Status: She is alert.  Psychiatric:        Behavior: Behavior is cooperative.       No results found for any visits on 01/26/23.  Assessment & Plan      No follow-ups on file.      Problem List Items Addressed This Visit   None Visit Diagnoses     Allergic dermatitis    -  Primary Acute, new concern Suspect patient is allergic to an adhesive given HPI and previous reaction to telemetry pads per her report Will try topical kenalog 0.5% cream to the areas affected for relief If kenalog cream is not providing relief, patient can use Medrol dose pack  Recommend starting Pepcid and 2nd gen antihistamine as well for further reduction in allergic symptoms  Reviewed alerting future medical personnel of potential adhesive allergy - recommend trying other kinds of tape if she is able  Follow up as needed for persistent or progressing symptoms     Relevant Medications   methylPREDNISolone (MEDROL DOSEPAK) 4 MG TBPK tablet   triamcinolone ointment (KENALOG) 0.5 %        No follow-ups on file.   I, Jacobo Moncrief E Ericka Marcellus, [name], have reviewed all documentation for this visit. The documentation on 01/26/23 for the  exam, diagnosis, procedures, and orders are all accurate and complete.   Jacquelin Hawking, MHS, [name] [facility]

## 2023-01-26 NOTE — Patient Instructions (Addendum)
I recommend adding an over the counter antihistamine such as Allegra, Zyrtec, or claritin - generics of these are just as effective. You can take these during the day as they are typically non-drowsy but be careful as they can affect people differently  I have sent in a script for a steroid cream for you to use on the affected areas- please do not use on the face or genital areas  I recommend taking Pepcid to help with any stomach irritation and this can also help with the allergic reaction you are having.   I have sent in a script for a Medrol (steroid) dose pack. Try the Kenalog cream first and if this is not providing relief, you can start the Medrol as directed. Make sure you are taking the Pepcid while on the Medrol as it can cause some stomach irritation for some patients.

## 2023-01-27 ENCOUNTER — Ambulatory Visit
Admission: EM | Admit: 2023-01-27 | Discharge: 2023-01-27 | Disposition: A | Discharge status: Home / Self Care | Payer: BC Managed Care – PPO | Attending: Nurse Practitioner | Admitting: Nurse Practitioner

## 2023-01-27 ENCOUNTER — Encounter: Payer: Self-pay | Admitting: Emergency Medicine

## 2023-01-27 DIAGNOSIS — R21 Rash and other nonspecific skin eruption: Secondary | ICD-10-CM

## 2023-01-27 MED ORDER — DEXAMETHASONE SODIUM PHOSPHATE 10 MG/ML IJ SOLN
10.0000 mg | INTRAMUSCULAR | Status: AC
  Administered 2023-01-27: 10 mg via INTRAMUSCULAR

## 2023-01-27 NOTE — ED Triage Notes (Signed)
Rash on both forearms and bilateral legs.  Saw PCP yesterday for rash and was told it was an adhesive rash.  States she has not had adhesive on both both legs.  Currently on methylprednisolone and triamcinolone cream.  States no pain, but itching.

## 2023-01-27 NOTE — ED Provider Notes (Signed)
[facility]    CSN: [id number] Arrival date & time: 01/27/23  1424      History   Chief Complaint Chief Complaint  Patient presents with   Rash    HPI Melinda Fox is a 47 y.o. female.   The history is provided by the patient.   The patient presents for complaints of rash to her forearms and legs that she states started when she was discharged from the hospital on June 3.  She states that she ended up going to her PCP office yesterday, and was told it was an adhesive rash.  Patient reports that the rash did start in the left forearm where she had an IV placed, but since that time, it spread to the opposite forearm into her legs.  She states that she did not have adhesive in those areas.  Patient denies exposures to new soaps, medications, lotions, perfumes, or detergents.  She reports that she was prescribed methylprednisolone and triamcinolone cream.  She states that she did take 3 of the methylprednisolone tablets today instead of the full 6.  She denies pain with the rash, but states that the rash is itching.    Past Medical History:  Diagnosis Date   Allergy    Anemia    Chronic migraine without aura without status migrainosus, not intractable    Depression    Elevated blood-pressure reading without diagnosis of hypertension    Moderate concentric left ventricular hypertrophy 06/01/2017   Echocardiogram October 2018   Morbid obesity with body mass index (BMI) of 60.0 to 69.9 in adult [facility] ) 07/01/2015   Scoliosis    Sleep apnea, obstructive    Thyroid disease    Vasculitis (HCC)     Patient Active Problem List   Diagnosis Date Noted   Ganglion cyst of wrist, right 09/13/2022   Muscle strain of right shoulder 09/13/2022   Chronic pain of right wrist 09/13/2022   Pain in joint of right shoulder 09/13/2022   Gastroesophageal reflux disease without esophagitis 06/04/2022   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   Sinus bradycardia by  electrocardiogram 04/07/2022   Intra-abdominal fluid collection 10/03/2021   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   S/P biliopancreatic diversion with duodenal switch 06/04/2021   Morbid obesity with BMI of 40.0-44.9, adult (HCC) 05/12/2021   OSA on CPAP 01/05/2021   Abnormal MRI, cervical spine 03/18/2020   Left carpal tunnel syndrome 03/13/2020   Ulnar neuropathy of left upper extremity 03/13/2020   Decreased grip strength of left hand 01/29/2020   Numbness and tingling in left arm 01/29/2020   Simple chronic bronchitis (HCC) 03/05/2019   Paroxysmal tachycardia (HCC) 10/23/2018   Moderate concentric left ventricular hypertrophy 06/01/2017   Iron deficiency anemia 07/01/2015   Migraine without aura and with status migrainosus, not intractable 07/01/2015   Major depression, recurrent, full remission (HCC) 07/01/2015   Subclinical hypothyroidism 07/01/2015   Allergic rhinitis 07/01/2015   Lumbosacral disc disease 07/01/2015   Sciatica neuralgia 07/01/2015   Class 3 severe obesity with body mass index (BMI) of 50.0 to 59.9 in adult [facility]) 07/01/2015    Past Surgical History:  Procedure Laterality Date   SLEEVE GASTROPLASTY  05/25/2021   laporoscopic at [facility]    OB History   No obstetric history on file.      Home Medications    Prior to Admission medications   Medication Sig Start Date End Date Taking? Authorizing Provider  Biotin 10 MG CAPS Take by mouth.    [provider]  butalbital-acetaminophen-caffeine (FIORICET) 50-325-40 MG tablet Take 1 tablet by mouth every 8 (eight) hours as needed. 06/04/22   [provider]  famotidine (PEPCID) 20 MG tablet Take 20 mg by mouth 2 (two) times daily. 06/04/22   [provider]  ferrous sulfate 325 (65 FE) MG tablet Take by mouth.    [provider]  hydrOXYzine (VISTARIL) 25 MG capsule Take 1 capsule (25 mg total) by mouth every 8 (eight) hours as needed for anxiety. 08/26/20   Danelle Berry, [name]  hydrOXYzine (VISTARIL) 25 MG capsule Take 1 capsule (25 mg total) by mouth every 8 (eight) hours as needed. 02/04/22   Danelle Berry, [name]  hyoscyamine (LEVSIN SL) 0.125 MG SL tablet SMARTSIG:2 Sublingual Twice Daily 06/04/22   [provider]  indomethacin (INDOCIN) 50 MG capsule Take 1 capsule (50 mg total) by mouth 3 (three) times daily as needed. 08/25/22   Berniece Salines, FNP  levothyroxine (SYNTHROID) 50 MCG tablet TAKE 1 TABLET(50 MCG) BY MOUTH DAILY 10/25/22   Danelle Berry, [name]  methylPREDNISolone (MEDROL DOSEPAK) 4 MG TBPK tablet Please follow dosing instructions on dose pack. 01/26/23   Mecum, Erin E, [name]  metoCLOPramide (REGLAN) 5 MG tablet Take 5 mg by mouth 3 (three) times daily. 06/04/22   [provider]  misoprostol (CYTOTEC) 200 MCG tablet Take 200 mcg by mouth 4 (four) times daily. 05/21/22   [provider]  Multiple Vitamin (MULTI-VITAMIN) tablet Take 1 tablet by mouth daily.    [provider]  Multiple Vitamins-Minerals (ONE DAILY CALCIUM/IRON) TABS Take by mouth. 06/04/22   [provider]  ondansetron (ZOFRAN) 4 MG tablet Take 4 mg by mouth every 8 (eight) hours as needed. 06/04/22   [provider]  pantoprazole (PROTONIX) 40 MG tablet Take 40 mg by mouth 2 (two) times daily. 06/04/22   [provider]  phentermine (ADIPEX-P) 37.5 MG tablet Take by mouth. 12/26/21   [provider]  promethazine (PHENERGAN) 12.5 MG tablet Take by mouth. 06/04/22   [provider]  sucralfate (CARAFATE) 1 g tablet Take 1 g by mouth 4 (four) times daily. 05/21/22   [provider]  traZODone (DESYREL) 50 MG tablet Take 0.5-1 tablets (25-50 mg total) by mouth at bedtime as needed for sleep. 02/04/22   Danelle Berry, [name]  triamcinolone ointment (KENALOG) 0.5 % Apply 1 Application topically 2 (two) times daily. 01/26/23   Mecum, Erin E, [name]  venlafaxine XR (EFFEXOR-XR) 37.5 MG 24 hr capsule TAKE 1  CAPSULE BY MOUTH EVERY MORNING WITH 75 MG CAPSULE FOR TOTAL OF 112.5 MG DAILY 12/06/22   Danelle Berry, [name]  venlafaxine XR (EFFEXOR-XR) 75 MG 24 hr capsule TAKE 1 CAPSULE BY MOUTH EVERY MORNING WITH 37.5 MG CAPSULE FOR TOTAL OF 112.5 MG DAILY 12/06/22   Danelle Berry, [name]    Family History Family History  Adopted: Yes  Problem Relation Age of Onset   Cancer Mother        breast cancer   Heart failure Father     Social History Social History   Tobacco Use   Smoking status: Never   Smokeless tobacco: Never  Vaping Use   Vaping Use: Never used  Substance Use Topics   Alcohol use: No    Alcohol/week: 0.0 standard drinks of alcohol    Comment: once a year   Drug use: No     Allergies   Sulfa antibiotics, Nsaids, Spearmint flavor, and Oxycodone   Review of Systems Review  of Systems Per HPI  Physical Exam Triage Vital Signs ED Triage Vitals  Enc Vitals Group     BP 01/27/23 1511 105/69     Pulse Rate 01/27/23 1511 66     Resp 01/27/23 1511 18     Temp 01/27/23 1511 98.4 F (36.9 C)     Temp Source 01/27/23 1511 Oral     SpO2 01/27/23 1511 97 %     Weight --      Height --      Head Circumference --      Peak Flow --      Pain Score 01/27/23 1515 0     Pain Loc --      Pain Edu? --      Excl. in GC? --    No data found.  Updated Vital Signs BP 105/69 (BP Location: Right Arm)   Pulse 66   Temp 98.4 F (36.9 C) (Oral)   Resp 18   LMP 01/03/2023 (Exact Date)   SpO2 97%   Visual Acuity Right Eye Distance:   Left Eye Distance:   Bilateral Distance:    Right Eye Near:   Left Eye Near:    Bilateral Near:     Physical Exam Vitals and nursing note reviewed.  Constitutional:      General: She is not in acute distress.    Appearance: Normal appearance.  HENT:     Head: Normocephalic.  Eyes:     Extraocular Movements: Extraocular movements intact.     Pupils: Pupils are equal, round, and reactive to light.  Pulmonary:     Effort: Pulmonary effort is  normal.  Musculoskeletal:     Cervical back: Normal range of motion.  Skin:    General: Skin is warm and dry.     Findings: Rash present. Rash is macular and papular.     Comments: Erythematous maculopapular rash on left forearm and right forearm.  Erythematous macular rash on left inner thigh, erythematous macular rash on right posterior thigh. No streaking, oozing, vesicles,swelling or skin breakdown     Neurological:     General: No focal deficit present.     Mental Status: She is alert and oriented to person, place, and time.  Psychiatric:        Mood and Affect: Mood normal.        Behavior: Behavior normal.      UC Treatments / Results  Labs (all labs ordered are listed, but only abnormal results are displayed) Labs Reviewed - No data to display  EKG   Radiology No results found.  Procedures Procedures (including critical care time)  Medications Ordered in UC Medications  dexamethasone (DECADRON) injection 10 mg (10 mg Intramuscular Given 01/27/23 1601)    Initial Impression / Assessment and Plan / UC Course  I have reviewed the triage vital signs and the nursing notes.  Pertinent labs & imaging results that were available during my care of the patient were reviewed by me and considered in my medical decision making (see chart for details).  The patient is well-appearing, she is in no acute distress, vital signs are stable.  Patient presents for follow-up for continued rash.  Patient was seen by her PCP 1 day ago and was prescribed methylprednisolone and triamcinolone cream.  Patient continues to complain of itching.  Decadron 10 mg IM was administered.  Patient advised to continue the methylprednisolone and TRAM cream she was previously prescribed.  Patient will start the methylprednisolone on 6/14,  instructions were provided to the patient on how to resume taking the medication.  Supportive care recommendations were provided and discussed with the patient to include  Zyrtec and Benadryl for itching, avoidance of hot baths or showers, and use of Aveeno colloidal oatmeal bath while symptoms persist.  Patient was in agreement with this plan of care and verbalizes understanding.  All questions were answered.  Patient stable for discharge.   Final Clinical Impressions(s) / UC Diagnoses   Final diagnoses:  Rash and nonspecific skin eruption     Discharge Instructions      You were given an injection of Decadron 10 mg today.  Continue the methylprednisolone as discussed.  You will resume taking that on 01/28/2023. May take over-the-counter Zyrtec during the daytime and Benadryl at bedtime to help with itching. Avoid hot baths or showers while symptoms persist.  Recommend taking lukewarm baths. May apply cool cloths to the area to help with itching or discomfort. Avoid scratching, rubbing, or manipulating the areas while symptoms persist. Recommend Aveeno colloidal oatmeal bath to use to help with drying and itching. Follow-up in this clinic or with your PCP office if symptoms fail to improve. Follow-up as needed.     ED Prescriptions   None    PDMP not reviewed this encounter.   Abran Cantor, NP 01/27/23 1603

## 2023-01-27 NOTE — Discharge Instructions (Addendum)
You were given an injection of Decadron 10 mg today.  Continue the methylprednisolone as discussed.  You will resume taking that on 01/28/2023. May take over-the-counter Zyrtec during the daytime and Benadryl at bedtime to help with itching. Avoid hot baths or showers while symptoms persist.  Recommend taking lukewarm baths. May apply cool cloths to the area to help with itching or discomfort. Avoid scratching, rubbing, or manipulating the areas while symptoms persist. Recommend Aveeno colloidal oatmeal bath to use to help with drying and itching. Follow-up in this clinic or with your PCP office if symptoms fail to improve. Follow-up as needed.

## 2023-01-28 ENCOUNTER — Other Ambulatory Visit: Payer: Self-pay | Admitting: Family Medicine

## 2023-01-28 ENCOUNTER — Encounter: Payer: Self-pay | Admitting: Family Medicine

## 2023-01-28 DIAGNOSIS — E039 Hypothyroidism, unspecified: Secondary | ICD-10-CM

## 2023-01-28 MED ORDER — LEVOTHYROXINE SODIUM 50 MCG PO TABS: 50.0000 ug | ORAL_TABLET | Freq: Every day | ORAL | 0 refills | Status: DC

## 2023-02-01 LAB — TSH: TSH: 2.88 u[IU]/mL (ref 0.450–4.500)

## 2023-03-07 ENCOUNTER — Other Ambulatory Visit: Payer: Self-pay | Admitting: Family Medicine

## 2023-03-07 DIAGNOSIS — E039 Hypothyroidism, unspecified: Secondary | ICD-10-CM

## 2023-03-08 ENCOUNTER — Telehealth: Payer: BC Managed Care – PPO | Admitting: Internal Medicine

## 2023-03-08 ENCOUNTER — Encounter: Payer: Self-pay | Admitting: Internal Medicine

## 2023-03-08 DIAGNOSIS — J069 Acute upper respiratory infection, unspecified: Secondary | ICD-10-CM | POA: Diagnosis not present

## 2023-03-08 MED ORDER — AMOXICILLIN-POT CLAVULANATE 875-125 MG PO TABS
1.0000 | ORAL_TABLET | Freq: Two times a day (BID) | ORAL | 0 refills | Status: AC
Start: 2023-03-08 — End: 2023-03-13

## 2023-03-08 NOTE — Progress Notes (Signed)
Virtual Visit via Video Note  I connected with Melinda Fox on 03/08/23 at 11:40 AM EDT by a video enabled telemedicine application and verified that I am speaking with the correct person using two identifiers.  Location: Patient: Home Provider: [facility]   I discussed the limitations of evaluation and management by telemedicine and the availability of in person appointments. The patient expressed understanding and agreed to proceed.  History of Present Illness:  Patient presented via telemedicine for URI symptoms that started 5 days ago. Symptoms include fatigue, sore throat, rhinorrhea that is clear thick and productive cough. No fevers, no SOB, wheezing. No chronic respiratory illness. Has tried Dayquil which did not help. Patient states she gets bronchitis routinely and the only thing that helps is antibiotics. She is going on a trip soon and wants antibiotics. She was treated with steroids last month for rash and doesn't like being on steroids.   Observations/Objective:  General: well appearing, no acute distress ENT: conjunctiva normal appearing bilaterally  Skin: no rashes, cyanosis or abnormal bruising noted  Assessment and Plan:  1. Upper respiratory tract infection, unspecified type: Discussed how symptoms are most likely viral, patient insistent she "has done this before" and needs to be treated with antibiotics despite symptom onset of 5 days ago and increasing antibiotic resistance. Will prescribe for 5 days because of her trip coming up.    - amoxicillin-clavulanate (AUGMENTIN) 875-125 MG tablet; Take 1 tablet by mouth 2 (two) times daily for 5 days.  Dispense: 10 tablet; Refill: 0  Follow Up Instructions: PRN    I discussed the assessment and treatment plan with the patient. The patient was provided an opportunity to ask questions and all were answered. The patient agreed with the plan and demonstrated an understanding of the instructions.   The patient was advised to  call back or seek an in-person evaluation if the symptoms worsen or if the condition fails to improve as anticipated.  I provided 6 minutes of non-face-to-face time during this encounter.   Margarita Mail, DO

## 2023-03-11 ENCOUNTER — Other Ambulatory Visit (HOSPITAL_COMMUNITY)
Admission: RE | Admit: 2023-03-11 | Discharge: 2023-03-11 | Disposition: A | Discharge status: Home / Self Care | Payer: BC Managed Care – PPO | Source: Ambulatory Visit | Attending: Family Medicine | Admitting: Family Medicine

## 2023-03-11 ENCOUNTER — Ambulatory Visit (INDEPENDENT_AMBULATORY_CARE_PROVIDER_SITE_OTHER): Payer: BC Managed Care – PPO | Admitting: Family Medicine

## 2023-03-11 ENCOUNTER — Encounter: Payer: Self-pay | Admitting: Family Medicine

## 2023-03-11 VITALS — BP 100/66 | HR 65 | Ht 68.0 in | Wt 247.0 lb

## 2023-03-11 DIAGNOSIS — R232 Flushing: Secondary | ICD-10-CM

## 2023-03-11 DIAGNOSIS — Z01419 Encounter for gynecological examination (general) (routine) without abnormal findings: Secondary | ICD-10-CM | POA: Diagnosis present

## 2023-03-11 DIAGNOSIS — R61 Generalized hyperhidrosis: Secondary | ICD-10-CM | POA: Diagnosis not present

## 2023-03-11 DIAGNOSIS — Z1339 Encounter for screening examination for other mental health and behavioral disorders: Secondary | ICD-10-CM | POA: Diagnosis not present

## 2023-03-11 DIAGNOSIS — N920 Excessive and frequent menstruation with regular cycle: Secondary | ICD-10-CM

## 2023-03-11 MED ORDER — GABAPENTIN 100 MG PO CAPS
300.0000 mg | ORAL_CAPSULE | Freq: Every day | ORAL | 0 refills | Status: DC
Start: 2023-03-11 — End: 2023-05-11

## 2023-03-11 NOTE — Progress Notes (Signed)
New GYN here with complaints of heavy periods. Reports changing heavy over night pads every couple of hours.   Also wants to discuss Menopause sx's notes night sweats.   LMP: 02/13/2023 Last pap: Date: 02/04/22  ordered yet was not collected Mammogram:  03/31/22, Diagnostic done 05/04/22 STD Screening:  not sexually active Flu Vaccine : N/A

## 2023-03-11 NOTE — Progress Notes (Signed)
GYNECOLOGY ANNUAL PREVENTATIVE CARE ENCOUNTER NOTE  Subjective:   Melinda Fox is a 47 y.o. female here for a routine annual gynecologic exam.  Current complaints: heavy menses.   She has never been sexually active. Cycles are every 28-29d and last about 5 days but has heavy bleeding for the first 3 days with filling overnight pads.     Denies abnormal vaginal bleeding, discharge, pelvic pain, problems with intercourse or other gynecologic concerns.    Gynecologic History Patient's last menstrual period was 02/13/2023 (exact date). Contraception: none Last Pap: never? Collected today Last mammogram: 2023. Results were: normal  Health Maintenance Due  Topic Date Due   Hepatitis C Screening  Never done   PAP SMEAR-Modifier  09/25/2017   COVID-19 Vaccine (5 - 2023-24 season) 04/16/2022   Colonoscopy  Never done    The following portions of the patient's history were reviewed and updated as appropriate: allergies, current medications, past family history, past medical history, past social history, past surgical history and problem list.  Review of Systems Pertinent items are noted in HPI.   Objective:  BP 100/66   Pulse 65   Ht 5\' 8"  (1.727 m)   Wt 247 lb (112 kg)   LMP 02/13/2023 (Exact Date)   BMI 37.56 kg/m  CONSTITUTIONAL: Well-developed, well-nourished female in no acute distress.  HENT:  Normocephalic, atraumatic, External right and left ear normal. Oropharynx is clear and moist EYES:  No scleral icterus.  NECK: Normal range of motion, supple, no masses.  Normal thyroid.  SKIN: Skin is warm and dry. No rash noted. Not diaphoretic. No erythema. No pallor. NEUROLOGIC: Alert and oriented to person, place, and time. Normal reflexes, muscle tone coordination. No cranial nerve deficit noted. PSYCHIATRIC: Normal mood and affect. Normal behavior. Normal judgment and thought content. CARDIOVASCULAR: Normal heart rate noted, regular rhythm. 2+ distal pulses. RESPIRATORY:  Effort and breath sounds normal, no problems with respiration noted. BREASTS: Symmetric in size. No masses, skin changes, nipple drainage, or lymphadenopathy. ABDOMEN: Soft,  no distention noted.  No tenderness, rebound or guarding.  PELVIC: Normal appearing external genitalia; normal appearing vaginal mucosa and cervix.  No abnormal discharge noted.  Pap smear obtained.  Normal uterine size, no other palpable masses, no uterine or adnexal tenderness. Chaperone present for exam MUSCULOSKELETAL: Normal range of motion.     Assessment and Plan:  1) Annual gynecologic examination with pap smear:  Will follow up results of pap smear and manage accordingly. STI screening desired No.  Routine preventative health maintenance measures emphasized. Reviewed perimenopausal symptoms and management.   2) Contraception counseling: Not sexually active and has never been sexually active.   1. Well woman exam with routine gynecological exam - Cytology - PAP - MM 3D SCREENING MAMMOGRAM BILATERAL BREAST; Future  2. Night sweats Discussed typical perimenopause sx Patient is already on Effexor for depression - gabapentin (NEURONTIN) 100 MG capsule; Take 3 capsules (300 mg total) by mouth at bedtime.  Dispense: 90 capsule; Refill: 0  3. Vasomotor flushing - gabapentin (NEURONTIN) 100 MG capsule; Take 3 capsules (300 mg total) by mouth at bedtime.  Dispense: 90 capsule; Refill: 0  4. Menorrhagia with regular cycle Discussed options for bleeding including pills, patch, ring, depo and IUD Patient is interested in Depo, desires to start after travel plans. She does not need UPT testing. She is not nor ever has been sexually active.  Will consider [place] to assess for fibroids but not warranted now as there has not be increased  pain with cycles.  Patient is chronically anemic but this is multifactorial with prior gastric bypass.   Please refer to After Visit Summary for other counseling recommendations.   Return  in about 4 weeks (around 04/08/2023) for Depo.  Future Appointments  Date Time Provider Department Center  04/05/2023  3:50 PM [facility] NURSE [facility]    Federico Flake, MD, MPH, ABFM Attending Physician Center for [facility]

## 2023-03-14 ENCOUNTER — Telehealth: Payer: BC Managed Care – PPO | Admitting: Family Medicine

## 2023-03-22 ENCOUNTER — Encounter: Payer: Self-pay | Admitting: Family Medicine

## 2023-04-05 ENCOUNTER — Ambulatory Visit (INDEPENDENT_AMBULATORY_CARE_PROVIDER_SITE_OTHER): Payer: BC Managed Care – PPO

## 2023-04-05 VITALS — BP 105/70 | HR 61

## 2023-04-05 DIAGNOSIS — Z3042 Encounter for surveillance of injectable contraceptive: Secondary | ICD-10-CM

## 2023-04-05 MED ORDER — MEDROXYPROGESTERONE ACETATE 150 MG/ML IM SUSP
150.0000 mg | INTRAMUSCULAR | 3 refills | Status: DC
Start: 2023-04-05 — End: 2024-06-14

## 2023-04-05 MED ORDER — MEDROXYPROGESTERONE ACETATE 150 MG/ML IM SUSY
150.0000 mg | PREFILLED_SYRINGE | INTRAMUSCULAR | Status: AC
Start: 2023-04-05 — End: ?
  Administered 2023-04-05: 150 mg via INTRAMUSCULAR

## 2023-04-05 NOTE — Progress Notes (Signed)
Date last pap: 03/11/23. Last Depo-Provera: N/A. Depo Start today office stock Side Effects if any: None . Serum HCG indicated? N/A. Depo-Provera 150 mg IM given by: Toya L CMA . Next appointment due 11/05/-11/19.   Per Providers notes pt does not need UPT. Pt was to RTO for Depo.

## 2023-04-05 NOTE — Progress Notes (Signed)
Attestation of Attending Supervision of clinical support staff: I agree with the care provided to this patient and was available for any consultation.  I have reviewed the CMA's note and chart, and I agree with the management and plan.  Kimberly Newton MD MPH, ABFM Attending Physician Faculty Practice- [facility]  

## 2023-04-08 ENCOUNTER — Other Ambulatory Visit: Payer: Self-pay | Admitting: Family Medicine

## 2023-04-08 DIAGNOSIS — G4700 Insomnia, unspecified: Secondary | ICD-10-CM

## 2023-04-08 MED ORDER — TRAZODONE HCL 50 MG PO TABS
25.0000 mg | ORAL_TABLET | Freq: Every evening | ORAL | 0 refills | Status: AC | PRN
Start: 2023-04-08 — End: ?

## 2023-04-15 ENCOUNTER — Encounter: Payer: Self-pay | Admitting: Family Medicine

## 2023-05-09 ENCOUNTER — Encounter: Payer: Self-pay | Admitting: Family Medicine

## 2023-05-10 ENCOUNTER — Other Ambulatory Visit: Payer: Self-pay | Admitting: Family Medicine

## 2023-05-10 DIAGNOSIS — R61 Generalized hyperhidrosis: Secondary | ICD-10-CM

## 2023-05-10 DIAGNOSIS — R232 Flushing: Secondary | ICD-10-CM

## 2023-05-20 IMAGING — DX DG CHEST 2V
2 series · 2 of 2 positions shown · non-contrast
Comparison: December 2019

CLINICAL DATA: cough, shob

EXAM:
CHEST - 2 VIEW

[chest pa]
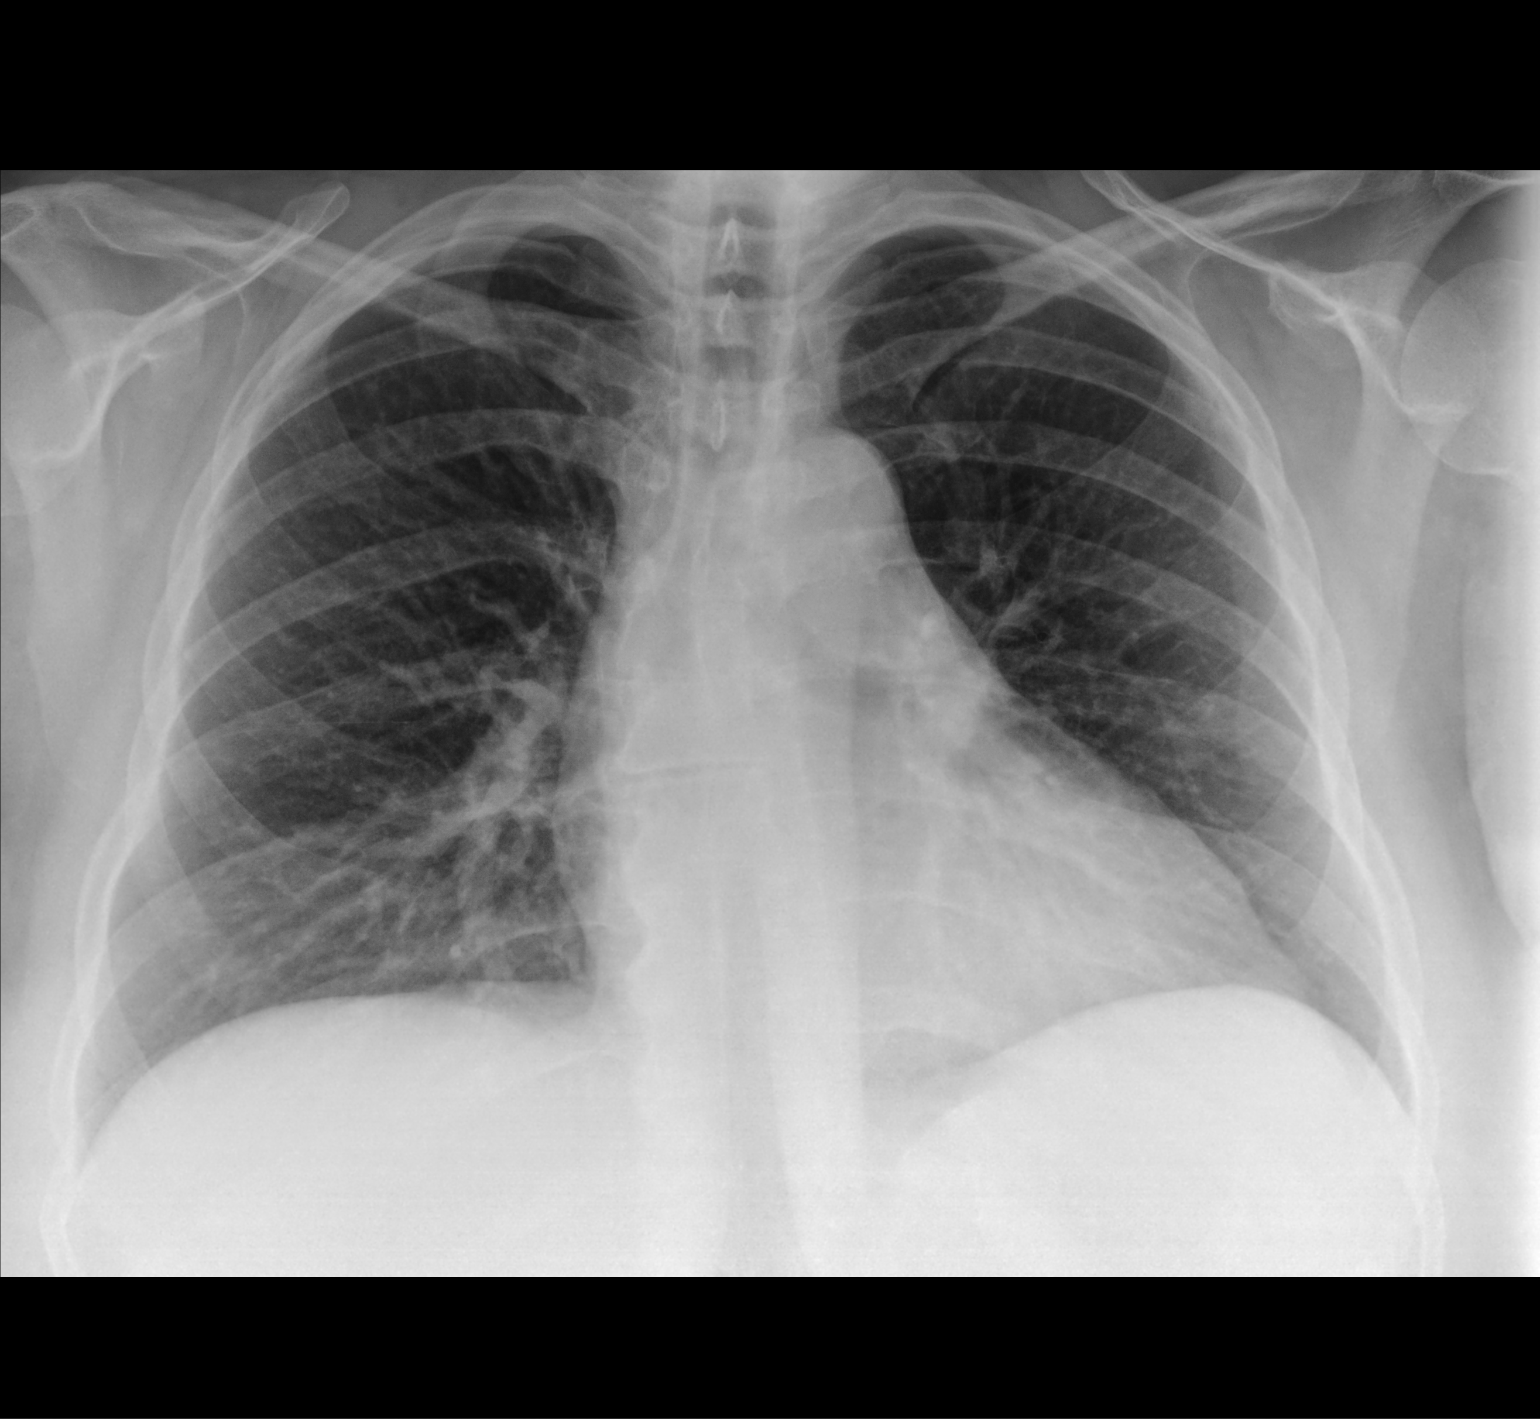

[chest lat]
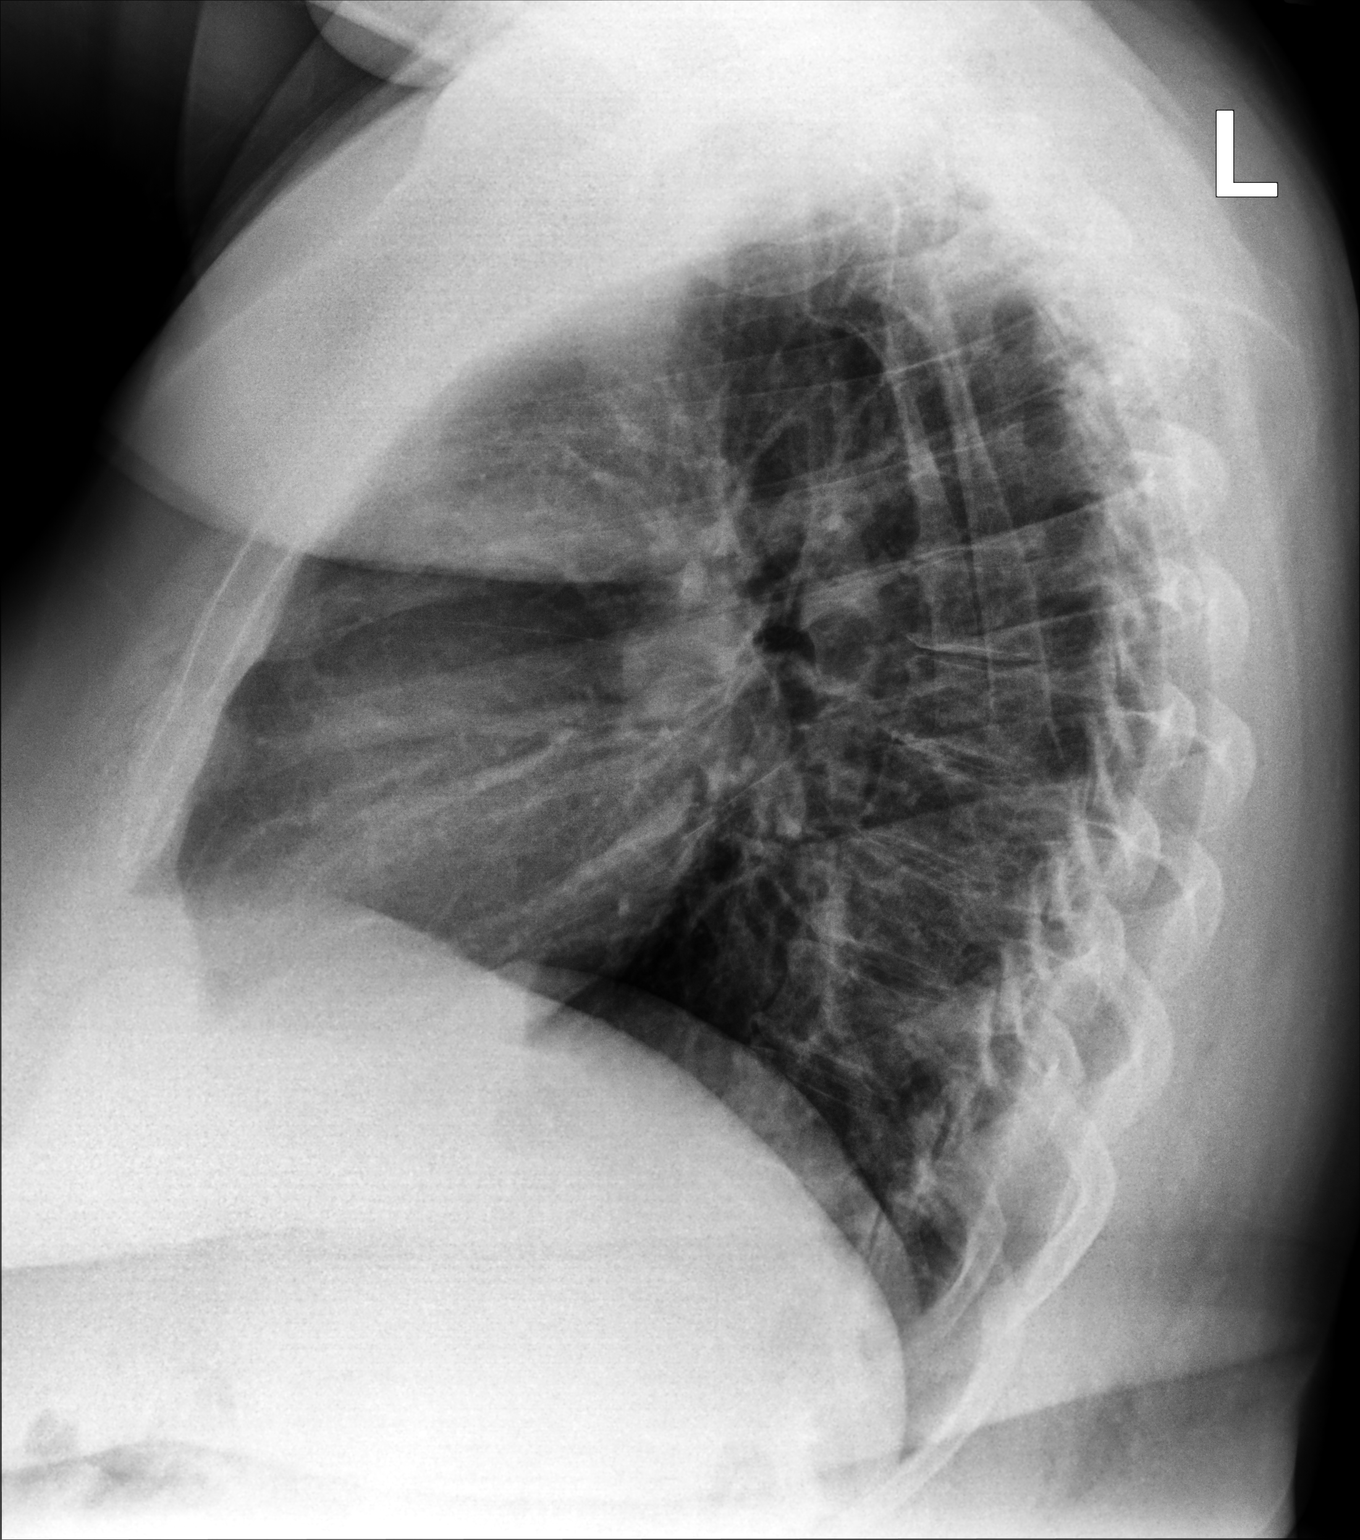

[2 of 2 positions shown; findings below may reference images not displayed]

FINDINGS: The cardiomediastinal silhouette is within normal limits. No pleural
effusion. No pneumothorax. No mass or consolidation. No acute
osseous abnormality.
IMPRESSION: No acute findings in the chest.  No consolidative pneumonia.

## 2023-06-14 ENCOUNTER — Encounter (HOSPITAL_COMMUNITY): Payer: Self-pay

## 2023-06-14 ENCOUNTER — Emergency Department (HOSPITAL_COMMUNITY)
Admission: EM | Admit: 2023-06-14 | Discharge: 2023-06-15 | Disposition: A | Discharge status: Home / Self Care | Payer: BC Managed Care – PPO | Attending: Emergency Medicine | Admitting: Emergency Medicine

## 2023-06-14 ENCOUNTER — Other Ambulatory Visit: Payer: Self-pay

## 2023-06-14 DIAGNOSIS — R42 Dizziness and giddiness: Secondary | ICD-10-CM

## 2023-06-14 DIAGNOSIS — E876 Hypokalemia: Secondary | ICD-10-CM

## 2023-06-14 LAB — CBC WITH DIFFERENTIAL/PLATELET
Abs Immature Granulocytes: 0.02 10*3/uL (ref 0.00–0.07)
Basophils Absolute: 0.1 10*3/uL (ref 0.0–0.1)
Basophils Relative: 1 %
Eosinophils Absolute: 0 10*3/uL (ref 0.0–0.5)
Eosinophils Relative: 0 %
HCT: 39.2 % (ref 36.0–46.0)
Hemoglobin: 13.1 g/dL (ref 12.0–15.0)
Immature Granulocytes: 0 %
Lymphocytes Relative: 37 %
Lymphs Abs: 3.3 10*3/uL (ref 0.7–4.0)
MCH: 29.2 pg (ref 26.0–34.0)
MCHC: 33.4 g/dL (ref 30.0–36.0)
MCV: 87.3 fL (ref 80.0–100.0)
Monocytes Absolute: 0.7 10*3/uL (ref 0.1–1.0)
Monocytes Relative: 7 %
Neutro Abs: 4.8 10*3/uL (ref 1.7–7.7)
Neutrophils Relative %: 55 %
Platelets: 282 10*3/uL (ref 150–400)
RBC: 4.49 MIL/uL (ref 3.87–5.11)
RDW: 12.9 % (ref 11.5–15.5)
WBC: 8.9 10*3/uL (ref 4.0–10.5)
nRBC: 0 % (ref 0.0–0.2)

## 2023-06-14 LAB — COMPREHENSIVE METABOLIC PANEL
ALT: 33 U/L (ref 0–44)
AST: 25 U/L (ref 15–41)
Albumin: 4 g/dL (ref 3.5–5.0)
Alkaline Phosphatase: 101 U/L (ref 38–126)
Anion gap: 11 (ref 5–15)
BUN: 18 mg/dL (ref 6–20)
CO2: 20 mmol/L — ABNORMAL LOW (ref 22–32)
Calcium: 10 mg/dL (ref 8.9–10.3)
Chloride: 105 mmol/L (ref 98–111)
Creatinine, Ser: 0.76 mg/dL (ref 0.44–1.00)
GFR, Estimated: >60 mL/min (ref >60)
Glucose, Bld: 107 mg/dL — ABNORMAL HIGH (ref 70–99)
Potassium: 3.2 mmol/L — ABNORMAL LOW (ref 3.5–5.1)
Sodium: 136 mmol/L (ref 135–145)
Total Bilirubin: 0.7 mg/dL (ref 0.3–1.2)
Total Protein: 7.2 g/dL (ref 6.5–8.1)

## 2023-06-14 LAB — MAGNESIUM: Magnesium: 2.1 mg/dL (ref 1.7–2.4)

## 2023-06-14 NOTE — ED Triage Notes (Signed)
Dizzy since Thursday  SOB started today   Had gastric sleeve 04/25/21 Has been having issues since Needed hernia surgery  Seen MD Wednesday 10/16, pt was admitted at that time at [facility], discharged 10/19 told to stay on full liquid diet   Has been lightheaded and dizzy since Thursday

## 2023-06-15 ENCOUNTER — Emergency Department (HOSPITAL_COMMUNITY): Payer: BC Managed Care – PPO

## 2023-06-15 LAB — LACTIC ACID, PLASMA: Lactic Acid, Venous: 1.1 mmol/L (ref 0.5–1.9)

## 2023-06-15 LAB — D-DIMER, QUANTITATIVE: D-Dimer, Quant: 3.16 ug{FEU}/mL — ABNORMAL HIGH (ref 0.00–0.50)

## 2023-06-15 MED ORDER — ONDANSETRON 4 MG PO TBDP: ORAL_TABLET | ORAL | 0 refills | Status: DC

## 2023-06-15 MED ORDER — SODIUM CHLORIDE 0.9 % IV BOLUS
1000.0000 mL | Freq: Once | INTRAVENOUS | Status: AC
  Administered 2023-06-15: 1000 mL via INTRAVENOUS

## 2023-06-15 MED ORDER — ONDANSETRON HCL 4 MG/2ML IJ SOLN
4.0000 mg | Freq: Once | INTRAMUSCULAR | Status: DC
  Filled 2023-06-15: qty 2

## 2023-06-15 MED ORDER — MECLIZINE HCL 12.5 MG PO TABS
25.0000 mg | ORAL_TABLET | Freq: Once | ORAL | Status: AC
  Administered 2023-06-15: 25 mg via ORAL
  Filled 2023-06-15: qty 2

## 2023-06-15 MED ORDER — IOHEXOL 350 MG/ML SOLN
75.0000 mL | Freq: Once | INTRAVENOUS | Status: AC | PRN
Start: 2023-06-15 — End: 2023-06-15
  Administered 2023-06-15: 75 mL via INTRAVENOUS

## 2023-06-15 MED ORDER — SODIUM CHLORIDE 0.9 % IV BOLUS: 30.0000 mL/kg | Freq: Once | INTRAVENOUS | Status: DC

## 2023-06-15 MED ORDER — POTASSIUM CHLORIDE CRYS ER 20 MEQ PO TBCR
40.0000 meq | EXTENDED_RELEASE_TABLET | Freq: Once | ORAL | Status: AC
  Administered 2023-06-15: 40 meq via ORAL
  Filled 2023-06-15: qty 2

## 2023-06-15 MED ORDER — MECLIZINE HCL 25 MG PO TABS: 25.0000 mg | ORAL_TABLET | Freq: Three times a day (TID) | ORAL | 0 refills | Status: AC | PRN

## 2023-06-15 NOTE — ED Provider Notes (Signed)
[facility] AT [facility] Provider Note   CSN: [id number] Arrival date & time: 06/14/23  2050     History  Chief Complaint  Patient presents with   Dizziness    Melinda Fox is a 47 y.o. female.  The history is provided by the patient.  Dizziness She has history of GERD, Raynaud's phenomenon and is 3 weeks status post hiatal hernia repair comes in with her major complaint of dizziness with vomiting.  She has been having dizziness for the last 5 days.  It is not affected by body position or movement.  She has been able to hold small amounts of fluids down.  She also noted earlier today that she was very short of breath.  She denies any chest pain.   Home Medications Prior to Admission medications   Medication Sig Start Date End Date Taking? Authorizing Provider  Biotin 10 MG CAPS Take by mouth.    [provider]  butalbital-acetaminophen-caffeine (FIORICET) 50-325-40 MG tablet Take 1 tablet by mouth every 8 (eight) hours as needed. 06/04/22   [provider]  famotidine (PEPCID) 20 MG tablet Take 20 mg by mouth 2 (two) times daily. 06/04/22   [provider]  ferrous sulfate 325 (65 FE) MG tablet Take by mouth.    [provider]  gabapentin (NEURONTIN) 100 MG capsule TAKE 3 CAPSULES(300 MG) BY MOUTH AT BEDTIME 05/11/23   Federico Flake, MD  hydrOXYzine (VISTARIL) 25 MG capsule Take 1 capsule (25 mg total) by mouth every 8 (eight) hours as needed for anxiety. 08/26/20   Danelle Berry, [name]  hydrOXYzine (VISTARIL) 25 MG capsule Take 1 capsule (25 mg total) by mouth every 8 (eight) hours as needed. 02/04/22   Danelle Berry, [name]  hyoscyamine (LEVSIN SL) 0.125 MG SL tablet SMARTSIG:2 Sublingual Twice Daily 06/04/22   [provider]  indomethacin (INDOCIN) 50 MG capsule Take 1 capsule (50 mg total) by mouth 3 (three) times daily as needed. 08/25/22   Berniece Salines, FNP  levothyroxine (SYNTHROID) 50 MCG  tablet TAKE 1 TABLET(50 MCG) BY MOUTH DAILY BEFORE BREAKFAST 03/08/23   Danelle Berry, [name]  medroxyPROGESTERone (DEPO-PROVERA) 150 MG/ML injection Inject 1 mL (150 mg total) into the muscle every 3 (three) months. 04/05/23   Federico Flake, MD  methylPREDNISolone (MEDROL DOSEPAK) 4 MG TBPK tablet Please follow dosing instructions on dose pack. 01/26/23   Mecum, Erin E, [name]  metoCLOPramide (REGLAN) 5 MG tablet Take 5 mg by mouth 3 (three) times daily. 06/04/22   [provider]  misoprostol (CYTOTEC) 200 MCG tablet Take 200 mcg by mouth 4 (four) times daily. 05/21/22   [provider]  Multiple Vitamin (MULTI-VITAMIN) tablet Take 1 tablet by mouth daily.    [provider]  Multiple Vitamins-Minerals (ONE DAILY CALCIUM/IRON) TABS Take by mouth. 06/04/22   [provider]  ondansetron (ZOFRAN) 4 MG tablet Take 4 mg by mouth every 8 (eight) hours as needed. 06/04/22   [provider]  pantoprazole (PROTONIX) 40 MG tablet Take 40 mg by mouth 2 (two) times daily. 06/04/22   [provider]  phentermine (ADIPEX-P) 37.5 MG tablet Take by mouth. 12/26/21   [provider]  promethazine (PHENERGAN) 12.5 MG tablet Take by mouth. 06/04/22   [provider]  sucralfate (CARAFATE) 1 g tablet Take 1 g by mouth 4 (four) times daily. 05/21/22   [provider]  traZODone (DESYREL) 50 MG tablet Take 0.5-1 tablets (25-50 mg total)  by mouth at bedtime as needed for sleep. 04/08/23   Alba Cory, MD  triamcinolone ointment (KENALOG) 0.5 % Apply 1 Application topically 2 (two) times daily. 01/26/23   Mecum, Erin E, [name]  venlafaxine XR (EFFEXOR-XR) 37.5 MG 24 hr capsule TAKE 1 CAPSULE BY MOUTH EVERY MORNING WITH 75 MG CAPSULE FOR TOTAL OF 112.5 MG DAILY 12/06/22   Danelle Berry, [name]  venlafaxine XR (EFFEXOR-XR) 75 MG 24 hr capsule TAKE 1 CAPSULE BY MOUTH EVERY MORNING WITH 37.5 MG CAPSULE FOR TOTAL OF 112.5 MG DAILY 12/06/22   Danelle Berry, [name]      Allergies    Sulfa antibiotics, Nsaids, Spearmint flavor, and Oxycodone    Review of Systems   Review of Systems  Neurological:  Positive for dizziness.  All other systems reviewed and are negative.   Physical Exam Updated Vital Signs BP 120/73   Pulse 80   Temp 98.3 F (36.8 C) (Oral)   Resp 15   Ht 5\' 8"  (1.727 m)   Wt 100.2 kg   SpO2 98%   BMI 33.60 kg/m  Physical Exam Vitals and nursing note reviewed.   47 year old female, resting comfortably and in no acute distress. Vital signs are normal. Oxygen saturation is 98%, which is normal. Head is normocephalic and atraumatic. PERRLA, EOMI. Oropharynx is clear.  There is no nystagmus, but dizziness is reproduced by testing extraocular movements.  Dizziness is also partly reproduced by passive head movement. Neck is nontender and supple without adenopathy. Lungs are clear without rales, wheezes, or rhonchi. Chest is nontender. Heart has regular rate and rhythm without murmur. Abdomen is soft, flat, nontender. Extremities have no cyanosis or edema, full range of motion is present. Skin is warm and dry without rash. Neurologic: Mental status is normal, cranial nerves are intact, moves all extremities equally.  ED Results / Procedures / Treatments   Labs (all labs ordered are listed, but only abnormal results are displayed) Labs Reviewed  COMPREHENSIVE METABOLIC PANEL - Abnormal; Notable for the following components:      Result Value   Potassium 3.2 (*)    CO2 20 (*)    Glucose, Bld 107 (*)    All other components within normal limits  D-DIMER, QUANTITATIVE - Abnormal; Notable for the following components:   D-Dimer, Quant 3.16 (*)    All other components within normal limits  CBC WITH DIFFERENTIAL/PLATELET  MAGNESIUM  LACTIC ACID, PLASMA   Radiology No results found.  Procedures Procedures    Medications Ordered in ED Medications  ondansetron (ZOFRAN) injection 4 mg (4 mg Intravenous  Patient Refused/Not Given 06/15/23 0238)  meclizine (ANTIVERT) tablet 25 mg (25 mg Oral Given 06/15/23 0238)  potassium chloride SA (KLOR-CON M) CR tablet 40 mEq (40 mEq Oral Given 06/15/23 0238)  sodium chloride 0.9 % bolus 1,000 mL (0 mLs Intravenous Stopped 06/15/23 0658)  iohexol (OMNIPAQUE) 350 MG/ML injection 75 mL (75 mLs Intravenous Contrast Given 06/15/23 0325)    ED Course/ Medical Decision Making/ A&P                                 Medical Decision Making Amount and/or Complexity of Data Reviewed Labs: ordered. Radiology: ordered.  Risk Prescription drug management.   Dizziness which seems more likely to be vertigo than hypovolemia.  Episode of dyspnea, concerning for possible pulmonary embolism in the setting of recent surgery.  I have ordered orthostatic vital sign  testing to evaluate for possible hypovolemia.  I have reviewed her initial laboratory tests, my interpretation is mild hypokalemia, mild elevation of random glucose level, normal CBC.  I have ordered a D-dimer to rule out pulmonary embolism.  I have ordered IV fluids, oral meclizine, IV ondansetron.  I reviewed her past records and noted extensive evaluations at [facility] where she had laparoscopic hiatal hernia repair on 05/26/2023, also had upper endoscopy because of dysphagia and had esophageal stretching.  Patient noted no improvement with above-noted treatment.  D-dimer has come back significantly elevated and I ordered a CT angiogram of the chest which showed no evidence of pulmonary embolism or other acute process.  Have independently viewed the images, and agree with the radiologist's interpretation.  Mucosa failure to respond to meclizine, I have ordered an MRI scan to rule out central vertigo.  I am signing the case out to Dr. Estell Harpin.  Final Clinical Impression(s) / ED Diagnoses Final diagnoses:  Dizziness  Hypokalemia    Rx / DC Orders ED Discharge Orders     None         Dione Booze,  MD 06/15/23 [phone]

## 2023-06-15 NOTE — Discharge Instructions (Signed)
Follow up with your md next week for recheck if not improving or you can follow up with the ENT doctor,  dr. Suszanne Conners

## 2023-06-17 ENCOUNTER — Ambulatory Visit: Payer: BC Managed Care – PPO | Admitting: Family Medicine

## 2023-06-22 ENCOUNTER — Other Ambulatory Visit: Payer: Self-pay | Admitting: Family Medicine

## 2023-06-22 DIAGNOSIS — F321 Major depressive disorder, single episode, moderate: Secondary | ICD-10-CM

## 2023-06-26 IMAGING — CT CT ABD-PELV W/ CM
2 of 6 series · 14 of 46 positions shown, 16 images · IV contrast (Omnipaque or Isovue)
Comparison: None.

CLINICAL DATA: Evaluate for pancreatitis. Complains of pressure and
epigastric region resulting in shortness of breath.

EXAM:
CT ABDOMEN AND PELVIS WITH CONTRAST
TECHNIQUE: Multidetector CT imaging of the abdomen and pelvis was performed
using the standard protocol following bolus administration of
intravenous contrast.

[Series 2: axial st · axial · 0.82mm/px · z∈[+805,+1255]mm · 11 of 108 slices shown, 13 images]
[im 9/108  soft-tissue]
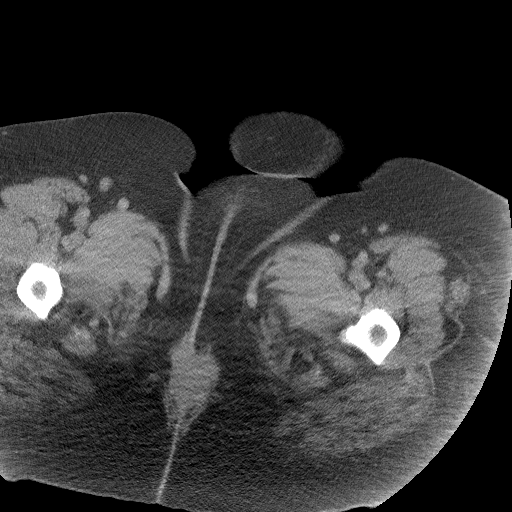
[im 9/108  bone]
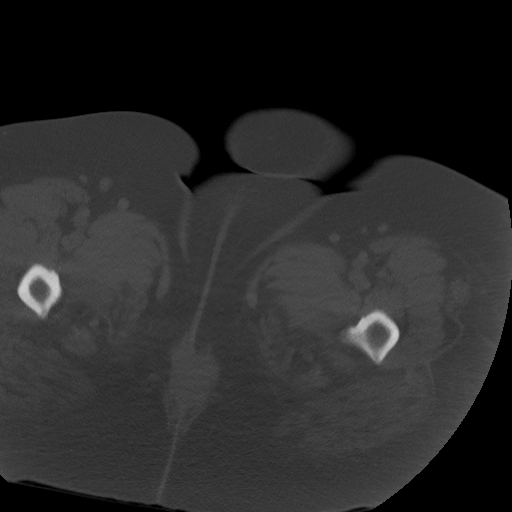
[im 18/108  soft-tissue]
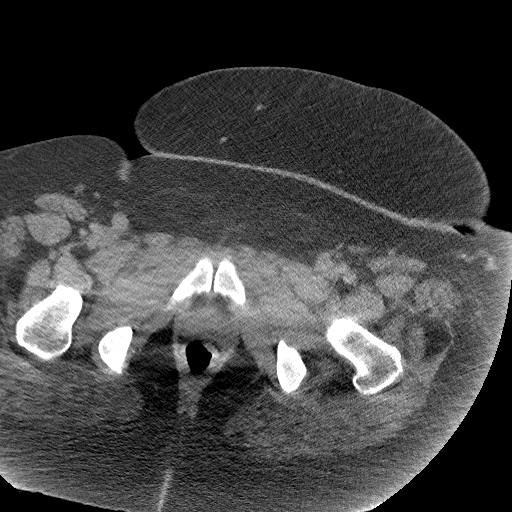
[im 27/108  soft-tissue]
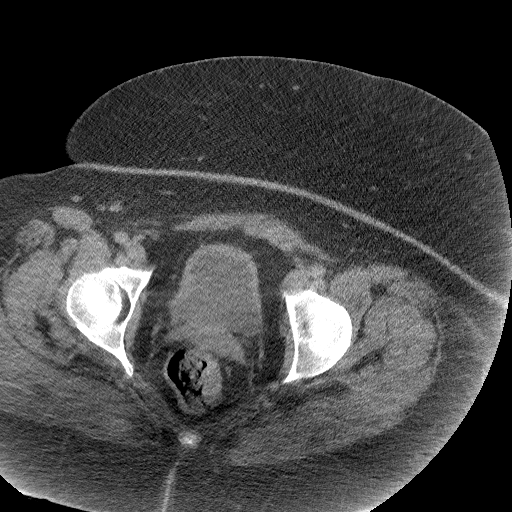
[im 36/108  soft-tissue]
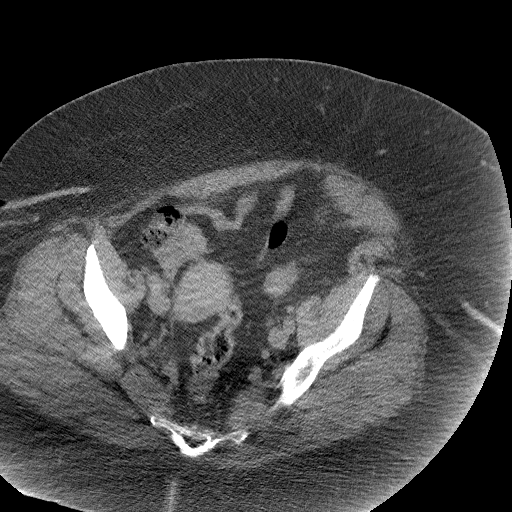
[im 45/108  soft-tissue]
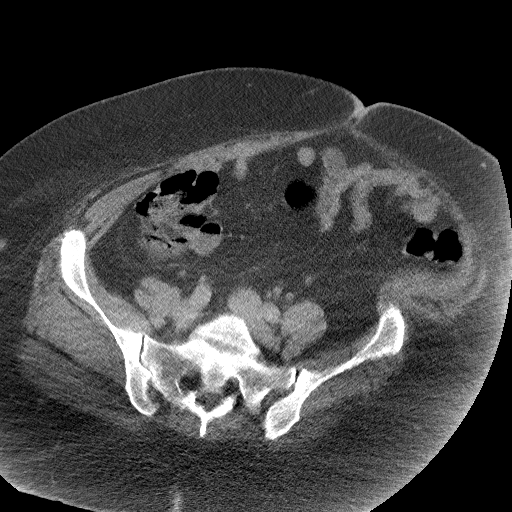
[im 54/108  soft-tissue]
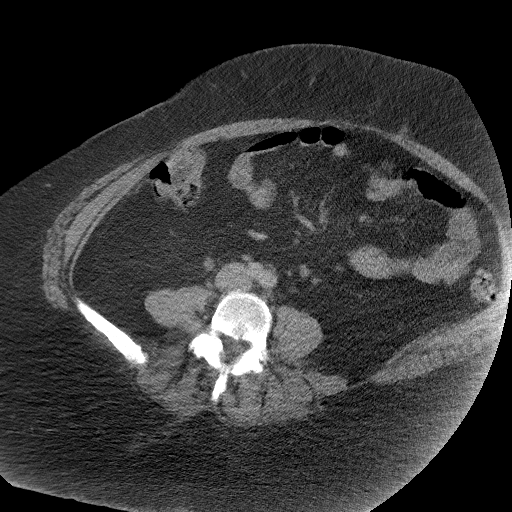
[im 63/108  soft-tissue]
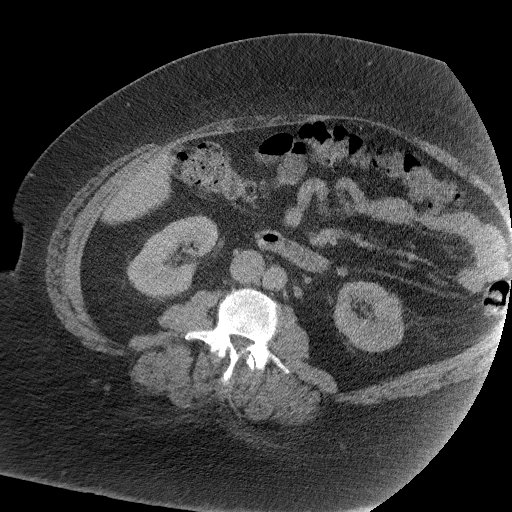
[im 72/108  soft-tissue]
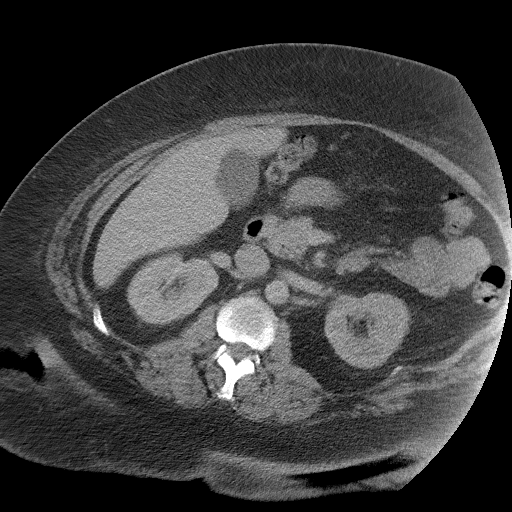
[im 81/108  soft-tissue]
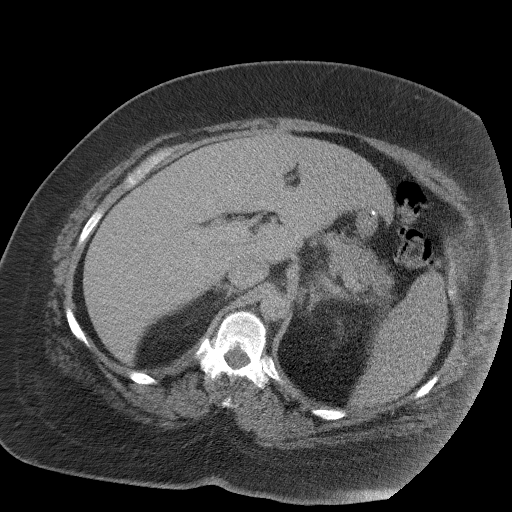
[im 81/108  bone]
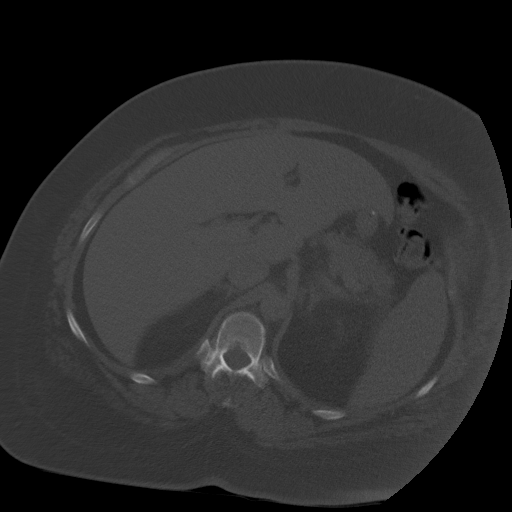
[im 90/108  soft-tissue]
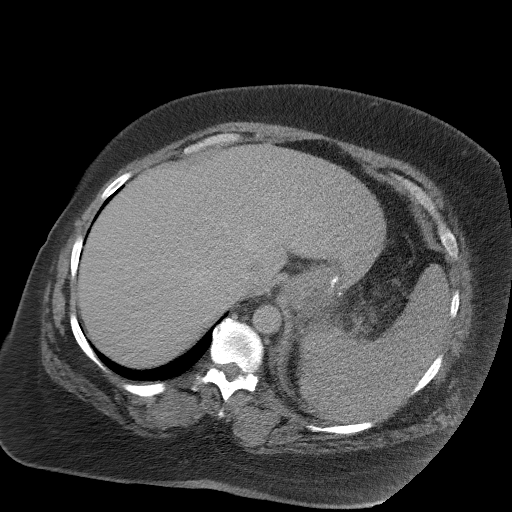
[im 99/108  soft-tissue]
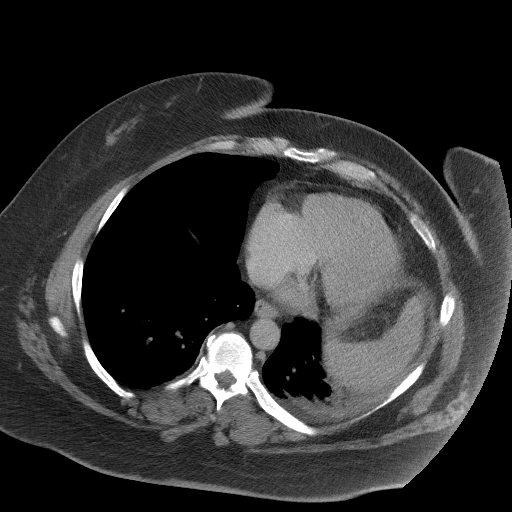

[Series 5: coronal st · coronal · 1.05mm/px · 3 of 149 slices shown]
[im 50/149  soft-tissue]
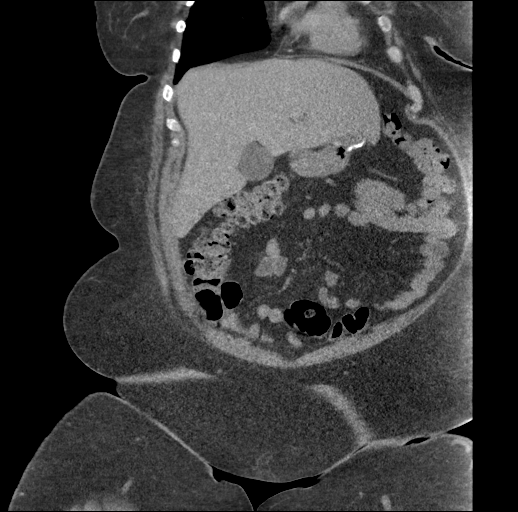
[im 66/149  soft-tissue]
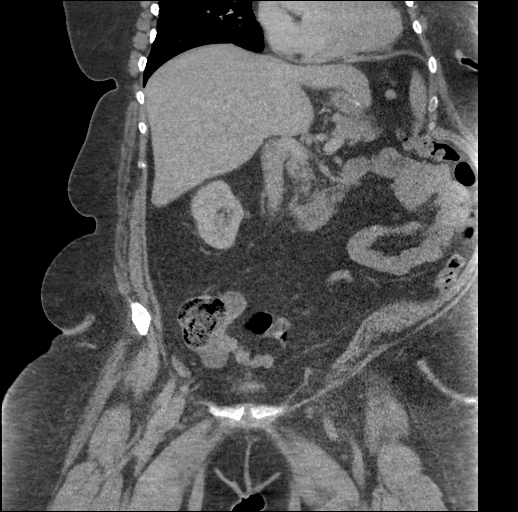
[im 83/149  soft-tissue]
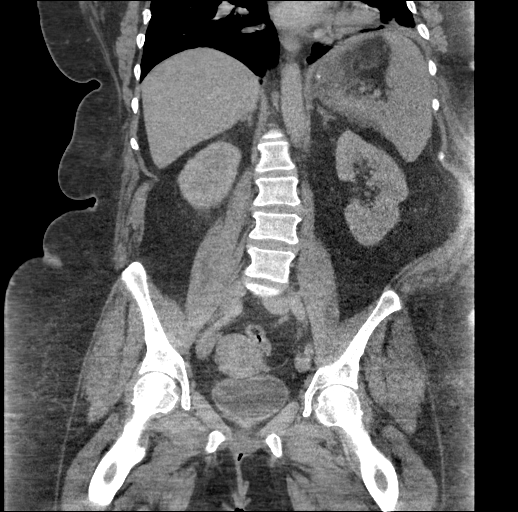

[14 of 46 positions shown; findings below may reference images not displayed]

RADIATION DOSE REDUCTION: This exam was performed according to the
departmental dose-optimization program which includes automated
exposure control, adjustment of the mA and/or kV according to
patient size and/or use of iterative reconstruction technique.

CONTRAST:  75mL OMNIPAQUE IOHEXOL 300 MG/ML  SOLN
FINDINGS: Lower chest: There is a small left pleural effusion. Overlying
atelectasis identified.

Hepatobiliary: No focal liver abnormality is seen. No gallstones,
gallbladder wall thickening, or biliary dilatation.

Pancreas: Unremarkable. No pancreatic ductal dilatation or
surrounding inflammatory changes.

Spleen: The spleen is enlarged measuring 13.2 cm in length.

Adrenals/Urinary Tract: Normal adrenal glands. No mass,
nephrolithiasis or hydronephrosis identified. No hydroureter or
ureteral lithiasis. Urinary bladder is unremarkable.

Stomach/Bowel: Postoperative changes from previous left previous
sleeve gastrectomy identified, image [DATE] through image 34/2.
Adjacent to the anastomotic suture chain, just below the GE junction
and along the undersurface of the left hemidiaphragm there is a
focal fluid collection with surrounding soft tissue stranding
measuring 3.7 x 4.6 by 3.6 cm (volume = 32 cm^3). This extends up
to the anteromedial surface of the spleen. There is soft tissue
stranding which extends up to the left adrenal gland, image [DATE]. No
pathologic dilatation of the large or small bowel loops. No bowel
wall thickening, inflammation, or distension.

Vascular/Lymphatic: No significant vascular findings are present. No
enlarged abdominal or pelvic lymph nodes.

Reproductive: Uterus and bilateral adnexa are unremarkable.

Other: No free fluid or fluid collections identified within the
remaining portions of the abdomen or pelvis. No signs of
pneumoperitoneum.

Musculoskeletal: No acute or significant osseous findings.
IMPRESSION: 1. Postoperative changes from previous sleeve gastrectomy
identified.
2. There is a focal fluid collection within the left upper quadrant
of the abdomen along the gastric suture chain which measures
approximately 32 cc. There is surrounding soft tissue stranding.
Without prior imaging for comparison this is indeterminate. However,
in a patient presenting with acute epigastric pain findings are
suspicious for anastomotic dehiscence and/or abscess formation.
Recommend further evaluation with repeat CT of the abdomen following
ingestion of oral contrast material to assess for extraluminal
contrast extravasation.
3. Splenomegaly.
4. Small left pleural effusion with overlying atelectasis, this may
be reactive secondary to left upper quadrant inflammatory change.

## 2023-06-26 IMAGING — CT CT ABDOMEN W/O CM
2 of 4 series · 14 of 46 positions shown, 16 images · non-contrast
Comparison: CT AP from earlier today.

CLINICAL DATA: Evaluate for suture line dehiscence status post
sleeve gastrectomy.

EXAM:
CT ABDOMEN WITHOUT CONTRAST
TECHNIQUE: Multidetector CT imaging of the abdomen was performed following the
standard protocol without IV contrast.
RADIATION DOSE REDUCTION: This exam was performed according to the
departmental dose-optimization program which includes automated
exposure control, adjustment of the mA and/or kV according to
patient size and/or use of iterative reconstruction technique.

[Series 2: axial st · axial · 0.98mm/px · z∈[+1090,+1330]mm · 11 of 58 slices shown, 13 images]
[im 5/58  soft-tissue]
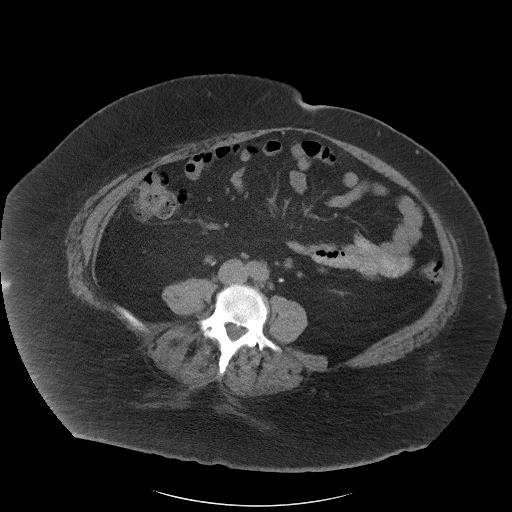
[im 5/58  bone]
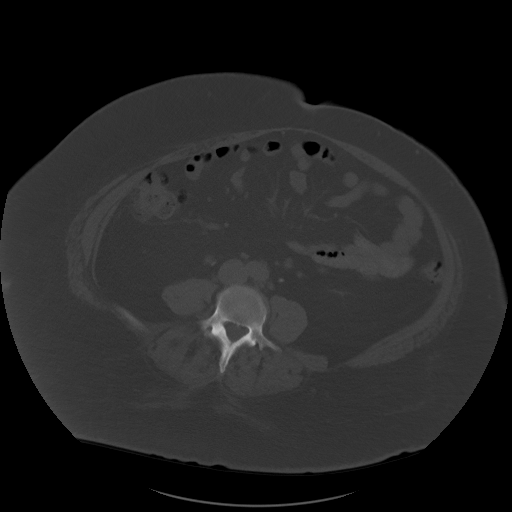
[im 10/58  soft-tissue]
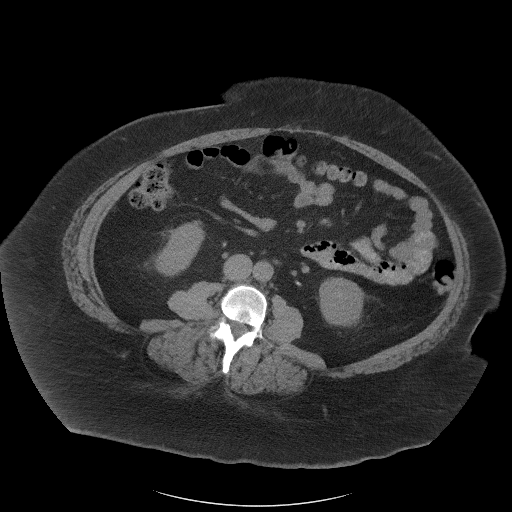
[im 15/58  soft-tissue]
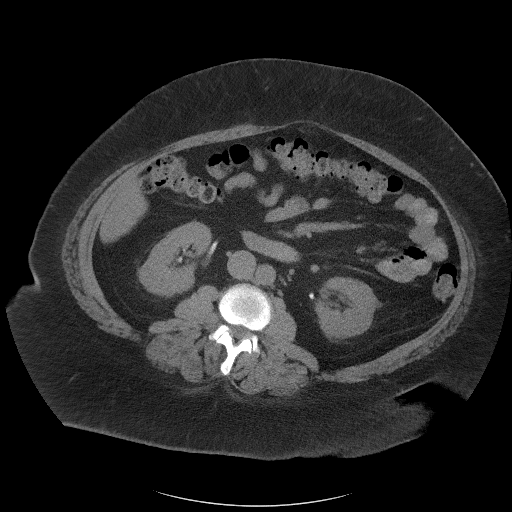
[im 20/58  soft-tissue]
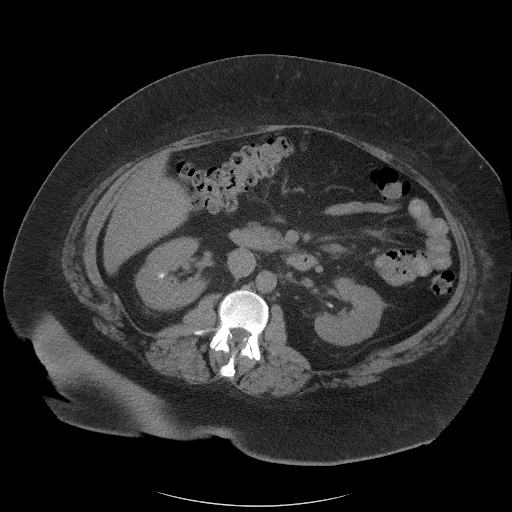
[im 24/58  soft-tissue]
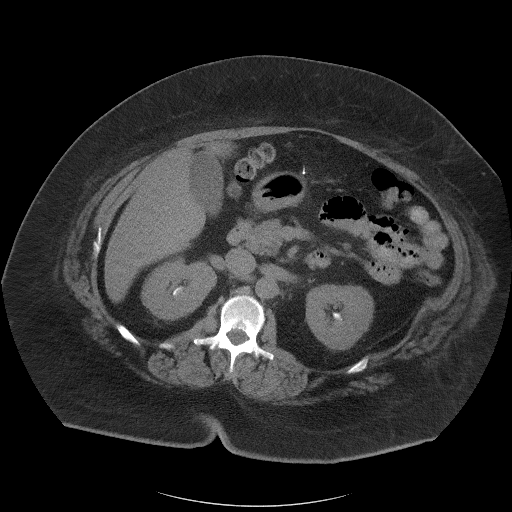
[im 29/58  soft-tissue]
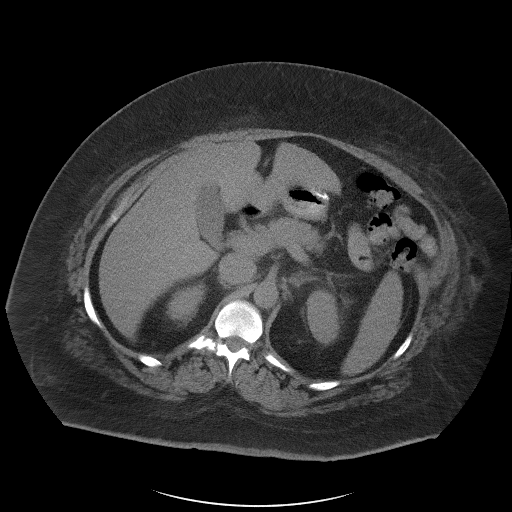
[im 34/58  soft-tissue]
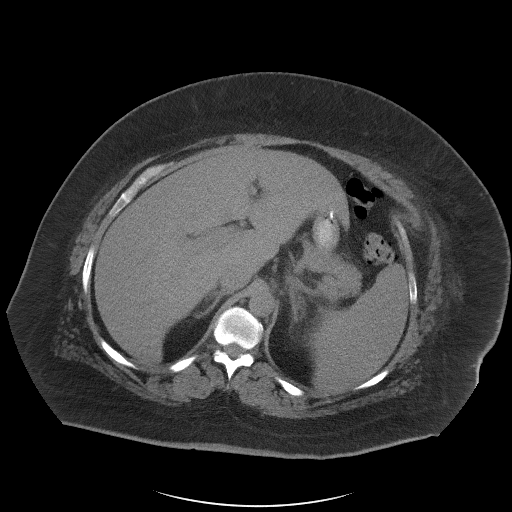
[im 39/58  soft-tissue]
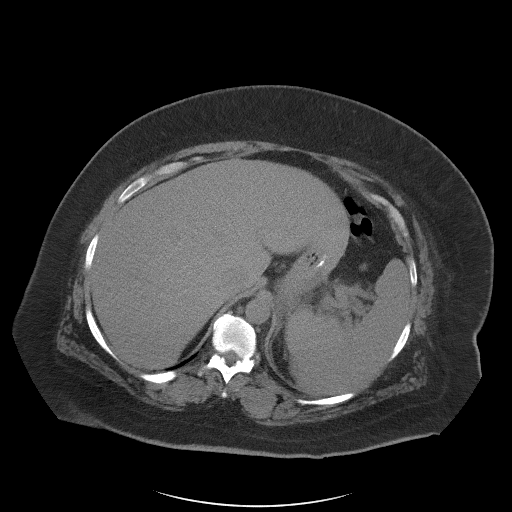
[im 43/58  soft-tissue]
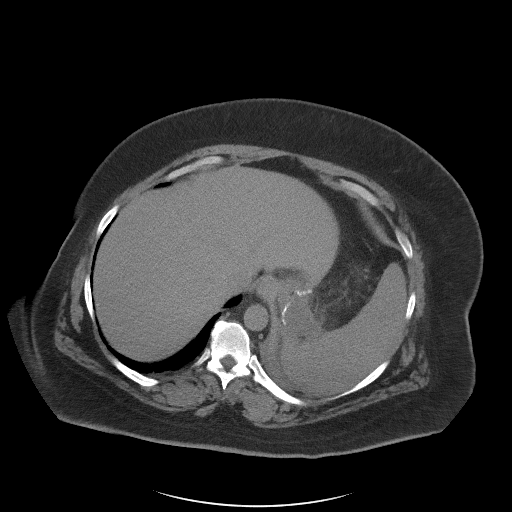
[im 43/58  bone]
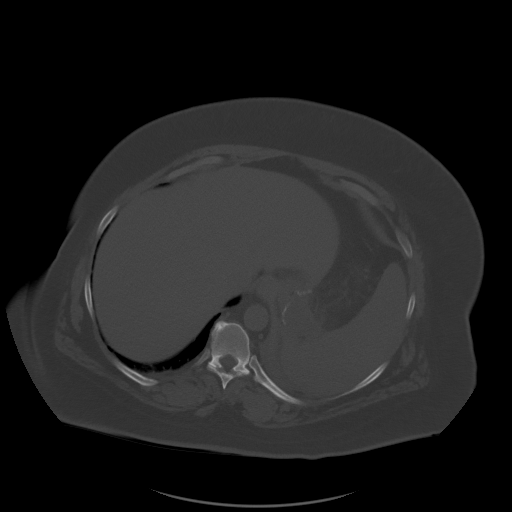
[im 48/58  soft-tissue]
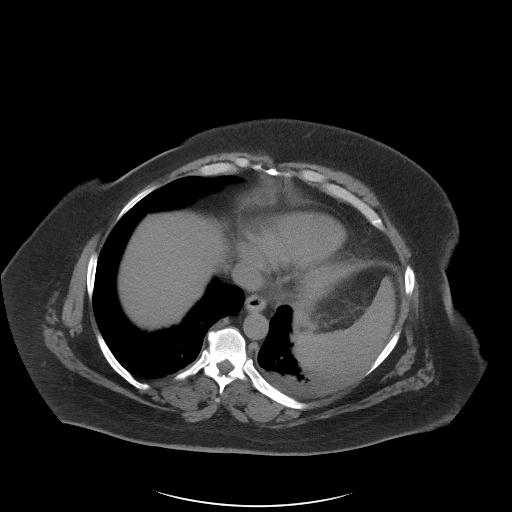
[im 53/58  soft-tissue]
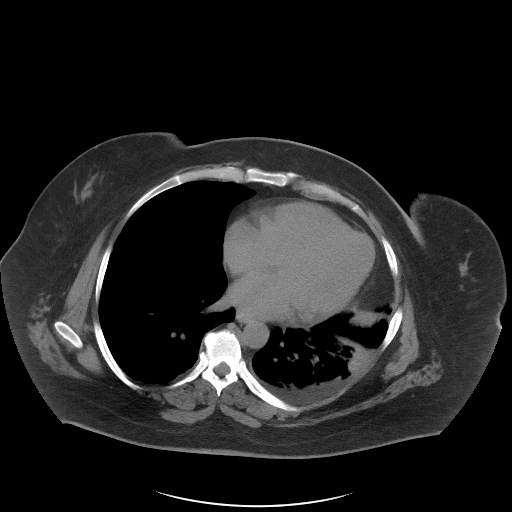

[Series 5: coronal st · coronal · 0.62mm/px · 3 of 117 slices shown]
[im 39/117  soft-tissue]
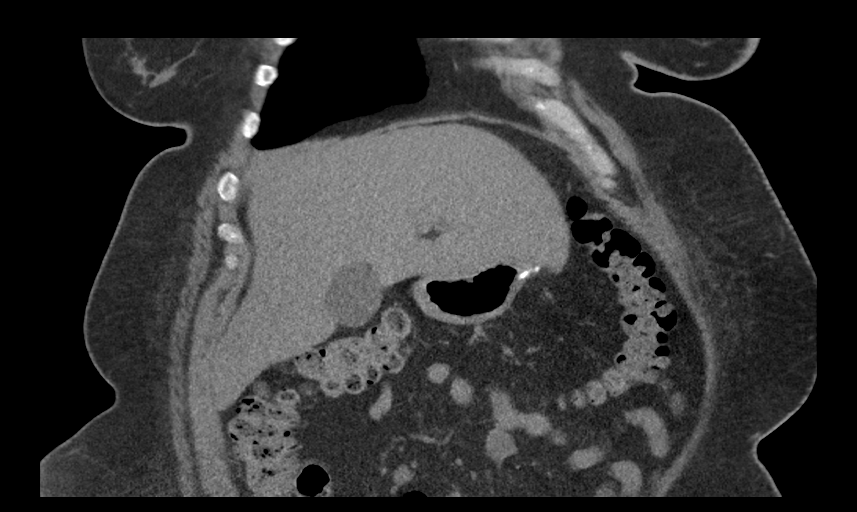
[im 52/117  soft-tissue]
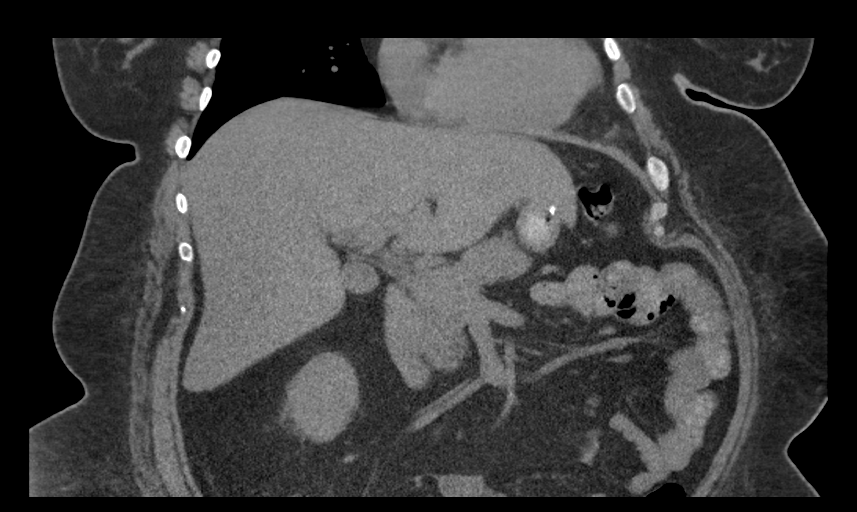
[im 65/117  soft-tissue]
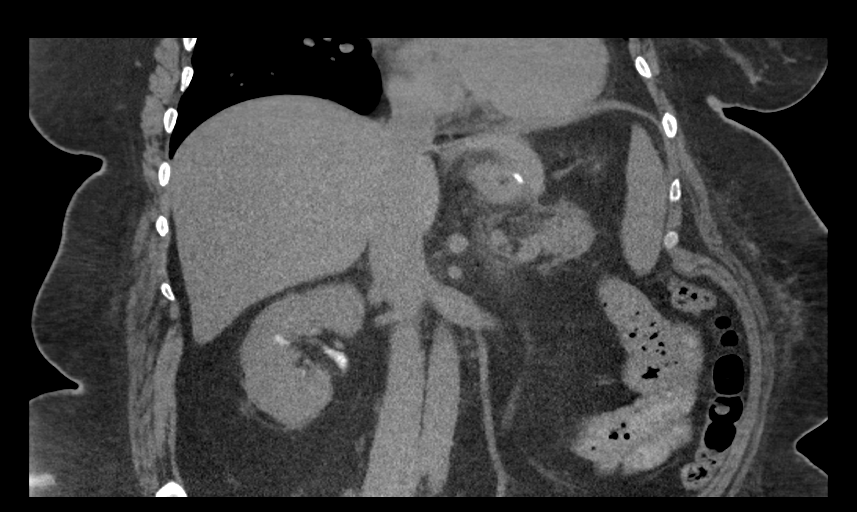

[14 of 46 positions shown; findings below may reference images not displayed]

FINDINGS: Lower chest: Again seen is a left pleural effusion with overlying
soft tissue atelectasis.

Hepatobiliary: No focal liver abnormality is seen. No gallstones,
gallbladder wall thickening, or biliary dilatation.

Pancreas: No main duct dilatation, inflammation or mass.

Spleen: Mild splenomegaly.

Adrenals/Urinary Tract: Normal adrenal glands. No kidney mass or
hydronephrosis identified.

Stomach/Bowel: Postoperative changes from sleeve gastrectomy are
again noted. Adjacent to the suture line, just below the GE junction
there is a focal fluid density structure measuring 4.0 x 2.7 by
cm. There is surrounding inflammatory fat stranding identified which
extends into the left adrenal bed and up to the anteromedial margin
of the spleen. No frank extravasation of oral contrast material
identified from the suture line. No dilated loops of large or small
bowel.

Vascular/Lymphatic: No significant vascular findings are present. No
enlarged abdominal or pelvic lymph nodes.

Other: None

Musculoskeletal: No acute or significant osseous findings.
IMPRESSION: 1. Postoperative changes from sleeve gastrectomy are again noted.
2. Adjacent to the suture line, just below the GE junction there is
a focal fluid collection with surrounding inflammatory fat stranding
measuring 4.0 x 2.7 x 2.8 cm. No frank extravasation of oral
contrast material identified at this time. Findings remain worrisome
for inflammatory/infectious process perhaps secondary to suture line
dehiscence. Surgical consultation is recommended.
3. Persistent left pleural effusion with overlying soft tissue
atelectasis.

## 2023-07-01 ENCOUNTER — Ambulatory Visit: Payer: BC Managed Care – PPO | Admitting: Family Medicine

## 2023-07-04 ENCOUNTER — Ambulatory Visit: Payer: BC Managed Care – PPO

## 2023-07-04 VITALS — BP 104/66 | HR 83

## 2023-07-04 DIAGNOSIS — Z3042 Encounter for surveillance of injectable contraceptive: Secondary | ICD-10-CM | POA: Diagnosis not present

## 2023-07-04 MED ORDER — MEDROXYPROGESTERONE ACETATE 150 MG/ML IM SUSY
150.0000 mg | PREFILLED_SYRINGE | Freq: Once | INTRAMUSCULAR | Status: AC
  Administered 2023-07-04: 150 mg via INTRAMUSCULAR

## 2023-07-04 NOTE — Progress Notes (Signed)
Date last pap: 03/11/23. Last Depo-Provera: 04/05/23. Side Effects if any: None. Serum HCG indicated? N/A. Depo-Provera 150 mg IM given by: Corky Crafts CMA . Next appointment due . 02/3-02/17

## 2023-07-07 ENCOUNTER — Ambulatory Visit (HOSPITAL_COMMUNITY)
Admission: RE | Admit: 2023-07-07 | Discharge: 2023-07-07 | Disposition: A | Discharge status: Home / Self Care | Payer: BC Managed Care – PPO | Source: Ambulatory Visit | Attending: Family Medicine | Admitting: Family Medicine

## 2023-07-07 DIAGNOSIS — Z1231 Encounter for screening mammogram for malignant neoplasm of breast: Secondary | ICD-10-CM | POA: Insufficient documentation

## 2023-07-07 DIAGNOSIS — Z01419 Encounter for gynecological examination (general) (routine) without abnormal findings: Secondary | ICD-10-CM

## 2023-07-11 ENCOUNTER — Ambulatory Visit (HOSPITAL_COMMUNITY)
Admission: RE | Admit: 2023-07-11 | Discharge: 2023-07-11 | Disposition: A | Discharge status: Home / Self Care | Payer: BC Managed Care – PPO | Source: Ambulatory Visit | Attending: General Surgery | Admitting: General Surgery

## 2023-07-11 ENCOUNTER — Other Ambulatory Visit (HOSPITAL_COMMUNITY): Payer: Self-pay | Admitting: Family Medicine

## 2023-07-11 ENCOUNTER — Other Ambulatory Visit (HOSPITAL_COMMUNITY): Payer: Self-pay | Admitting: General Surgery

## 2023-07-11 DIAGNOSIS — K5900 Constipation, unspecified: Secondary | ICD-10-CM | POA: Insufficient documentation

## 2023-07-11 DIAGNOSIS — R928 Other abnormal and inconclusive findings on diagnostic imaging of breast: Secondary | ICD-10-CM

## 2023-07-17 ENCOUNTER — Encounter: Payer: Self-pay | Admitting: Emergency Medicine

## 2023-07-17 ENCOUNTER — Ambulatory Visit
Admission: EM | Admit: 2023-07-17 | Discharge: 2023-07-17 | Disposition: A | Discharge status: Home / Self Care | Payer: BC Managed Care – PPO | Attending: Family Medicine | Admitting: Family Medicine

## 2023-07-17 ENCOUNTER — Other Ambulatory Visit: Payer: Self-pay

## 2023-07-17 DIAGNOSIS — J069 Acute upper respiratory infection, unspecified: Secondary | ICD-10-CM | POA: Diagnosis not present

## 2023-07-17 LAB — POC COVID19/FLU A&B COMBO
Covid Antigen, POC: NEGATIVE
Influenza A Antigen, POC: NEGATIVE
Influenza B Antigen, POC: NEGATIVE

## 2023-07-17 MED ORDER — PROMETHAZINE-DM 6.25-15 MG/5ML PO SYRP: 5.0000 mL | ORAL_SOLUTION | Freq: Four times a day (QID) | ORAL | 0 refills | Status: DC | PRN

## 2023-07-17 NOTE — ED Triage Notes (Addendum)
Pt reports runny nose, fatigue, chest congestion since this am.   Pt also reports frequent hospitalization recently and reports continued symptoms "of failure to thrive, emesis, dizziness". Denies at present but reports "shared because it may be related."

## 2023-07-17 NOTE — ED Provider Notes (Signed)
[facility]    CSN: [id number] Arrival date & time: 07/17/23  1347      History   Chief Complaint Chief Complaint  Patient presents with   Fatigue    HPI Melinda Fox is a 47 y.o. female.   Patient presenting today with 1 day history of chills, runny nose, fatigue, chest congestion, mild cough, scratchy throat.  Denies fever, chest pain, shortness of breath, abdominal pain, nausea vomiting or diarrhea.  So far not tried anything over-the-counter for symptoms.  Child sick with similar symptoms.    Past Medical History:  Diagnosis Date   Allergy    Anemia    Chronic migraine without aura without status migrainosus, not intractable    Depression    Elevated blood-pressure reading without diagnosis of hypertension    Moderate concentric left ventricular hypertrophy 06/01/2017   Echocardiogram October 2018   Morbid obesity with body mass index (BMI) of 60.0 to 69.9 in adult [facility]) 07/01/2015   Scoliosis    Sleep apnea, obstructive    Thyroid disease    Vasculitis (HCC)     Patient Active Problem List   Diagnosis Date Noted   Ganglion cyst of wrist, right 09/13/2022   Muscle strain of right shoulder 09/13/2022   Chronic pain of right wrist 09/13/2022   Pain in joint of right shoulder 09/13/2022   Gastroesophageal reflux disease without esophagitis 06/04/2022   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   Sinus bradycardia by electrocardiogram 04/07/2022   Intra-abdominal fluid collection 10/03/2021   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   S/P biliopancreatic diversion with duodenal switch 06/04/2021   Morbid obesity with BMI of 40.0-44.9, adult (HCC) 05/12/2021   OSA on CPAP 01/05/2021   Abnormal MRI, cervical spine 03/18/2020   Left carpal tunnel syndrome 03/13/2020   Ulnar neuropathy of left upper extremity 03/13/2020   Decreased grip strength of left hand 01/29/2020   Numbness and tingling in left arm 01/29/2020   Simple chronic  bronchitis (HCC) 03/05/2019   Paroxysmal tachycardia (HCC) 10/23/2018   Moderate concentric left ventricular hypertrophy 06/01/2017   Iron deficiency anemia 07/01/2015   Migraine without aura and with status migrainosus, not intractable 07/01/2015   Major depression, recurrent, full remission (HCC) 07/01/2015   Subclinical hypothyroidism 07/01/2015   Allergic rhinitis 07/01/2015   Lumbosacral disc disease 07/01/2015   Sciatica neuralgia 07/01/2015   Class 3 severe obesity with body mass index (BMI) of 50.0 to 59.9 in adult [facility]) 07/01/2015    Past Surgical History:  Procedure Laterality Date   SLEEVE GASTROPLASTY  05/25/2021   laporoscopic at [facility]    OB History   No obstetric history on file.      Home Medications    Prior to Admission medications   Medication Sig Start Date End Date Taking? Authorizing Provider  promethazine-dextromethorphan (PROMETHAZINE-DM) 6.25-15 MG/5ML syrup Take 5 mLs by mouth 4 (four) times daily as needed. 07/17/23  Yes Particia Nearing, [name]  Biotin 10 MG CAPS Take by mouth.    [provider]  butalbital-acetaminophen-caffeine (FIORICET) 50-325-40 MG tablet Take 1 tablet by mouth every 8 (eight) hours as needed. 06/04/22   [provider]  famotidine (PEPCID) 20 MG tablet Take 20 mg by mouth 2 (two) times daily. 06/04/22   [provider]  ferrous sulfate 325 (65 FE) MG tablet Take by mouth.    [provider]  gabapentin (NEURONTIN) 100 MG capsule TAKE 3 CAPSULES(300 MG) BY MOUTH AT BEDTIME 05/11/23   Federico Flake, MD  hydrOXYzine (VISTARIL) 25 MG capsule Take 1 capsule (25 mg total) by mouth every 8 (eight) hours as needed for anxiety. 08/26/20   Danelle Berry, [name]  hydrOXYzine (VISTARIL) 25 MG capsule Take 1 capsule (25 mg total) by mouth every 8 (eight) hours as needed. 02/04/22   Danelle Berry, [name]  hyoscyamine (LEVSIN SL) 0.125 MG SL tablet SMARTSIG:2 Sublingual Twice Daily 06/04/22   [provider]  indomethacin (INDOCIN) 50 MG capsule Take 1 capsule (50 mg total) by mouth 3 (three) times daily as needed. 08/25/22   Berniece Salines, FNP  levothyroxine (SYNTHROID) 50 MCG tablet TAKE 1 TABLET(50 MCG) BY MOUTH DAILY BEFORE BREAKFAST 03/08/23   Danelle Berry, [name]  meclizine (ANTIVERT) 25 MG tablet Take 1 tablet (25 mg total) by mouth 3 (three) times daily as needed for dizziness. 06/15/23   Bethann Berkshire, MD  medroxyPROGESTERone (DEPO-PROVERA) 150 MG/ML injection Inject 1 mL (150 mg total) into the muscle every 3 (three) months. 04/05/23   Federico Flake, MD  metoCLOPramide (REGLAN) 5 MG tablet Take 5 mg by mouth 3 (three) times daily. 06/04/22   [provider]  misoprostol (CYTOTEC) 200 MCG tablet Take 200 mcg by mouth 4 (four) times daily. 05/21/22   [provider]  Multiple Vitamin (MULTI-VITAMIN) tablet Take 1 tablet by mouth daily.    [provider]  Multiple Vitamins-Minerals (ONE DAILY CALCIUM/IRON) TABS Take by mouth. 06/04/22   [provider]  ondansetron (ZOFRAN) 4 MG tablet Take 4 mg by mouth every 8 (eight) hours as needed. 06/04/22   [provider]  ondansetron (ZOFRAN-ODT) 4 MG disintegrating tablet 4mg  ODT q4 hours prn nausea/vomit 06/15/23   Bethann Berkshire, MD  pantoprazole (PROTONIX) 40 MG tablet Take 40 mg by mouth 2 (two) times daily. 06/04/22   [provider]  phentermine (ADIPEX-P) 37.5 MG tablet Take by mouth. 12/26/21   [provider]  promethazine (PHENERGAN) 12.5 MG tablet Take by mouth. 06/04/22   [provider]  sucralfate (CARAFATE) 1 g tablet Take 1 g by mouth 4 (four) times daily. 05/21/22   [provider]  traZODone (DESYREL) 50 MG tablet Take 0.5-1 tablets (25-50 mg total) by mouth at bedtime as needed for sleep. 04/08/23   Alba Cory, MD  triamcinolone ointment (KENALOG) 0.5 % Apply 1 Application topically 2 (two) times daily. 01/26/23   Mecum, Erin E, [name]   venlafaxine XR (EFFEXOR-XR) 37.5 MG 24 hr capsule TAKE 1 CAPSULE BY MOUTH EVERY MORNING WITH 75 MG CAPSULE FOR TOTAL OF 112.5 MG DAILY 12/06/22   Danelle Berry, [name]  venlafaxine XR (EFFEXOR-XR) 75 MG 24 hr capsule TAKE 1 CAPSULE BY MOUTH EVERY MORNING WITH 37.5 MG CAPSULE FOR TOTAL OF 112.5 MG DAILY 12/06/22   Danelle Berry, [name]    Family History Family History  Adopted: Yes  Problem Relation Age of Onset   Cancer Mother        breast cancer   Heart failure Father     Social History Social History   Tobacco Use   Smoking status: Never   Smokeless tobacco: Never  Vaping Use   Vaping status: Never Used  Substance Use Topics   Alcohol use: No    Alcohol/week: 0.0 standard drinks of alcohol    Comment: once a year   Drug use: No     Allergies   Sulfa antibiotics, Nsaids, Spearmint flavor, Wound dressing adhesive, and Oxycodone   Review of Systems Review of Systems Per HPI  Physical Exam  Triage Vital Signs ED Triage Vitals  Encounter Vitals Group     BP 07/17/23 1435 127/74     Systolic BP Percentile --      Diastolic BP Percentile --      Pulse Rate 07/17/23 1435 75     Resp 07/17/23 1435 20     Temp 07/17/23 1435 98.8 F (37.1 C)     Temp Source 07/17/23 1435 Oral     SpO2 07/17/23 1435 98 %     Weight --      Height --      Head Circumference --      Peak Flow --      Pain Score 07/17/23 1437 0     Pain Loc --      Pain Education --      Exclude from Growth Chart --    No data found.  Updated Vital Signs BP 127/74 (BP Location: Right Arm)   Pulse 75   Temp 98.8 F (37.1 C) (Oral)   Resp 20   LMP 06/22/2023 (Approximate)   SpO2 98%   Visual Acuity Right Eye Distance:   Left Eye Distance:   Bilateral Distance:    Right Eye Near:   Left Eye Near:    Bilateral Near:     Physical Exam Vitals and nursing note reviewed.  Constitutional:      Appearance: Normal appearance.  HENT:     Head: Atraumatic.     Right Ear: Tympanic membrane and  external ear normal.     Left Ear: Tympanic membrane and external ear normal.     Nose: Rhinorrhea present.     Mouth/Throat:     Mouth: Mucous membranes are moist.     Pharynx: Posterior oropharyngeal erythema present.  Eyes:     Extraocular Movements: Extraocular movements intact.     Conjunctiva/sclera: Conjunctivae normal.  Cardiovascular:     Rate and Rhythm: Normal rate and regular rhythm.     Heart sounds: Normal heart sounds.  Pulmonary:     Effort: Pulmonary effort is normal.     Breath sounds: Normal breath sounds. No wheezing or rales.  Musculoskeletal:        General: Normal range of motion.     Cervical back: Normal range of motion and neck supple.  Skin:    General: Skin is warm and dry.  Neurological:     Mental Status: She is alert and oriented to person, place, and time.  Psychiatric:        Mood and Affect: Mood normal.        Thought Content: Thought content normal.      UC Treatments / Results  Labs (all labs ordered are listed, but only abnormal results are displayed) Labs Reviewed  POC COVID19/FLU A&B COMBO    EKG   Radiology No results found.  Procedures Procedures (including critical care time)  Medications Ordered in UC Medications - No data to display  Initial Impression / Assessment and Plan / UC Course  I have reviewed the triage vital signs and the nursing notes.  Pertinent labs & imaging results that were available during my care of the patient were reviewed by me and considered in my medical decision making (see chart for details).     Vital signs and exam reassuring today and suspicious for viral respiratory infection.  COVID and flu testing negative in clinic, treat symptomatically with Phenergan DM, supportive over-the-counter medications and home care.  Work note given.  Return  for worsening symptoms.  Final Clinical Impressions(s) / UC Diagnoses   Final diagnoses:  Viral URI with cough   Discharge Instructions   None     ED Prescriptions     Medication Sig Dispense Auth. Provider   promethazine-dextromethorphan (PROMETHAZINE-DM) 6.25-15 MG/5ML syrup Take 5 mLs by mouth 4 (four) times daily as needed. 100 mL Particia Nearing, [place]      PDMP not reviewed this encounter.   Particia Nearing, [place] 07/17/23 [phone]

## 2023-07-18 ENCOUNTER — Ambulatory Visit (INDEPENDENT_AMBULATORY_CARE_PROVIDER_SITE_OTHER): Payer: BC Managed Care – PPO | Admitting: Family Medicine

## 2023-07-18 VITALS — BP 121/78 | HR 80 | Wt 230.0 lb

## 2023-07-18 DIAGNOSIS — N761 Subacute and chronic vaginitis: Secondary | ICD-10-CM

## 2023-07-18 MED ORDER — CLOBETASOL PROPIONATE 0.05 % EX OINT: TOPICAL_OINTMENT | CUTANEOUS | 5 refills | Status: DC

## 2023-07-18 NOTE — Progress Notes (Unsigned)
RGYN here for problem visit today.   Vaginal itching since August from front to back (Buttocks) .  Pt states itching is intense at times. Dilfucan did not help Vistaril gave a little bit of relief.

## 2023-07-18 NOTE — Progress Notes (Unsigned)
   GYNECOLOGY PROBLEM  VISIT ENCOUNTER NOTE  Subjective:   Melinda Fox is a 47 y.o. No obstetric history on file. female here for a problem GYN visit.  Current complaints: vaginal irritation.   Denies abnormal vaginal bleeding, discharge, pelvic pain, problems with intercourse or other gynecologic concerns.    Gynecologic History Patient's last menstrual period was 06/22/2023 (approximate).  Contraception: none  Health Maintenance Due  Topic Date Due   Hepatitis C Screening  Never done   COVID-19 Vaccine (5 - 2023-24 season) 04/17/2023    The following portions of the patient's history were reviewed and updated as appropriate: allergies, current medications, past family history, past medical history, past social history, past surgical history and problem list.  Review of Systems Pertinent items are noted in HPI.   Objective:  BP 121/78   Pulse 80   Wt 230 lb (104.3 kg)   LMP 06/22/2023 (Approximate)   BMI 34.97 kg/m  Gen: well appearing, NAD HEENT: no scleral icterus CV: RR Lung: Normal WOB Ext: warm well perfused  PELVIC: Normal appearing external genitalia; normal appearing vaginal mucosa and cervix.  No abnormal discharge noted.  Normal uterine size, no other palpable masses, no uterine or adnexal tenderness.   Assessment and Plan:  1. Subacute vaginitis LIkely LS Will trial steroids and consider bx in the future - clobetasol ointment (TEMOVATE) 0.05 %; Apply to affected area every night for 4 weeks, then every other day for 4 weeks and then twice a week for 4 weeks or until resolution.  Dispense: 30 g; Refill: 5   Please refer to After Visit Summary for other counseling recommendations.   Return if symptoms worsen or fail to improve.  Federico Flake, MD, MPH, ABFM Attending Physician Faculty Practice- [facility]

## 2023-08-15 ENCOUNTER — Encounter: Payer: Self-pay | Admitting: Oncology

## 2023-08-16 ENCOUNTER — Ambulatory Visit (HOSPITAL_COMMUNITY)
Admission: RE | Admit: 2023-08-16 | Discharge: 2023-08-16 | Disposition: A | Discharge status: Home / Self Care | Payer: BC Managed Care – PPO | Source: Ambulatory Visit | Attending: Family Medicine | Admitting: Family Medicine

## 2023-08-16 ENCOUNTER — Encounter (HOSPITAL_COMMUNITY): Payer: Self-pay

## 2023-08-16 DIAGNOSIS — R928 Other abnormal and inconclusive findings on diagnostic imaging of breast: Secondary | ICD-10-CM | POA: Insufficient documentation

## 2023-09-02 ENCOUNTER — Encounter: Payer: Self-pay | Admitting: Oncology

## 2023-09-05 ENCOUNTER — Ambulatory Visit: Payer: 59 | Attending: Neurology

## 2023-09-05 ENCOUNTER — Other Ambulatory Visit: Payer: Self-pay

## 2023-09-05 DIAGNOSIS — R42 Dizziness and giddiness: Secondary | ICD-10-CM | POA: Insufficient documentation

## 2023-09-05 NOTE — Therapy (Signed)
OUTPATIENT PHYSICAL THERAPY VESTIBULAR EVALUATION     Patient Name: Melinda Fox MRN: [id number] DOB:[birth year], 48 y.o., female Today's Date: 09/05/2023  END OF SESSION:  PT End of Session - 09/05/23 1557     Visit Number 1    Number of Visits 3    Date for PT Re-Evaluation 10/17/23    Authorization Type [employer]    PT Start Time 1600    PT Stop Time 1645    PT Time Calculation (min) 45 min             Past Medical History:  Diagnosis Date   Allergy    Anemia    Chronic migraine without aura without status migrainosus, not intractable    Depression    Elevated blood-pressure reading without diagnosis of hypertension    Moderate concentric left ventricular hypertrophy 06/01/2017   Echocardiogram October 2018   Morbid obesity with body mass index (BMI) of 60.0 to 69.9 in adult (HCC) 07/01/2015   Scoliosis    Sleep apnea, obstructive    Thyroid disease    Vasculitis (HCC)    Past Surgical History:  Procedure Laterality Date   SLEEVE GASTROPLASTY  05/25/2021   laporoscopic at [facility]   Patient Active Problem List   Diagnosis Date Noted   Ganglion cyst of wrist, right 09/13/2022   Muscle strain of right shoulder 09/13/2022   Chronic pain of right wrist 09/13/2022   Pain in joint of right shoulder 09/13/2022   Gastroesophageal reflux disease without esophagitis 06/04/2022   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   Sinus bradycardia by electrocardiogram 04/07/2022   Intra-abdominal fluid collection 10/03/2021   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   S/P biliopancreatic diversion with duodenal switch 06/04/2021   Morbid obesity with BMI of 40.0-44.9, adult (HCC) 05/12/2021   OSA on CPAP 01/05/2021   Abnormal MRI, cervical spine 03/18/2020   Left carpal tunnel syndrome 03/13/2020   Ulnar neuropathy of left upper extremity 03/13/2020   Decreased grip strength of left hand 01/29/2020   Numbness and tingling in left arm 01/29/2020    Simple chronic bronchitis (HCC) 03/05/2019   Paroxysmal tachycardia (HCC) 10/23/2018   Moderate concentric left ventricular hypertrophy 06/01/2017   Iron deficiency anemia 07/01/2015   Migraine without aura and with status migrainosus, not intractable 07/01/2015   Major depression, recurrent, full remission (HCC) 07/01/2015   Subclinical hypothyroidism 07/01/2015   Allergic rhinitis 07/01/2015   Lumbosacral disc disease 07/01/2015   Sciatica neuralgia 07/01/2015   Class 3 severe obesity with body mass index (BMI) of 50.0 to 59.9 in adult (HCC) 07/01/2015    PCP: Carren Rang, [name] REFERRING PROVIDER: Morene Crocker, MD  REFERRING DIAG: R42 (ICD-10-CM) - Dizziness and giddiness  THERAPY DIAG:  Dizziness and giddiness  ONSET DATE: "several months worsened by recent surgeries"  Rationale for Evaluation and Treatment: Rehabilitation  SUBJECTIVE:   SUBJECTIVE STATEMENT: Dizziness for several months. Pt notes hx of 3 surgeries over 3 years from bariatric surgeries and notes ongoing issues with esophageal strictures requiring repeated dilation.  Most recently had hernia surgery with subsequent complications. Notes after D/C from hospitalization was having syncopal episodes but reports these have improved since being able to eat/drink more liberally. Notes spontaneous feeling of dizzy even when seated at rest, laying down, or standing. Notes sitting to standing symptoms are more intense.  Reports intermittent tinnitus, no obvious photo/phonophobia, no hearing loss, notes hx of migraine with HA and occasional aura variety.   Pt accompanied by: self  PERTINENT  HISTORY:  began several months ago. Underwent hiatal hernia surgery. Was having issues with dehydration, unable to keep food down. Initially dizziness was attributed to dehydration orthostatic hypotension. Did have some syncopal and presyncopal episodes associated with this. No chest pain, dyspnea. Does have long hx of  palpitations, bradycardia. Has not previously seen cardiology. Patient states dizziness has been ongoing. Is happening multiple times a day. Last for seconds to minutes. Reports sensation that she is spinning. Can be triggered by movement or occur at rest. Does report some hearing loss which has been going on for some time as well as more recent onset of tinnitus. Reports history of migraines but dizziness is not associated with migraines. Overall migraines are well-controlled. Endorses imbalance and near falls. Denies head injury from falls. Also reports brain fog over the past few months. Reports sleep difficulty, taking trazodone as directed by primary care. Prior history of vertigo. Is prescribed meclizine 25 mg which she is taking as needed.  past medical history of LVH, Raynaud's, OSA, asthma, hypothyroidism, MDD, iron deficiency anemia, migraine, globus sensation, GERD, hiatal hernia, left carpal tunnel, presenting with dizziness and spinning sensation since October. Also reports brain fog. -MRI brain from 06/15/2023 was normal, motion degraded.Referral to ENT for vertigo, hearing loss, tinnitus, chronic rhinorrhea  PAIN:  Are you having pain? No  PRECAUTIONS: None  RED FLAGS: None   WEIGHT BEARING RESTRICTIONS: No  FALLS: Has patient fallen in last 6 months? Yes. Number of falls several because of dizziness  LIVING ENVIRONMENT: Lives with: lives with their family Lives in: House/apartment Stairs:  yes Has following equipment at home: None  PLOF: Independent, works as Engineer, site in [employer]  PATIENT GOALS: reduce symptoms  OBJECTIVE:  Note: Objective measures were completed at Evaluation unless otherwise noted.  DIAGNOSTIC FINDINGS:   COGNITION: Overall cognitive status: Within functional limits for tasks assessed   SENSATION: Not tested  EDEMA:    MUSCLE TONE:  NT  DTRs:  NT  POSTURE:  No Significant postural limitations  Cervical ROM:    Active  A/PROM (deg) eval  Flexion WNL  Extension WNL  Right lateral flexion WNL  Left lateral flexion WNL  Right rotation WNL  Left rotation WNL  (Blank rows = not tested)  STRENGTH: NT    BED MOBILITY:  indep  TRANSFERS: indep   GAIT: Gait pattern: WFL Distance walked:  Assistive device utilized: None Level of assistance: Complete Independence Comments:   FUNCTIONAL TESTS:    PATIENT SURVEYS:  FOTO 49 (predicted 58)  VESTIBULAR ASSESSMENT:  GENERAL OBSERVATION:    SYMPTOM BEHAVIOR:  Subjective history: 3-6 months onset  Non-Vestibular symptoms:  post-op complications with upper GI issues  Type of dizziness: Spinning/Vertigo and Unsteady with head/body turns  Frequency: daily  Duration: seconds-minutes  Aggravating factors: Spontaneous and Induced by position change: lying supine, supine to sit, and sit to stand  Relieving factors: slow movements  Progression of symptoms: unchanged  OCULOMOTOR EXAM:  Ocular Alignment: normal  Ocular ROM: No Limitations  Spontaneous Nystagmus: absent  Gaze-Induced Nystagmus: absent  Smooth Pursuits: intact  Saccades: intact  Convergence/Divergence: 6 cm     VESTIBULAR - OCULAR REFLEX:   Slow VOR: Comment: with horizontal reports feeling of room spinning, some with vertical  VOR Cancellation: Comment: notes imbalance/"shaking background"  Head-Impulse Test: negative left/right  Dynamic Visual Acuity: Not able to be assessed   POSITIONAL TESTING: Right Dix-Hallpike: no nystagmus Left Dix-Hallpike: no nystagmus Right Roll Test: no nystagmus Left Roll Test: no nystagmus  MOTION SENSITIVITY:  Motion Sensitivity Quotient Intensity: 0 = none, 1 = Lightheaded, 2 = Mild, 3 = Moderate, 4 = Severe, 5 = Vomiting  Intensity  1. Sitting to supine   2. Supine to L side   3. Supine to R side   4. Supine to sitting   5. L Hallpike-Dix   6. Up from L    7. R Hallpike-Dix 1-2  8. Up from R    9. Sitting, head tipped to L knee    10. Head up from L knee   11. Sitting, head tipped to R knee   12. Head up from R knee   13. Sitting head turns x5   14.Sitting head nods x5   15. In stance, 180 turn to L    16. In stance, 180 turn to R     OTHOSTATICS: Supine: 109/70 with HR 57 bpm;  Standing x 1 min: 97/70 with HR 75 bpm; Standing x 3 min: 97/65 with HR 77 bpm  FUNCTIONAL GAIT: Functional gait assessment: TBD  M-CTSIB:  M-CTSIB  Condition 1: Firm Surface, EO 30 Sec, Normal Sway  Condition 2: Firm Surface, EC 30 Sec, Normal Sway  Condition 3: Foam Surface, EO 30 Sec, Normal Sway  Condition 4: Foam Surface, EC 30 Sec, Mild and Moderate Sway                                                                                                                               TREATMENT DATE: 09/05/23   Canalith Repositioning:   Gaze Adaptation:  x1 Viewing Horizontal: Comment: seated 3-5x 15-30 sec Habituation:  Brandt-Daroff: number of reps: 5 Other:   PATIENT EDUCATION: Education details: assessment details, rationale of HEP/intervention Person educated: Patient Education method: Explanation Education comprehension: verbalized understanding  HOME EXERCISE PROGRAM: Access Code: [email or web address] URL: [email or web address] Date: 09/05/2023 Prepared by: Shary Decamp  Exercises - Seated Gaze Stabilization with Head Rotation  - 1 x daily - 7 x weekly - 3-5 sets - 15-30 sec hold - Brandt-Daroff Vestibular Exercise  - 1 x daily - 7 x weekly - 5 reps   GOALS: Goals reviewed with patient? Yes  SHORT TERM GOALS: Target date: same as LTG    LONG TERM GOALS: Target date: 10/17/2023    Patient will be independent in HEP to improve functional outcomes Baseline:  Goal status: INITIAL  2.  Report improved symptoms per expected outcome FOTO (58) Baseline: 49 Goal status: INITIAL  3.  Demo low risk for falls per score 25/30 Functional Gait Assessment Baseline: TBD Goal status:  INITIAL    ASSESSMENT:  CLINICAL IMPRESSION: Patient is a 48 y.o. lady who was seen today for physical therapy evaluation and treatment for dizziness.  Pt reports onset of symptoms following recent hx of bariatric and hernia surgeries experiencing feeling of spinning with positional changes from supine to sit, sit to stand; but also notes episodes can occur spontaneously when sitting and  overall symptoms tend to last seconds-minutes.  Ocuolomotor exam unrevealing and vestibular testing experiences provoked symptoms with horizontal VOR and VOR cancellation but otherwise unremarkable.  Positional testing without provocation of dizziness or nystagmus appreciated, but experiencing motion sensitivity with arising from test positions.  Orthostatic hypotension assessed for and this too was unrevealing other than a lower baseline BP.  Discussed rationale of PT intervention with instruction in gentle habituation activities and to try these things over the next couple of weeks to self-assess for habituation to movements.  Recommend f/u to assess if amenable to these activities and provide relevant progressions from there as well as additional assessment   OBJECTIVE IMPAIRMENTS: decreased activity tolerance and dizziness.   ACTIVITY LIMITATIONS: bending, standing, transfers, bed mobility, and locomotion level  PARTICIPATION LIMITATIONS: meal prep, cleaning, laundry, driving, shopping, community activity, and occupation  PERSONAL FACTORS: Time since onset of injury/illness/exacerbation and 3+ comorbidities: PMH and surgical hx  are also affecting patient's functional outcome.   REHAB POTENTIAL: Fair based on time since onset interaction of conditions  CLINICAL DECISION MAKING: Evolving/moderate complexity  EVALUATION COMPLEXITY: Moderate   PLAN:  PT FREQUENCY:  recommend additional f/u in 2-3 weeks for additional assessment and HEP review  PT DURATION: 6 weeks  PLANNED INTERVENTIONS: 97164- PT  Re-evaluation, 97110-Therapeutic exercises, 97530- Therapeutic activity, 97112- Neuromuscular re-education, 97535- Self Care, 14782- Manual therapy, [phone]- Gait training, [phone]- Canalith repositioning, [phone]- Aquatic Therapy, Patient/Family education, Balance training, Stair training, Dry Needling, Joint mobilization, Spinal mobilization, Vestibular training, DME instructions, Cryotherapy, and Moist heat  PLAN FOR NEXT SESSION: f/u on HEP activities, additional progression/habituation? FGA if needed   5:10 PM, 09/05/23 M. Shary Decamp, PT, DPT Physical Therapist- [place] Office Number: [phone]

## 2023-09-20 ENCOUNTER — Ambulatory Visit: Payer: 59

## 2023-09-20 DIAGNOSIS — Z3042 Encounter for surveillance of injectable contraceptive: Secondary | ICD-10-CM | POA: Diagnosis not present

## 2023-09-20 MED ORDER — MEDROXYPROGESTERONE ACETATE 150 MG/ML IM SUSY: PREFILLED_SYRINGE | Freq: Once | INTRAMUSCULAR | Status: AC

## 2023-09-20 NOTE — Progress Notes (Signed)
Date last pap: 03/11/23. Last Depo-Provera: 07/04/23. Side Effects if any: NA. Serum HCG indicated? NA. Depo-Provera 150 mg IM given by: Taylor,CMA. Next appointment due 4-22/5-6.

## 2023-09-26 ENCOUNTER — Ambulatory Visit: Payer: 59

## 2023-11-12 ENCOUNTER — Encounter: Payer: Self-pay | Admitting: Family Medicine

## 2023-12-06 ENCOUNTER — Ambulatory Visit: Payer: BC Managed Care – PPO

## 2023-12-06 VITALS — BP 97/63 | HR 67

## 2023-12-06 DIAGNOSIS — Z3042 Encounter for surveillance of injectable contraceptive: Secondary | ICD-10-CM

## 2023-12-06 MED ORDER — MEDROXYPROGESTERONE ACETATE 150 MG/ML IM SUSY
150.0000 mg | PREFILLED_SYRINGE | Freq: Once | INTRAMUSCULAR | Status: AC
  Administered 2023-12-06: 150 mg via INTRAMUSCULAR

## 2023-12-06 NOTE — Progress Notes (Signed)
 Date last pap: 03/11/23. Last Depo-Provera : 09/20/23. Side Effects if any: N/A. Serum HCG indicated? N/A. Depo-Provera  150 mg IM given by: Toya L CMA . Next appointment due 7/8-7/22.   Pt asked if Depo can be self administered .  I consulted with office manager pt can self administer Depo and Mychart us  proper documentation.

## 2024-01-30 ENCOUNTER — Other Ambulatory Visit (HOSPITAL_COMMUNITY)
Admission: RE | Admit: 2024-01-30 | Discharge: 2024-01-30 | Disposition: A | Discharge status: Home / Self Care | Source: Ambulatory Visit | Attending: Family Medicine | Admitting: Family Medicine

## 2024-01-30 ENCOUNTER — Encounter: Payer: Self-pay | Admitting: Family Medicine

## 2024-01-30 ENCOUNTER — Ambulatory Visit: Admitting: Family Medicine

## 2024-01-30 VITALS — BP 99/66 | HR 83

## 2024-01-30 DIAGNOSIS — R21 Rash and other nonspecific skin eruption: Secondary | ICD-10-CM | POA: Diagnosis present

## 2024-01-30 DIAGNOSIS — R232 Flushing: Secondary | ICD-10-CM | POA: Diagnosis not present

## 2024-01-30 DIAGNOSIS — N904 Leukoplakia of vulva: Secondary | ICD-10-CM | POA: Diagnosis not present

## 2024-01-30 DIAGNOSIS — F3342 Major depressive disorder, recurrent, in full remission: Secondary | ICD-10-CM

## 2024-01-31 ENCOUNTER — Ambulatory Visit: Payer: Self-pay | Admitting: Family Medicine

## 2024-01-31 DIAGNOSIS — B3731 Acute candidiasis of vulva and vagina: Secondary | ICD-10-CM

## 2024-01-31 LAB — TSH+FREE T4
Free T4: 0.96 ng/dL (ref 0.82–1.77)
TSH: 2.41 u[IU]/mL (ref 0.450–4.500)

## 2024-01-31 LAB — VITAMIN D 25 HYDROXY (VIT D DEFICIENCY, FRACTURES): Vit D, 25-Hydroxy: 49.9 ng/mL (ref 30.0–100.0)

## 2024-01-31 LAB — MAGNESIUM: Magnesium: 2.1 mg/dL (ref 1.6–2.3)

## 2024-01-31 NOTE — Progress Notes (Signed)
   GYNECOLOGY PROBLEM  VISIT ENCOUNTER NOTE  Subjective:   Melinda Fox is a 48 y.o. female here for a problem GYN visit.  Current complaints: itching, right labia. Has not responded to steroid cream.   Denies abnormal vaginal bleeding, discharge, pelvic pain, problems with intercourse or other gynecologic concerns.    Gynecologic History No LMP recorded.  Contraception: none  Health Maintenance Due  Topic Date Due   Hepatitis C Screening  Never done   Pneumococcal Vaccine 46-70 Years old (1 of 2 - PCV) Never done   COVID-19 Vaccine (5 - 2024-25 season) 04/17/2023    The following portions of the patient's history were reviewed and updated as appropriate: allergies, current medications, past family history, past medical history, past social history, past surgical history and problem list.  Review of Systems Pertinent items are noted in HPI.   Objective:  BP 99/66   Pulse 83   Gen: well appearing, NAD HEENT: no scleral icterus CV: RR Lung: Normal WOB Ext: warm well perfused  PELVIC: white appearing external genitalia- noted on the right labia majora, mostly on the lower edge; normal appearing vaginal mucosa and cervix.  No abnormal discharge noted.    VULVAR BIOPSY NOTE The indications for vulvar biopsy (rule out neoplasia, establish lichen sclerosus diagnosis) were reviewed.   Risks of the biopsy including pain, bleeding, infection, inadequate specimen, scarring and need for additional procedures  were discussed. The patient stated understanding and agreed to undergo procedure today. Consent was signed,  time out performed.   The patient's vulva was prepped with Betadine. 1% lidocaine was injected into lower right labia majora. A 4-mm punch biopsy was done, biopsy tissue was picked up with sterile forceps and sterile scissors were used to excise the lesion.  Small bleeding was noted and hemostasis was achieved using silver nitrate sticks.  The patient tolerated the  procedure well. Post-procedure instructions  (pelvic rest for one week) were given to the patient. The patient is to call with heavy bleeding, fever greater than 100.4, foul smelling vaginal discharge or other concerns. The patient will be return to clinic in two weeks for discussion of results.  Assessment and Plan:  1. Hot flashes (Primary) - failed gabapentin -- still having hot flashes at night - TSH + free T4 - VITAMIN D  25 Hydroxy (Vit-D Deficiency, Fractures) - Magnesium  2. Major depression, recurrent, full remission (HCC)  3. Vulvar rash Due to non-response to steroid consented for punch bx - Surgical pathology( [place]/ [facility])   Please refer to After Visit Summary for other counseling recommendations.   Return if symptoms worsen or fail to improve.  Abner Ables, MD, MPH, ABFM Attending Physician Faculty Practice- [facility]

## 2024-02-02 LAB — SURGICAL PATHOLOGY

## 2024-02-12 MED ORDER — NYSTATIN 100000 UNIT/GM EX CREA: 1.0000 | TOPICAL_CREAM | Freq: Two times a day (BID) | CUTANEOUS | 1 refills | Status: DC

## 2024-02-12 MED ORDER — FLUCONAZOLE 150 MG PO TABS: 150.0000 mg | ORAL_TABLET | Freq: Once | ORAL | 3 refills | Status: AC

## 2024-02-15 ENCOUNTER — Encounter: Payer: Self-pay | Admitting: Family Medicine

## 2024-02-15 DIAGNOSIS — G4700 Insomnia, unspecified: Secondary | ICD-10-CM

## 2024-02-15 MED ORDER — HYDROXYZINE PAMOATE 25 MG PO CAPS: 25.0000 mg | ORAL_CAPSULE | Freq: Three times a day (TID) | ORAL | 6 refills | Status: DC | PRN

## 2024-02-21 ENCOUNTER — Ambulatory Visit: Payer: BC Managed Care – PPO

## 2024-02-27 ENCOUNTER — Encounter: Payer: Self-pay | Admitting: Family Medicine

## 2024-02-27 ENCOUNTER — Other Ambulatory Visit: Payer: Self-pay

## 2024-02-27 NOTE — Progress Notes (Signed)
 Pt sent message in Mychart  To make aware of Self Administration of Depo.  I did the shot on 02/20/2024 at 8:56 AM Lot [id number] 2027/11 [id number]'

## 2024-02-28 ENCOUNTER — Encounter: Payer: Self-pay | Admitting: Family Medicine

## 2024-04-21 ENCOUNTER — Ambulatory Visit
Admission: EM | Admit: 2024-04-21 | Discharge: 2024-04-21 | Disposition: A | Discharge status: Home / Self Care | Attending: Family Medicine | Admitting: Family Medicine

## 2024-04-21 ENCOUNTER — Telehealth: Payer: Self-pay

## 2024-04-21 DIAGNOSIS — J069 Acute upper respiratory infection, unspecified: Secondary | ICD-10-CM

## 2024-04-21 LAB — POC SOFIA SARS ANTIGEN FIA: SARS Coronavirus 2 Ag: NEGATIVE

## 2024-04-21 NOTE — ED Triage Notes (Signed)
 Pt reports cough, congestion, chest pressure x 1 day. Has used tylenol .

## 2024-04-21 NOTE — Discharge Instructions (Signed)
 We will give you call if your COVID test comes back positive.  Your vital signs and exam are very reassuring today, I suspect you have a viral respiratory infection starting.  I recommend taking medications such as DayQuil, NyQuil, Flonase  twice daily, saline sinus rinses, humidifiers, over-the-counter pain and fever reducers as needed.

## 2024-04-21 NOTE — Telephone Encounter (Signed)
 Contacted pt to report results of testing and providers recommendations pt has verbalized understanding.

## 2024-04-21 NOTE — ED Provider Notes (Signed)
 [facility]    CSN: [id number] Arrival date & time: 04/21/24  9173      History   Chief Complaint No chief complaint on file.   HPI Melinda Fox is a 48 y.o. female.   Patient presenting today with 1 day history of cough, nasal congestion, chest congestion, fatigue.  Denies fever, chills, body aches, chest pain, shortness of breath, wheezing, abdominal pain, vomiting, diarrhea.  So far has only tried Tylenol  for symptoms, otherwise not trying anything over-the-counter for symptoms.  Multiple sick contacts, teaches at a high school.  No known diagnosed history of chronic pulmonary disease but states she is prone to bronchitis and always gets amoxicillin  when this happens.  History of seasonal allergies not currently on an allergy regimen.  She also notes that albuterol  inhalers do nothing for her when she has been prescribed them in the past for bronchitis.    Past Medical History:  Diagnosis Date   Allergy    Anemia    Chronic migraine without aura without status migrainosus, not intractable    Depression    Elevated blood-pressure reading without diagnosis of hypertension    Moderate concentric left ventricular hypertrophy 06/01/2017   Echocardiogram October 2018   Morbid obesity with body mass index (BMI) of 60.0 to 69.9 in adult [facility]) 07/01/2015   Scoliosis    Sleep apnea, obstructive    Thyroid  disease    Vasculitis (HCC)     Patient Active Problem List   Diagnosis Date Noted   Ganglion cyst of wrist, right 09/13/2022   Muscle strain of right shoulder 09/13/2022   Chronic pain of right wrist 09/13/2022   Pain in joint of right shoulder 09/13/2022   Gastroesophageal reflux disease without esophagitis 06/04/2022   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   Sinus bradycardia by electrocardiogram 04/07/2022   Intra-abdominal fluid collection 10/03/2021   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   Morbid obesity with BMI of 40.0-44.9, adult (HCC) 05/12/2021   OSA on CPAP 01/05/2021   Abnormal MRI, cervical spine 03/18/2020   Left carpal tunnel syndrome 03/13/2020   Ulnar neuropathy of left upper extremity 03/13/2020   Decreased grip strength of left hand 01/29/2020   Numbness and tingling in left arm 01/29/2020   Simple chronic bronchitis (HCC) 03/05/2019   Paroxysmal tachycardia (HCC) 10/23/2018   Moderate concentric left ventricular hypertrophy 06/01/2017   Iron  deficiency anemia 07/01/2015   Migraine without aura and with status migrainosus, not intractable 07/01/2015   Major depression, recurrent, full remission (HCC) 07/01/2015   Subclinical hypothyroidism 07/01/2015   Allergic rhinitis 07/01/2015   Lumbosacral disc disease 07/01/2015   Sciatica neuralgia 07/01/2015   Class 3 severe obesity with body mass index (BMI) of 50.0 to 59.9 in adult 07/01/2015    Past Surgical History:  Procedure Laterality Date   HERNIA REPAIR     SLEEVE GASTROPLASTY  05/25/2021   laporoscopic at [facility]    OB History   No obstetric history on file.      Home Medications    Prior to Admission medications   Medication Sig Start Date End Date Taking? Authorizing Provider  Biotin 10 MG CAPS Take by mouth.    [provider]  clobetasol  ointment (TEMOVATE ) 0.05 % Apply to affected area every night for 4 weeks, then every other day for 4 weeks and then twice a week for 4 weeks or until resolution. 07/18/23   Eldonna Suzen Octave, MD  docusate  sodium (COLACE) 100 MG capsule Take 100 mg by mouth 2 (two) times daily. 06/04/23   [provider]  famotidine (PEPCID) 20 MG tablet Take 20 mg by mouth 2 (two) times daily. 06/04/22   [provider]  ferrous sulfate 325 (65 FE) MG tablet Take by mouth.    [provider]  gabapentin  (NEURONTIN ) 100 MG capsule TAKE 3 CAPSULES(300 MG) BY MOUTH AT BEDTIME 05/11/23   Eldonna Suzen Octave,  MD  hydrOXYzine  (VISTARIL ) 25 MG capsule Take 1 capsule (25 mg total) by mouth every 8 (eight) hours as needed. 02/15/24   Eldonna Suzen Octave, MD  indomethacin  (INDOCIN ) 50 MG capsule Take 1 capsule (50 mg total) by mouth 3 (three) times daily as needed. Patient not taking: Reported on 07/18/2023 08/25/22   Gareth Mliss FALCON, FNP  levothyroxine  (SYNTHROID ) 50 MCG tablet Take 50 mcg by mouth. 07/19/23   [provider]  meclizine  (ANTIVERT ) 25 MG tablet Take 1 tablet (25 mg total) by mouth 3 (three) times daily as needed for dizziness. 06/15/23   Suzette Pac, MD  medroxyPROGESTERone  (DEPO-PROVERA ) 150 MG/ML injection Inject 1 mL (150 mg total) into the muscle every 3 (three) months. 04/05/23   Eldonna Suzen Octave, MD  melatonin 3 MG TABS tablet Take by mouth. 06/04/23   [provider]  Multiple Vitamins-Minerals (ONE DAILY CALCIUM/IRON ) TABS Take by mouth. 06/04/22   [provider]  nystatin  cream (MYCOSTATIN ) Apply 1 Application topically 2 (two) times daily. Use for 7 days 02/12/24   Eldonna Suzen Octave, MD  ondansetron  (ZOFRAN ) 4 MG tablet Take 4 mg by mouth every 8 (eight) hours as needed. Patient not taking: Reported on 07/18/2023 06/04/22   [provider]  pantoprazole (PROTONIX) 40 MG tablet Take 40 mg by mouth 2 (two) times daily. 06/04/22   [provider]  phentermine (ADIPEX-P) 37.5 MG tablet Take by mouth. Patient not taking: Reported on 07/18/2023 12/26/21   [provider]  traZODone  (DESYREL ) 50 MG tablet Take 50 mg by mouth at bedtime. 08/18/23 08/17/24  [provider]  triamcinolone  ointment (KENALOG ) 0.5 % Apply 1 Application topically 2 (two) times daily. Patient not taking: Reported on 07/18/2023 01/26/23   Mecum, Erin E, [name]  venlafaxine  XR (EFFEXOR -XR) 150 MG 24 hr capsule Take 150 mg by mouth daily with breakfast. 12/13/23   [provider]    Family History Family History  Adopted: Yes  Problem  Relation Age of Onset   Cancer Mother        breast cancer   Heart failure Father     Social History Social History   Tobacco Use   Smoking status: Never   Smokeless tobacco: Never  Vaping Use   Vaping status: Never Used  Substance Use Topics   Alcohol use: No    Alcohol/week: 0.0 standard drinks of alcohol    Comment: once a year   Drug use: No     Allergies   Sulfa antibiotics, Spearmint oil, Codeine, Nsaids, Spearmint flavoring agent (non-screening), Wound dressing adhesive, and Oxycodone   Review of Systems Review of Systems Per HPI  Physical Exam Triage Vital Signs ED Triage Vitals [04/21/24 0955]  Encounter Vitals Group     BP 91/63     Girls Systolic BP Percentile      Girls Diastolic BP Percentile      Boys Systolic BP Percentile      Boys Diastolic BP Percentile      Pulse Rate 68  Resp 18     Temp 98.4 F (36.9 C)     Temp Source Oral     SpO2 96 %     Weight      Height      Head Circumference      Peak Flow      Pain Score      Pain Loc      Pain Education      Exclude from Growth Chart    No data found.  Updated Vital Signs BP 91/63 (BP Location: Right Arm)   Pulse 68   Temp 98.4 F (36.9 C) (Oral)   Resp 18   SpO2 96%   Visual Acuity Right Eye Distance:   Left Eye Distance:   Bilateral Distance:    Right Eye Near:   Left Eye Near:    Bilateral Near:     Physical Exam Vitals and nursing note reviewed.  Constitutional:      Appearance: Normal appearance.  HENT:     Head: Atraumatic.     Right Ear: Tympanic membrane and external ear normal.     Left Ear: Tympanic membrane and external ear normal.     Nose: Rhinorrhea present.     Mouth/Throat:     Mouth: Mucous membranes are moist.     Pharynx: No oropharyngeal exudate or posterior oropharyngeal erythema.  Eyes:     Extraocular Movements: Extraocular movements intact.     Conjunctiva/sclera: Conjunctivae normal.  Cardiovascular:     Rate and Rhythm: Normal rate and  regular rhythm.     Heart sounds: Normal heart sounds.  Pulmonary:     Effort: Pulmonary effort is normal.     Breath sounds: Normal breath sounds. No wheezing or rales.  Musculoskeletal:        General: Normal range of motion.     Cervical back: Normal range of motion and neck supple.  Skin:    General: Skin is warm and dry.  Neurological:     Mental Status: She is alert and oriented to person, place, and time.  Psychiatric:     Comments: Agitated, argumentative during exam      UC Treatments / Results  Labs (all labs ordered are listed, but only abnormal results are displayed) Labs Reviewed  POC SOFIA SARS ANTIGEN FIA    EKG   Radiology No results found.  Procedures Procedures (including critical care time)  Medications Ordered in UC Medications - No data to display  Initial Impression / Assessment and Plan / UC Course  I have reviewed the triage vital signs and the nursing notes.  Pertinent labs & imaging results that were available during my care of the patient were reviewed by me and considered in my medical decision making (see chart for details).     Vital signs all within normal limits, she is well-appearing and in no acute distress with no concerning exam findings.  Exam findings very consistent with a new viral respiratory infection onset yesterday.  Discussed at length with patient that antibiotics are in no way warranted in this situation and that starting an antibiotic for a viral infection does not in fact speed recovery time or head it off at the pass as she states it does for her.  She states that this is what she is always treated with in the situation and that is the only thing that works for her.  Offered numerous medications such as nasal sprays, cough syrups, decongestants, and over-the-counter remedies to assist  with symptom management and these were all declined.  COVID test today negative, suspect a different strain of virus causing symptoms.   Recommendations reiterated.  Follow-up for significantly worsening symptoms.  Final Clinical Impressions(s) / UC Diagnoses   Final diagnoses:  Viral URI with cough     Discharge Instructions      We will give you call if your COVID test comes back positive.  Your vital signs and exam are very reassuring today, I suspect you have a viral respiratory infection starting.  I recommend taking medications such as DayQuil, NyQuil, Flonase  twice daily, saline sinus rinses, humidifiers, over-the-counter pain and fever reducers as needed.    ED Prescriptions   None    PDMP not reviewed this encounter.   Stuart Vernell Norris, [place] 04/21/24 1105

## 2024-05-18 ENCOUNTER — Encounter: Payer: Self-pay | Admitting: Family Medicine

## 2024-05-18 ENCOUNTER — Other Ambulatory Visit: Payer: Self-pay | Admitting: Family Medicine

## 2024-05-18 DIAGNOSIS — B3731 Acute candidiasis of vulva and vagina: Secondary | ICD-10-CM

## 2024-06-08 MED ORDER — FLUCONAZOLE 150 MG PO TABS: 150.0000 mg | ORAL_TABLET | ORAL | 0 refills | Status: AC | PRN

## 2024-06-08 MED ORDER — NYSTATIN 100000 UNIT/GM EX CREA: TOPICAL_CREAM | Freq: Two times a day (BID) | CUTANEOUS | 1 refills | Status: AC
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
## 2024-06-11 ENCOUNTER — Encounter: Payer: Self-pay | Admitting: Psychiatry

## 2024-06-11 ENCOUNTER — Ambulatory Visit (INDEPENDENT_AMBULATORY_CARE_PROVIDER_SITE_OTHER): Payer: Self-pay | Admitting: Psychiatry

## 2024-06-11 ENCOUNTER — Other Ambulatory Visit: Payer: Self-pay

## 2024-06-11 VITALS — BP 110/75 | HR 67 | Temp 97.6°F | Ht 68.0 in | Wt 254.2 lb

## 2024-06-11 DIAGNOSIS — G4700 Insomnia, unspecified: Secondary | ICD-10-CM

## 2024-06-11 DIAGNOSIS — R41840 Attention and concentration deficit: Secondary | ICD-10-CM

## 2024-06-11 DIAGNOSIS — F331 Major depressive disorder, recurrent, moderate: Secondary | ICD-10-CM | POA: Diagnosis not present

## 2024-06-11 MED ORDER — RAMELTEON 8 MG PO TABS: 8.0000 mg | ORAL_TABLET | Freq: Every evening | ORAL | 1 refills | Status: DC | PRN
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
## 2024-06-11 NOTE — Patient Instructions (Addendum)
 Start Concerta 18 mg daily  Continue venlafaxine  150 mg daily  Start ramelteon 8 mg at night as needed for insomnia  Hold melatonin, trazodone  Next appointment: 12/18 at 4:30

## 2024-06-12 ENCOUNTER — Other Ambulatory Visit: Payer: Self-pay | Admitting: Family Medicine

## 2024-06-12 DIAGNOSIS — Z3042 Encounter for surveillance of injectable contraceptive: Secondary | ICD-10-CM

## 2024-06-14 ENCOUNTER — Other Ambulatory Visit: Payer: Self-pay

## 2024-06-14 ENCOUNTER — Telehealth: Payer: Self-pay | Admitting: Psychiatry

## 2024-06-14 DIAGNOSIS — Z3042 Encounter for surveillance of injectable contraceptive: Secondary | ICD-10-CM

## 2024-06-14 MED ORDER — MEDROXYPROGESTERONE ACETATE 150 MG/ML IM SUSP: 150.0000 mg | INTRAMUSCULAR | 3 refills | Status: AC

## 2024-06-14 MED ORDER — METHYLPHENIDATE HCL ER (OSM) 18 MG PO TBCR: 18.0000 mg | EXTENDED_RELEASE_TABLET | Freq: Every day | ORAL | 0 refills | Status: DC

## 2024-06-14 MED ORDER — METHYLPHENIDATE HCL ER (OSM) 18 MG PO TBCR: 18.0000 mg | EXTENDED_RELEASE_TABLET | Freq: Every day | ORAL | 0 refills | Status: AC

## 2024-06-14 NOTE — Telephone Encounter (Signed)
 Reviewed notes from [facility] neuropsychological services (will be scanned). 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  Test performance suggested mostly mild, yet significant, neurocognitive this visit in the setting of presumed above average premorbid abilities.  She has a weakness in higher-order cognitive abilities.  These longstanding issues, combined with subjective worsening of efficacy and every day memory can be explained by neurodivergence (ADHD and subthreshold ASD), amplified now by undertreated mood disorder.  Cortical neurodegenerative disease is unlikely.

## 2024-06-18 ENCOUNTER — Telehealth: Payer: Self-pay

## 2024-06-18 NOTE — Telephone Encounter (Signed)
 Prior authorization submitted via covermymeds for the patients Methylphenidate HCI ER OSM 18 mg tablets sent to patient insurance    Approved  Coverage [id number]////[id number]  Patient made aware of the medication approval

## 2024-07-16 ENCOUNTER — Ambulatory Visit: Admitting: Obstetrics & Gynecology

## 2024-07-16 ENCOUNTER — Other Ambulatory Visit (HOSPITAL_COMMUNITY)
Admission: RE | Admit: 2024-07-16 | Discharge: 2024-07-16 | Disposition: A | Source: Ambulatory Visit | Attending: Obstetrics & Gynecology | Admitting: Obstetrics & Gynecology

## 2024-07-16 ENCOUNTER — Encounter: Payer: Self-pay | Admitting: Obstetrics & Gynecology

## 2024-07-16 VITALS — BP 116/69 | HR 66 | Wt 261.0 lb

## 2024-07-16 DIAGNOSIS — N760 Acute vaginitis: Secondary | ICD-10-CM | POA: Insufficient documentation

## 2024-07-16 MED ORDER — FLUCONAZOLE 150 MG PO TABS: ORAL_TABLET | ORAL | 1 refills | Status: AC

## 2024-07-16 MED ORDER — HYDROXYZINE PAMOATE 25 MG PO CAPS: 25.0000 mg | ORAL_CAPSULE | Freq: Three times a day (TID) | ORAL | 6 refills | Status: AC | PRN

## 2024-07-16 MED ORDER — NYSTATIN-TRIAMCINOLONE 100000-0.1 UNIT/GM-% EX CREA: 1.0000 | TOPICAL_CREAM | Freq: Two times a day (BID) | CUTANEOUS | 6 refills | Status: AC

## 2024-07-16 NOTE — Progress Notes (Signed)
 Patient self-administered Depo 07/01/2024. Advised next depo is due Feb. 1st-15 th.

## 2024-07-16 NOTE — Progress Notes (Signed)
 GYNECOLOGY OFFICE VISIT NOTE  History:  Melinda Fox is a 48 y.o. F here today for continued vulvovaginitis attributed to yeast.  Has been treated with Nystatin  cream and Diflucan , these help but symptoms return. Has a lot of itching, alleviated by Atarax  at night. Wants to know what else to do.  Had a benign vulvar biopsy on 01/30/24, skin findings consistent with chronic yeast infection.  She is not sexually active.  She denies any abnormal vaginal bleeding, pelvic pain or other concerns.  Past Medical History:  Diagnosis Date   Allergy    Anemia    Chronic migraine without aura without status migrainosus, not intractable    Depression    Elevated blood-pressure reading without diagnosis of hypertension    Moderate concentric left ventricular hypertrophy 06/01/2017   Echocardiogram October 2018   Morbid obesity with body mass index (BMI) of 60.0 to 69.9 in adult [facility]) 07/01/2015   Scoliosis    Sleep apnea, obstructive    Thyroid  disease    Vasculitis     Past Surgical History:  Procedure Laterality Date   HERNIA REPAIR     SLEEVE GASTROPLASTY  05/25/2021   laporoscopic at [facility]    The following portions of the patient's history were reviewed and updated as appropriate: allergies, current medications, past family history, past medical history, past social history, past surgical history and problem list.   Health Maintenance:  Normal pap and negative HRHPV on 03/11/2023.  Normal mammogram on 08/16/2023.   Review of Systems:  Pertinent items noted in HPI and remainder of comprehensive ROS otherwise negative.  Physical Exam: Chaperone Neva Hyler, CMA  BP 116/69   Pulse 66   Wt 261 lb (118.4 kg)   BMI 39.68 kg/m  CONSTITUTIONAL: Well-developed, well-nourished female in no acute distress.  SKIN: No rash noted. Not diaphoretic. No erythema. No pallor. MUSCULOSKELETAL: Normal range of motion. No edema noted. NEUROLOGIC: Alert and oriented to person, place, and time. Normal  muscle tone coordination. No cranial nerve deficit noted on observation. PSYCHIATRIC: Normal mood and affect. Normal behavior. Normal judgment and thought content. CARDIOVASCULAR: Normal heart rate noted RESPIRATORY: Effort and breath sounds normal, no problems with respiration noted ABDOMEN: No masses or other overt distention noted on observation. No tenderness.   PELVIC: Diffuse excoriation and chronic inflammation changes noted on entire vulva and perineal region extending to bilateral upper thighs/bottom of gluteal muscles.  Normal architecture noted. Normal urethral meatus. White discharge noted, testing sample obtained.  Performed in the presence of a chaperone.  Assessment and Plan:    1. Recurrent vulvovaginitis (Primary) Testing done to evaluate for possible fluconazole  resistant strains of yeast, will follow up results and manage accordingly. Mycolog cream prescribed, hopefully this will help with external inflammation.  Atarax  refilled. Prolonged Diflucan  therapy prescribed. Proper vulvar hygiene emphasized: discussed avoidance of perfumed soaps, detergents, lotions and any type of douches; in addition to wearing cotton underwear and no underwear at night.   - Cervicovaginal ancillary only - nystatin -triamcinolone  (MYCOLOG II) cream; Apply 1 Application topically 2 (two) times daily. Apply to perineal area twice day for two weeks, then every other day  Dispense: 60 g; Refill: 6 - fluconazole  (DIFLUCAN ) 150 MG tablet; Take one tablet by mouth every three days for three doses, then take one tablet once a week for six months  Dispense: 30 tablet; Refill: 1 - hydrOXYzine  (VISTARIL ) 25 MG capsule; Take 1 capsule (25 mg total) by mouth every 8 (eight) hours as needed for itching.  Dispense: 90 capsule; Refill: 6  Routine preventative health maintenance measures emphasized. Please refer to After Visit Summary for other counseling recommendations.   Return for any gynecologic concerns.     I spent 30 minutes dedicated to the care of this patient including pre-visit review of records, face to face time with the patient discussing her conditions and treatments, post visit ordering of medications and appropriate tests or procedures, coordinating care and documenting this visit encounter.    GLORIS HUGGER, MD, FACOG Obstetrician & Gynecologist, [facility] for [employer], [facility]

## 2024-07-16 NOTE — Patient Instructions (Signed)
 Use sensitive soap formulation  Mostly water   No scrubbing!!!

## 2024-07-19 ENCOUNTER — Ambulatory Visit: Payer: Self-pay | Admitting: Obstetrics & Gynecology

## 2024-07-19 DIAGNOSIS — B9689 Other specified bacterial agents as the cause of diseases classified elsewhere: Secondary | ICD-10-CM

## 2024-07-19 LAB — CERVICOVAGINAL ANCILLARY ONLY
Bacterial Vaginitis (gardnerella): POSITIVE — AB
Candida Glabrata: NEGATIVE
Candida Vaginitis: NEGATIVE
Comment: NEGATIVE
Comment: NEGATIVE
Comment: NEGATIVE
Comment: NEGATIVE
Trichomonas: NEGATIVE

## 2024-07-19 MED ORDER — METRONIDAZOLE 500 MG PO TABS: 500.0000 mg | ORAL_TABLET | Freq: Two times a day (BID) | ORAL | 0 refills | Status: AC
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
## 2024-08-02 ENCOUNTER — Ambulatory Visit: Payer: Self-pay | Admitting: Psychiatry

## 2024-08-02 ENCOUNTER — Encounter: Payer: Self-pay | Admitting: Psychiatry

## 2024-08-02 ENCOUNTER — Other Ambulatory Visit: Payer: Self-pay

## 2024-08-02 VITALS — BP 110/62 | HR 81 | Temp 97.8°F | Ht 68.0 in | Wt 259.4 lb

## 2024-08-02 DIAGNOSIS — F331 Major depressive disorder, recurrent, moderate: Secondary | ICD-10-CM

## 2024-08-02 DIAGNOSIS — G4700 Insomnia, unspecified: Secondary | ICD-10-CM

## 2024-08-02 DIAGNOSIS — F909 Attention-deficit hyperactivity disorder, unspecified type: Secondary | ICD-10-CM | POA: Diagnosis not present

## 2024-08-02 MED ORDER — METHYLPHENIDATE HCL ER (OSM) 27 MG PO TBCR: 27.0000 mg | EXTENDED_RELEASE_TABLET | ORAL | 0 refills | Status: AC

## 2024-08-02 MED ORDER — METHYLPHENIDATE HCL ER 27 MG PO TB24: 27.0000 mg | ORAL_TABLET | Freq: Every day | ORAL | 0 refills | Status: AC

## 2024-08-02 NOTE — Patient Instructions (Signed)
 Increase Concerta  27 mg daily. Continue venlafaxine  150 mg daily  Hold ramelteon   Next appointment: 2/12 at 4:30

## 2024-08-03 ENCOUNTER — Telehealth: Payer: Self-pay

## 2024-08-03 NOTE — Telephone Encounter (Signed)
 Received fax for pior auth for methylphenidate  27 MG PO TB24  Key [id number] sent on 08/03/24

## 2024-08-10 NOTE — Telephone Encounter (Signed)
 Reached out to pharmacy they are resending as Prior Auth with other key did not go threw

## 2024-08-13 ENCOUNTER — Telehealth: Payer: Self-pay

## 2024-08-13 NOTE — Telephone Encounter (Signed)
 Prior authorization submitted for the patients Methylphenidate  27 mg ER tablets via CoverMyMeds sent to [facility] for approval  Approved Coverage 08-13-24//08-14-27  Called patient to make aware of the approval no answer

## 2024-08-22 NOTE — Telephone Encounter (Signed)
 Spoke to patient she was not aware that the medication had been approved she will call the pharmacy and see when she will be able to pick the medication up

## 2024-08-27 ENCOUNTER — Telehealth: Payer: Self-pay

## 2024-08-27 NOTE — Telephone Encounter (Signed)
 Prior authorization submitted for the patients Methylphenidate  HCI ER (OSM) 27 mg ER tablets via CoverMyMeds sent to [facility] for approval   Approved Coverage 08-27-24//08-28-27 PA case #[id number] Called patient to make aware of the approval no answer left voicemail for patient to call the office

## 2024-09-03 ENCOUNTER — Other Ambulatory Visit: Payer: Self-pay | Admitting: Gastroenterology

## 2024-09-03 DIAGNOSIS — R1319 Other dysphagia: Secondary | ICD-10-CM

## 2024-09-04 ENCOUNTER — Ambulatory Visit
Admission: RE | Admit: 2024-09-04 | Discharge: 2024-09-04 | Disposition: A | Discharge status: Home / Self Care | Source: Ambulatory Visit | Attending: Gastroenterology | Admitting: Gastroenterology

## 2024-09-04 DIAGNOSIS — R1319 Other dysphagia: Secondary | ICD-10-CM | POA: Insufficient documentation

## 2024-09-05 ENCOUNTER — Ambulatory Visit: Payer: Self-pay | Admitting: Gastroenterology

## 2024-09-11 DIAGNOSIS — G4700 Insomnia, unspecified: Secondary | ICD-10-CM

## 2024-09-11 MED ORDER — DOXEPIN HCL 6 MG PO TABS: 6.0000 mg | ORAL_TABLET | Freq: Every evening | ORAL | 0 refills | Status: AC | PRN

## 2024-09-11 NOTE — Telephone Encounter (Signed)
I've spent a total time of 9 minutes providing service to this patient-generated inquiry in the MyChart message  

## 2024-09-27 ENCOUNTER — Ambulatory Visit: Admitting: Psychiatry

## 2024-09-28 ENCOUNTER — Ambulatory Visit: Admit: 2024-09-28 | Admitting: Gastroenterology

## 2024-09-28 ENCOUNTER — Encounter: Admission: RE | Payer: Self-pay | Source: Home / Self Care
